# Patient Record
Sex: Female | Born: 1965 | Race: White | Hispanic: No | Marital: Single | Smoking: Never smoker
Health system: Southern US, Community
[De-identification: ages and names within clinical notes are randomized; demographics above are authoritative.]

## PROBLEM LIST (undated history)

## (undated) DIAGNOSIS — E0590 Thyrotoxicosis, unspecified without thyrotoxic crisis or storm: Secondary | ICD-10-CM

## (undated) DIAGNOSIS — D649 Anemia, unspecified: Secondary | ICD-10-CM

## (undated) DIAGNOSIS — Z800 Family history of malignant neoplasm of digestive organs: Secondary | ICD-10-CM

## (undated) DIAGNOSIS — Z9109 Other allergy status, other than to drugs and biological substances: Secondary | ICD-10-CM

## (undated) DIAGNOSIS — K760 Fatty (change of) liver, not elsewhere classified: Secondary | ICD-10-CM

## (undated) DIAGNOSIS — J45909 Unspecified asthma, uncomplicated: Secondary | ICD-10-CM

## (undated) DIAGNOSIS — R198 Other specified symptoms and signs involving the digestive system and abdomen: Secondary | ICD-10-CM

## (undated) DIAGNOSIS — R911 Solitary pulmonary nodule: Secondary | ICD-10-CM

## (undated) DIAGNOSIS — K219 Gastro-esophageal reflux disease without esophagitis: Secondary | ICD-10-CM

## (undated) DIAGNOSIS — E7800 Pure hypercholesterolemia, unspecified: Secondary | ICD-10-CM

## (undated) DIAGNOSIS — Z0000 Encounter for general adult medical examination without abnormal findings: Secondary | ICD-10-CM

## (undated) DIAGNOSIS — K8020 Calculus of gallbladder without cholecystitis without obstruction: Secondary | ICD-10-CM

## (undated) DIAGNOSIS — J069 Acute upper respiratory infection, unspecified: Secondary | ICD-10-CM

## (undated) DIAGNOSIS — E039 Hypothyroidism, unspecified: Secondary | ICD-10-CM

## (undated) DIAGNOSIS — R42 Dizziness and giddiness: Secondary | ICD-10-CM

## (undated) HISTORY — DX: Gastro-esophageal reflux disease without esophagitis: K21.9

## (undated) HISTORY — DX: Thyrotoxicosis, unspecified without thyrotoxic crisis or storm: E05.90

## (undated) HISTORY — DX: Other allergy status, other than to drugs and biological substances: Z91.09

## (undated) HISTORY — PX: NO PAST SURGERIES: SHX2092

## (undated) HISTORY — DX: Family history of malignant neoplasm of digestive organs: Z80.0

## (undated) HISTORY — DX: Acute upper respiratory infection, unspecified: J06.9

## (undated) HISTORY — DX: Fatty (change of) liver, not elsewhere classified: K76.0

## (undated) HISTORY — DX: Encounter for general adult medical examination without abnormal findings: Z00.00

## (undated) HISTORY — DX: Pure hypercholesterolemia, unspecified: E78.00

## (undated) HISTORY — DX: Other specified symptoms and signs involving the digestive system and abdomen: R19.8

## (undated) HISTORY — DX: Calculus of gallbladder without cholecystitis without obstruction: K80.20

## (undated) HISTORY — DX: Solitary pulmonary nodule: R91.1

## (undated) HISTORY — DX: Anemia, unspecified: D64.9

## (undated) HISTORY — DX: Dizziness and giddiness: R42

## (undated) HISTORY — DX: Unspecified asthma, uncomplicated: J45.909

## (undated) HISTORY — DX: Hypothyroidism, unspecified: E03.9

---

## 2004-04-24 ENCOUNTER — Ambulatory Visit: Payer: Self-pay | Admitting: Internal Medicine

## 2004-12-26 ENCOUNTER — Ambulatory Visit: Payer: Self-pay | Admitting: Gastroenterology

## 2005-06-12 ENCOUNTER — Ambulatory Visit: Payer: Self-pay | Admitting: Internal Medicine

## 2006-08-29 ENCOUNTER — Ambulatory Visit: Payer: Self-pay | Admitting: Internal Medicine

## 2007-09-09 ENCOUNTER — Ambulatory Visit: Payer: Self-pay | Admitting: Internal Medicine

## 2008-10-12 ENCOUNTER — Ambulatory Visit: Payer: Self-pay | Admitting: Internal Medicine

## 2009-09-14 ENCOUNTER — Ambulatory Visit: Payer: Self-pay | Admitting: Gastroenterology

## 2009-12-15 ENCOUNTER — Ambulatory Visit: Payer: Self-pay | Admitting: Internal Medicine

## 2010-12-18 ENCOUNTER — Ambulatory Visit: Payer: Self-pay | Admitting: Internal Medicine

## 2012-01-23 ENCOUNTER — Telehealth: Payer: Self-pay | Admitting: Internal Medicine

## 2012-01-23 ENCOUNTER — Ambulatory Visit (INDEPENDENT_AMBULATORY_CARE_PROVIDER_SITE_OTHER): Payer: Commercial Managed Care - PPO | Admitting: Internal Medicine

## 2012-01-23 ENCOUNTER — Encounter: Payer: Self-pay | Admitting: Internal Medicine

## 2012-01-23 VITALS — BP 126/75 | HR 69 | Temp 98.0°F | Ht 60.0 in | Wt 188.2 lb

## 2012-01-23 DIAGNOSIS — E039 Hypothyroidism, unspecified: Secondary | ICD-10-CM

## 2012-01-23 DIAGNOSIS — J069 Acute upper respiratory infection, unspecified: Secondary | ICD-10-CM

## 2012-01-23 MED ORDER — MONTELUKAST SODIUM 10 MG PO TABS: 10.0000 mg | ORAL_TABLET | Freq: Every day | ORAL | Status: DC

## 2012-01-23 MED ORDER — PREDNISONE 10 MG PO TABS: 10.0000 mg | ORAL_TABLET | Freq: Every day | ORAL | Status: DC

## 2012-01-23 MED ORDER — FLUTICASONE PROPIONATE 50 MCG/ACT NA SUSP: 2.0000 | Freq: Every day | NASAL | Status: DC

## 2012-01-23 MED ORDER — AMOXICILLIN-POT CLAVULANATE 875-125 MG PO TABS: 1.0000 | ORAL_TABLET | Freq: Two times a day (BID) | ORAL | Status: DC

## 2012-01-23 NOTE — Patient Instructions (Addendum)
It was nice seeing you today.  I am sorry you have not been feeling well.  I have sent your prescriptions to your pharmacy.  Let me know if any problems.

## 2012-01-23 NOTE — Telephone Encounter (Signed)
Pt called wanted to see if she could come in asap. She stated she has asthma    She is having a lot of chest congestion. Wheezing, she has to use her inhaler a lot more lately

## 2012-01-23 NOTE — Telephone Encounter (Signed)
Have her come on over now.

## 2012-01-27 ENCOUNTER — Encounter: Payer: Self-pay | Admitting: Internal Medicine

## 2012-01-27 DIAGNOSIS — E039 Hypothyroidism, unspecified: Secondary | ICD-10-CM

## 2012-01-27 HISTORY — DX: Hypothyroidism, unspecified: E03.9

## 2012-01-27 NOTE — Assessment & Plan Note (Signed)
On replacement.  Follow tsh.  

## 2012-01-27 NOTE — Progress Notes (Signed)
  Subjective:    Patient ID: Bethany Cook, female    DOB: 08-25-1965, 46 y.o.   MRN: [id number]  HPI 46 year old female with past history of asthma and hypothyroidism who comes in today as a work in with concerns regarding increased cough and congestion.  She noticed some changes with the weather change.  Subjective fever.  Taking mucinex the last several days.  Some increased sinus pressure, nasal congestion.  Increased cough and wheezing.  No significant chest tightness.  No vomiting or diarrhea.    Past Medical History  Diagnosis Date  . Asthma   . Hypothyroidism     Review of Systems Patient denies any headache, lightheadedness or dizziness.  Some sinus pressure and nasal congestion.  Some drainage.  No chest pain, tightness or palpitations.  No increased shortness of breath.  Does have the cough as outlined.  No vomiting.  No abdominal pain or cramping.  No bowel change, such as diarrhea, constipation, BRBPR or melana.  No urine change.        Objective:   Physical Exam Filed Vitals:   01/23/12 1615  BP: 126/75  Pulse: 69  Temp: 98 F (80.79 C)   46 year old female in no acute distress.   HEENT:  Nares - erythematous turbinates.  Minimal tenderness to palpation over the sinuses.  TMs visualized without erythema.   OP- without lesions or erythema.  NECK:  Supple, nontender.   HEART:  Appears to be regular. LUNGS:  Without crackles.  Some wheezing noted.  Increased air movement.  Some increased cough with forced expiration.   Respirations even and unlabored.       Assessment & Plan:  SINUSITIS/URI.  Known asthma.  Treat with Augmentin 875mg  bid x 10 days.  Flonase nasal spray and saline nasal flushes as directed.   Mucinex DM in the am and Robitussin DM in the evening.  Prednisone taper starting at 40mg  and decreasing by 5mg  each day until down to zero mg.  Pulmicort inhaler as directed.  Rescue inhaler and nebs as needed.  Follow.  Notify me if symptoms do not resolve.

## 2012-04-01 ENCOUNTER — Encounter: Payer: Self-pay | Admitting: Internal Medicine

## 2012-04-01 ENCOUNTER — Other Ambulatory Visit (HOSPITAL_COMMUNITY)
Admission: RE | Admit: 2012-04-01 | Discharge: 2012-04-01 | Disposition: A | Discharge status: Home / Self Care | Payer: Commercial Managed Care - PPO | Source: Ambulatory Visit | Attending: Internal Medicine | Admitting: Internal Medicine

## 2012-04-01 ENCOUNTER — Ambulatory Visit (INDEPENDENT_AMBULATORY_CARE_PROVIDER_SITE_OTHER): Payer: Commercial Managed Care - PPO | Admitting: Internal Medicine

## 2012-04-01 VITALS — BP 122/68 | HR 80 | Temp 98.7°F | Ht 60.0 in | Wt 190.5 lb

## 2012-04-01 DIAGNOSIS — J45909 Unspecified asthma, uncomplicated: Secondary | ICD-10-CM | POA: Insufficient documentation

## 2012-04-01 DIAGNOSIS — D649 Anemia, unspecified: Secondary | ICD-10-CM

## 2012-04-01 DIAGNOSIS — E7800 Pure hypercholesterolemia, unspecified: Secondary | ICD-10-CM

## 2012-04-01 DIAGNOSIS — R5383 Other fatigue: Secondary | ICD-10-CM

## 2012-04-01 DIAGNOSIS — Z1151 Encounter for screening for human papillomavirus (HPV): Secondary | ICD-10-CM | POA: Insufficient documentation

## 2012-04-01 DIAGNOSIS — Z139 Encounter for screening, unspecified: Secondary | ICD-10-CM

## 2012-04-01 DIAGNOSIS — Z01419 Encounter for gynecological examination (general) (routine) without abnormal findings: Secondary | ICD-10-CM | POA: Insufficient documentation

## 2012-04-01 DIAGNOSIS — R5381 Other malaise: Secondary | ICD-10-CM

## 2012-04-01 DIAGNOSIS — E039 Hypothyroidism, unspecified: Secondary | ICD-10-CM

## 2012-04-01 LAB — CBC WITH DIFFERENTIAL/PLATELET
Basophils Relative: 1.2 % (ref 0.0–3.0)
Eosinophils Absolute: 0.8 10*3/uL — ABNORMAL HIGH (ref 0.0–0.7)
Lymphs Abs: 1.4 10*3/uL (ref 0.7–4.0)
MCHC: 33.9 g/dL (ref 30.0–36.0)
MCV: 87.7 fl (ref 78.0–100.0)
Monocytes Absolute: 0.6 10*3/uL (ref 0.1–1.0)
Neutrophils Relative %: 66 % (ref 43.0–77.0)
Platelets: 285 10*3/uL (ref 150.0–400.0)

## 2012-04-01 LAB — FERRITIN: Ferritin: 36 ng/mL (ref 10.0–291.0)

## 2012-04-01 LAB — LIPID PANEL
HDL: 42.5 mg/dL (ref >39.00)
Triglycerides: 116 mg/dL (ref 0.0–149.0)
VLDL: 23.2 mg/dL (ref 0.0–40.0)

## 2012-04-01 LAB — COMPREHENSIVE METABOLIC PANEL
AST: 19 U/L (ref 0–37)
Albumin: 4.1 g/dL (ref 3.5–5.2)
Alkaline Phosphatase: 63 U/L (ref 39–117)
BUN: 10 mg/dL (ref 6–23)
Potassium: 4.2 mEq/L (ref 3.5–5.1)
Sodium: 136 mEq/L (ref 135–145)
Total Bilirubin: 0.5 mg/dL (ref 0.3–1.2)

## 2012-04-01 LAB — TSH: TSH: 0.87 u[IU]/mL (ref 0.35–5.50)

## 2012-04-01 LAB — LDL CHOLESTEROL, DIRECT: Direct LDL: 141.3 mg/dL

## 2012-04-01 MED ORDER — AMOXICILLIN-POT CLAVULANATE 875-125 MG PO TABS: 1.0000 | ORAL_TABLET | Freq: Two times a day (BID) | ORAL | Status: DC

## 2012-04-01 MED ORDER — PREDNISONE 10 MG PO TABS: ORAL_TABLET | ORAL | Status: DC

## 2012-04-06 ENCOUNTER — Encounter: Payer: Self-pay | Admitting: Internal Medicine

## 2012-04-06 NOTE — Assessment & Plan Note (Signed)
Treat the acute flare as outlined.  Follow.

## 2012-04-06 NOTE — Assessment & Plan Note (Signed)
Colonoscopy as outlined above.  EGD 09/14/09 - normal.  Follow.  Recheck cbc today.

## 2012-04-06 NOTE — Assessment & Plan Note (Signed)
Low cholesterol diet and exercise.  Check lipid panel.   

## 2012-04-06 NOTE — Assessment & Plan Note (Signed)
On thyroid replacement.  Check tsh.  

## 2012-04-06 NOTE — Progress Notes (Signed)
Subjective:    Patient ID: Bethany Cook, female    DOB: 03/22/1965, 47 y.o.   MRN: [id number]  HPI 47 year old female with past history of asthma and hypothyroidism who comes in today to follow up on these issues as well as for a complete physical exam.  She states she has been doing relatively well overall.  She has had issues with increased cough and congestion recently.  Using her neb at night and her rescue inhaler 3-4x/day.  Occasionally productive.  Some increased wheezing.  No nausea or vomiting.  No bowel change.  Handling stress well.    Past Medical History  Diagnosis Date  . Asthma   . Hyperthyroidism     s/p ablation  . Environmental allergies   . Anemia     iron deficient  . Hypercholesterolemia     Current Outpatient Prescriptions on File Prior to Visit  Medication Sig Dispense Refill  . amoxicillin-clavulanate (AUGMENTIN) 875-125 MG per tablet Take 1 tablet by mouth 2 (two) times daily.  20 tablet  0  . fexofenadine (ALLEGRA) 180 MG tablet Take 180 mg by mouth daily.      . fluticasone (FLONASE) 50 MCG/ACT nasal spray Place 2 sprays into the nose daily.  16 g  1  . fluticasone (FLOVENT HFA) 110 MCG/ACT inhaler Inhale 2 puffs into the lungs 2 (two) times daily.      Marland Kitchen levalbuterol (XOPENEX HFA) 45 MCG/ACT inhaler Inhale 2 puffs into the lungs every 6 (six) hours as needed.      Marland Kitchen levothyroxine (SYNTHROID, LEVOTHROID) 125 MCG tablet Take 125 mcg by mouth daily.      . montelukast (SINGULAIR) 10 MG tablet Take 1 tablet (10 mg total) by mouth at bedtime.  90 tablet  3  . predniSONE (DELTASONE) 10 MG tablet Take 1 tablet (10 mg total) by mouth daily. Take 4 tablets x 1 day and then decrease by 1/2 tablet each day until down to zero mg.  18 tablet  0    Review of Systems Patient denies any headache, lightheadedness or dizziness.  No significant sinus symptoms.   No chest pain, tightness or palpitations.  Does have the cough as outlined.  Increased wheezing.  using her inhalers  and neb more.  No vomiting.  No abdominal pain or cramping.  No bowel change, such as diarrhea, constipation, BRBPR or melana.  No urine change.  LMP - this months.       Objective:   Physical Exam  Filed Vitals:   04/01/12 0801  BP: 122/68  Pulse: 80  Temp: 98.7 F (53.74 C)   47 year old female in no acute distress.   HEENT:  Nares- clear.  Oropharynx - without lesions. NECK:  Supple.  Nontender.  No audible bruit.  HEART:  Appears to be regular. LUNGS:  No crackles or wheezing audible.  Respirations even and unlabored.  RADIAL PULSE:  Equal bilaterally.    BREASTS:  No nipple discharge or nipple retraction present.  Could not appreciate any distinct nodules or axillary adenopathy.  ABDOMEN:  Soft, nontender.  Bowel sounds present and normal.  No audible abdominal bruit.  GU:  Normal external genitalia.  Vaginal vault without lesions.  Cervix identified.  Pap performed. Could not appreciate any adnexal masses or tenderness.   RECTAL:  Heme negative.   EXTREMITIES:  No increased edema present.  DP pulses palpable and equal bilaterally.          Assessment & Plan:  URI.  Known asthma.  Flonase nasal spray and saline nasal flushes as directed.   Mucinex DM in the am and Robitussin DM in the evening.  Prednisone taper starting at 40mg  and decreasing by 5mg  each day until down to zero mg.  Pulmicort inhaler as directed.  Rescue inhaler and nebs as needed.  Follow.  Notify me if symptoms do not resolve.    PREVIOUS HEMATURIA.  Follow.   INCREASED PSYCHOSOCIAL STRESSORS.  Feels she is handling things well.  Follow.    FATIGUE.  Overall doing well.  Will check cbc,met c and ts.   HEALTH MAINTENANCE.  Physical today.  Schedule mammogram.  Colonoscopy 09/14/09 - diverticulosis otherwise normal.  Pap today.

## 2012-04-07 ENCOUNTER — Encounter: Payer: Self-pay | Admitting: Internal Medicine

## 2012-04-15 ENCOUNTER — Ambulatory Visit: Payer: Self-pay | Admitting: Internal Medicine

## 2012-04-26 ENCOUNTER — Other Ambulatory Visit: Payer: Self-pay

## 2012-04-28 ENCOUNTER — Encounter: Payer: Self-pay | Admitting: Internal Medicine

## 2012-05-05 ENCOUNTER — Encounter: Payer: Self-pay | Admitting: Internal Medicine

## 2012-05-08 ENCOUNTER — Telehealth: Payer: Self-pay | Admitting: Internal Medicine

## 2012-05-08 MED ORDER — LEVOTHYROXINE SODIUM 112 MCG PO TABS: 112.0000 ug | ORAL_TABLET | Freq: Every day | ORAL | Status: DC

## 2012-05-08 NOTE — Telephone Encounter (Signed)
rx sent in for levothyroxine

## 2012-06-13 ENCOUNTER — Ambulatory Visit (INDEPENDENT_AMBULATORY_CARE_PROVIDER_SITE_OTHER): Payer: Commercial Managed Care - PPO | Admitting: Internal Medicine

## 2012-06-13 ENCOUNTER — Telehealth: Payer: Self-pay | Admitting: Internal Medicine

## 2012-06-13 ENCOUNTER — Encounter: Payer: Self-pay | Admitting: Internal Medicine

## 2012-06-13 VITALS — BP 128/78 | HR 84 | Temp 99.0°F | Resp 18 | Wt 188.8 lb

## 2012-06-13 DIAGNOSIS — J45909 Unspecified asthma, uncomplicated: Secondary | ICD-10-CM

## 2012-06-13 MED ORDER — PREDNISONE 10 MG PO TABS: ORAL_TABLET | ORAL | Status: DC

## 2012-06-13 MED ORDER — ALBUTEROL SULFATE (2.5 MG/3ML) 0.083% IN NEBU
2.5000 mg | INHALATION_SOLUTION | Freq: Once | RESPIRATORY_TRACT | Status: AC
  Administered 2012-06-13: 2.5 mg via RESPIRATORY_TRACT

## 2012-06-13 MED ORDER — AMOXICILLIN-POT CLAVULANATE 875-125 MG PO TABS: 1.0000 | ORAL_TABLET | Freq: Two times a day (BID) | ORAL | Status: DC

## 2012-06-13 NOTE — Telephone Encounter (Signed)
Having a lot of congestion and breathing problems.  States Dr. Lorin Picket told her to let her know when this happens.  Dr. Lorin Picket has no openings this afternoon.  Pt refused to see NP and wants to hear back from Dr. Lorin Picket whether she can add her on today or if Dr. Lorin Picket will just call in her prednisone.  Please advise.

## 2012-06-13 NOTE — Telephone Encounter (Signed)
See if she can come on in now.

## 2012-06-13 NOTE — Telephone Encounter (Signed)
Called patient to see if she could come on in. Patient stated that she would be here.

## 2012-06-15 ENCOUNTER — Encounter: Payer: Self-pay | Admitting: Internal Medicine

## 2012-06-15 NOTE — Assessment & Plan Note (Signed)
Treat the underlying infection.  Follow.

## 2012-06-15 NOTE — Progress Notes (Addendum)
Subjective:    Patient ID: Bethany Cook, female    DOB: 1965/06/26, 47 y.o.   MRN: [id number]  Shortness of Breath  47 year old female with past history of asthma and hypothyroidism who comes in today as a work in with concerns regarding increased cough and congestion.  States symptoms started one week ago.  Increased chest congestion and cough.  Increased wheezing.  Taking mucinex for the last three days. Using her steroid inhaler bid.  Using her xopenx prn.  Using her neb at home - once a day for the last two days.  Some increased nasal congestion and drainage.     Past Medical History  Diagnosis Date  . Asthma   . Hyperthyroidism     s/p ablation  . Environmental allergies   . Anemia     iron deficient  . Hypercholesterolemia      Current Outpatient Prescriptions on File Prior to Visit  Medication Sig Dispense Refill  . ALPRAZolam (XANAX) 0.25 MG tablet Take 0.25 mg by mouth daily as needed.      . fexofenadine (ALLEGRA) 180 MG tablet Take 180 mg by mouth daily.      . fluticasone (FLONASE) 50 MCG/ACT nasal spray Place 2 sprays into the nose daily.  16 g  1  . fluticasone (FLOVENT HFA) 110 MCG/ACT inhaler Inhale 2 puffs into the lungs 2 (two) times daily.      Marland Kitchen levalbuterol (XOPENEX HFA) 45 MCG/ACT inhaler Inhale 2 puffs into the lungs every 6 (six) hours as needed.      Marland Kitchen levothyroxine (SYNTHROID) 112 MCG tablet Take 1 tablet (112 mcg total) by mouth daily.  90 tablet  3  . montelukast (SINGULAIR) 10 MG tablet Take 1 tablet (10 mg total) by mouth at bedtime.  90 tablet  3  . predniSONE (DELTASONE) 10 MG tablet Take 1 tablet (10 mg total) by mouth daily. Take 4 tablets x 1 day and then decrease by 1/2 tablet each day until down to zero mg.  18 tablet  0   No current facility-administered medications on file prior to visit.    Review of Systems  Respiratory: Positive for shortness of breath.   Patient denies any headache, lightheadedness or dizziness.  Some sinus pressure and  nasal congestion.  Some drainage.  No chest pain or palpitations.  Does have the cough as outlined.  Increased congestion and wheezing.  No acid reflux.   No vomiting.  No abdominal pain or cramping.  No bowel change, such as diarrhea, constipation, BRBPR or melana.  No urine change.        Objective:   Physical Exam  Filed Vitals:   06/13/12 1133  BP: 128/78  Pulse: 84  Temp: 99 F (37.2 C)  Resp: 45   47 year old female in no acute distress.   HEENT:  Nares - erythematous turbinates.  Minimal tenderness to palpation over the sinuses.  TMs visualized without erythema.   OP- without lesions or erythema.  NECK:  Supple, nontender.   HEART:  Appears to be regular. LUNGS:  Without crackles.  Some wheezing noted.  Increased air movement.  Some increased cough with forced expiration.         Assessment & Plan:  SINUSITIS/URI.  Known asthma.  Was given an albuterol neg here in the office.  Post neb pulse ox 98%.  Increased air movement.  Treat with Augmentin 875mg  bid x 10 days.  Flonase nasal spray and saline nasal flushes as directed.  Mucinex DM in the am and Robitussin DM in the evening.  Prednisone taper starting at 40mg  and decreasing by 5mg  each day until down to zero mg.  Pulmicort inhaler as directed.  Rescue inhaler and nebs as needed.  Follow.  Notify me if symptoms do not resolve.

## 2012-07-23 ENCOUNTER — Encounter: Payer: Self-pay | Admitting: Internal Medicine

## 2012-07-23 ENCOUNTER — Other Ambulatory Visit: Payer: Self-pay | Admitting: *Deleted

## 2012-07-23 MED ORDER — LEVALBUTEROL TARTRATE 45 MCG/ACT IN AERO: 2.0000 | INHALATION_SPRAY | Freq: Four times a day (QID) | RESPIRATORY_TRACT | Status: DC | PRN

## 2012-07-28 ENCOUNTER — Encounter: Payer: Self-pay | Admitting: Internal Medicine

## 2012-07-28 ENCOUNTER — Ambulatory Visit (INDEPENDENT_AMBULATORY_CARE_PROVIDER_SITE_OTHER): Payer: Commercial Managed Care - PPO | Admitting: Internal Medicine

## 2012-07-28 ENCOUNTER — Telehealth: Payer: Self-pay | Admitting: Internal Medicine

## 2012-07-28 ENCOUNTER — Ambulatory Visit: Payer: Self-pay | Admitting: Adult Health

## 2012-07-28 VITALS — BP 130/80 | HR 79 | Temp 99.6°F | Ht 60.0 in | Wt 189.2 lb

## 2012-07-28 DIAGNOSIS — R062 Wheezing: Secondary | ICD-10-CM

## 2012-07-28 DIAGNOSIS — J45909 Unspecified asthma, uncomplicated: Secondary | ICD-10-CM

## 2012-07-28 MED ORDER — ALBUTEROL SULFATE (2.5 MG/3ML) 0.083% IN NEBU
2.5000 mg | INHALATION_SOLUTION | Freq: Once | RESPIRATORY_TRACT | Status: AC
  Administered 2012-07-28: 2.5 mg via RESPIRATORY_TRACT

## 2012-07-28 MED ORDER — AMOXICILLIN-POT CLAVULANATE 875-125 MG PO TABS: 1.0000 | ORAL_TABLET | Freq: Two times a day (BID) | ORAL | Status: DC

## 2012-07-28 MED ORDER — PREDNISONE 10 MG PO TABS: ORAL_TABLET | ORAL | Status: DC

## 2012-07-28 NOTE — Telephone Encounter (Signed)
Patient Information:  Caller Name: Laycee  Phone: [phone]  Patient: Bethany, Cook  Gender: Female  DOB: 03/08/1966  Age: 47 Years  PCP: Dale Bell [facility]le  Pregnant: No  Office Follow Up:  Does the office need to follow up with this patient?: Yes  Instructions For The Office: PLEASE CALL PT BACK FOR SOONER APPT THAN 4PM ON 5-19 IF POSSIBLE DUE TO WHEEZING W/ ASTHMA  RN Note:  Next available appt scheduled  Symptoms  Reason For Call & Symptoms: ER CALL. Wheezing, Asthma Flare up  Reviewed Health History In EMR: N/A  Reviewed Medications In EMR: N/A  Reviewed Allergies In EMR: N/A  Reviewed Surgeries / Procedures: N/A  Date of Onset of Symptoms: 07/26/2012  Treatments Tried: Xopenex, Albuterol neb and Plumicort  Treatments Tried Worked: No OB / GYN:  LMP: Unknown  Guideline(s) Used:  Asthma Attack  Disposition Per Guideline:   Go to Office Now  Reason For Disposition Reached:   Moderate asthma attack (e.g., SOB at rest, speaks in phrases, audible wheezes) and not resolved after 2 nebulizer or inhaler treatments given 20 minutes apart  Advice Given:  N/A  Patient Will Follow Care Advice:  YES  Appointment Scheduled:  07/28/2012 16:00:00 Appointment Scheduled Provider:  Orville Govern

## 2012-07-29 ENCOUNTER — Encounter: Payer: Self-pay | Admitting: Internal Medicine

## 2012-07-29 ENCOUNTER — Ambulatory Visit (INDEPENDENT_AMBULATORY_CARE_PROVIDER_SITE_OTHER): Payer: Commercial Managed Care - PPO | Admitting: Internal Medicine

## 2012-07-29 VITALS — BP 130/90 | HR 86 | Temp 98.7°F | Ht 60.0 in | Wt 189.2 lb

## 2012-07-29 DIAGNOSIS — J45909 Unspecified asthma, uncomplicated: Secondary | ICD-10-CM

## 2012-07-29 NOTE — Progress Notes (Signed)
Subjective:    Patient ID: Bethany Cook, female    DOB: May 07, 1965, 47 y.o.   MRN: [id number]  Wheezing  Associated symptoms include shortness of breath.  Shortness of Breath Associated symptoms include wheezing.  47 year old female with past history of asthma and hypothyroidism who comes in today as a work in with concerns regarding increased cough and congestion and wheezing.  States symptoms started three days ago.  Increased chest congestion and cough.  Increased wheezing.  Taking mucinex for the last three days. Using her steroid inhaler bid.  Using her xopenx prn.  Using her neb at home.  Some increased nasal congestion and drainage.  Chest feels tight.  No vomiting.     Past Medical History  Diagnosis Date  . Asthma   . Hyperthyroidism     s/p ablation  . Environmental allergies   . Anemia     iron deficient  . Hypercholesterolemia      Current Outpatient Prescriptions on File Prior to Visit  Medication Sig Dispense Refill  . ALPRAZolam (XANAX) 0.25 MG tablet Take 0.25 mg by mouth daily as needed.      . fexofenadine (ALLEGRA) 180 MG tablet Take 180 mg by mouth daily.      . fluticasone (FLONASE) 50 MCG/ACT nasal spray Place 2 sprays into the nose daily.  16 g  1  . levalbuterol (XOPENEX HFA) 45 MCG/ACT inhaler Inhale 2 puffs into the lungs every 6 (six) hours as needed.  3 Inhaler  0  . levothyroxine (SYNTHROID) 112 MCG tablet Take 1 tablet (112 mcg total) by mouth daily.  90 tablet  3  . montelukast (SINGULAIR) 10 MG tablet Take 1 tablet (10 mg total) by mouth at bedtime.  90 tablet  3  . fluticasone (FLOVENT HFA) 110 MCG/ACT inhaler Inhale 2 puffs into the lungs 2 (two) times daily.       No current facility-administered medications on file prior to visit.    Review of Systems  Respiratory: Positive for shortness of breath and wheezing.   Patient denies any headache, lightheadedness or dizziness.  Some sinus pressure and nasal congestion.  Some drainage.  No chest pain  or palpitations.  Does have the cough as outlined.  Increased congestion and wheezing.  Chest tightness.  No acid reflux.   No vomiting.  No abdominal pain or cramping.  No bowel change, such as diarrhea, constipation, BRBPR or melana.  No urine change.        Objective:   Physical Exam  Filed Vitals:   07/28/12 1528  BP: 130/80  Pulse: 79  Temp: 99.6 F (20.45 C)   47 year old female in no acute distress.   HEENT:  Nares - erythematous turbinates.  Minimal tenderness to palpation over the sinuses.  TMs visualized without erythema.   OP- without lesions or erythema.  NECK:  Supple, nontender.   HEART:  Appears to be regular. LUNGS:  Without crackles.  Some wheezing noted.  Increased congestion and cough.  Some increased cough with forced expiration.         Assessment & Plan:  SINUSITIS/URI.  Known asthma.  Was given an albuterol neg here in the office.  Post neb pulse ox 97%.  Increased air movement.  Treat with Augmentin 875mg  bid x 10 days.  Flonase nasal spray and saline nasal flushes as directed.   Mucinex DM in the am and Robitussin DM in the evening.  Prednisone taper starting at 40mg  and decreasing by 5mg   each day until down to zero mg.  Pulmicort inhaler as directed.  Rescue inhaler and nebs as needed.  Follow.  Notify me if symptoms do not resolve.  Will reevaluate her tomorrow to confirm improvement.

## 2012-07-29 NOTE — Assessment & Plan Note (Signed)
Treat current infection and flare.  Follow.

## 2012-08-03 ENCOUNTER — Encounter: Payer: Self-pay | Admitting: Internal Medicine

## 2012-08-03 NOTE — Assessment & Plan Note (Signed)
Treating the acute flare as outlined.  Follow.  Breathing better.

## 2012-08-03 NOTE — Progress Notes (Signed)
Subjective:    Patient ID: Bethany Cook, female    DOB: [birth year], 47 y.o.   MRN: [id number]  Wheezing  Associated symptoms include shortness of breath.  Shortness of Breath Associated symptoms include wheezing.  47 year old female with past history of asthma and hypothyroidism who comes in today for a follow up regarding increased cough and congestion and wheezing. She was seen yesterday.  Started on prednisone and abx.  Using her nebulizer and inhalers.  Feels better.  Breathing better.  Increased air movement.  No nausea or vomiting.  Now bowel change.    Past Medical History  Diagnosis Date  . Asthma   . Hyperthyroidism     s/p ablation  . Environmental allergies   . Anemia     iron deficient  . Hypercholesterolemia     Current Outpatient Prescriptions on File Prior to Visit  Medication Sig Dispense Refill  . ALPRAZolam (XANAX) 0.25 MG tablet Take 0.25 mg by mouth daily as needed.      Marland Kitchen amoxicillin-clavulanate (AUGMENTIN) 875-125 MG per tablet Take 1 tablet by mouth 2 (two) times daily.  20 tablet  0  . budesonide (PULMICORT) 180 MCG/ACT inhaler Inhale 1 puff into the lungs 2 (two) times daily.      . fexofenadine (ALLEGRA) 180 MG tablet Take 180 mg by mouth daily.      . fluticasone (FLONASE) 50 MCG/ACT nasal spray Place 2 sprays into the nose daily.  16 g  1  . fluticasone (FLOVENT HFA) 110 MCG/ACT inhaler Inhale 2 puffs into the lungs 2 (two) times daily.      Marland Kitchen levalbuterol (XOPENEX HFA) 45 MCG/ACT inhaler Inhale 2 puffs into the lungs every 6 (six) hours as needed.  3 Inhaler  0  . levothyroxine (SYNTHROID) 112 MCG tablet Take 1 tablet (112 mcg total) by mouth daily.  90 tablet  3  . montelukast (SINGULAIR) 10 MG tablet Take 1 tablet (10 mg total) by mouth at bedtime.  90 tablet  3  . predniSONE (DELTASONE) 10 MG tablet Take 4 tablets x 1 day and then decrease by 1/2 tablet per day until down to zero mg  18 tablet  0   No current facility-administered medications on file  prior to visit.    Review of Systems  Respiratory: Positive for shortness of breath and wheezing.   Patient denies any headache, lightheadedness or dizziness.  Some sinus pressure and nasal congestion.  Some drainage.  No chest pain or palpitations.  Cough is better.  Breathing better.   No acid reflux.   No vomiting.  No abdominal pain or cramping.  No bowel change, such as diarrhea, constipation, BRBPR or melana.        Objective:   Physical Exam  Filed Vitals:   07/29/12 1149  BP: 130/90  Pulse: 86  Temp: 98.7 F (79.26 C)   47 year old female in no acute distress.   HEENT:  Nares - erythematous turbinates.  TMs visualized without erythema.   OP- without lesions or erythema.  NECK:  Supple, nontender.   HEART:  Appears to be regular. LUNGS:  Without crackles.  Increased air movement.        Assessment & Plan:  SINUSITIS/URI.  Known asthma.  Was given an albuterol neg here in the office yesterday.   Was placed on Augmentin 875mg  bid.   Flonase nasal spray and saline nasal flushes as directed.   Mucinex DM in the am and Robitussin DM in the evening.  Prednisone taper starting at 40mg  and decreasing by 5mg  each day until down to zero mg.  Pulmicort inhaler as directed.  Rescue inhaler and nebs as needed.  Doing better now.  Breathing better.  Follow.

## 2012-09-23 ENCOUNTER — Encounter: Payer: Self-pay | Admitting: Internal Medicine

## 2012-09-23 ENCOUNTER — Other Ambulatory Visit: Payer: Self-pay | Admitting: *Deleted

## 2012-09-23 MED ORDER — LEVALBUTEROL TARTRATE 45 MCG/ACT IN AERO: 2.0000 | INHALATION_SPRAY | Freq: Four times a day (QID) | RESPIRATORY_TRACT | Status: DC | PRN

## 2012-09-30 ENCOUNTER — Ambulatory Visit: Payer: Commercial Managed Care - PPO | Admitting: Internal Medicine

## 2012-10-06 ENCOUNTER — Ambulatory Visit: Payer: Commercial Managed Care - PPO | Admitting: Internal Medicine

## 2012-10-08 ENCOUNTER — Ambulatory Visit (INDEPENDENT_AMBULATORY_CARE_PROVIDER_SITE_OTHER): Payer: Commercial Managed Care - PPO | Admitting: Internal Medicine

## 2012-10-08 ENCOUNTER — Encounter: Payer: Self-pay | Admitting: Internal Medicine

## 2012-10-08 VITALS — BP 120/80 | HR 73 | Temp 98.5°F | Ht 60.0 in | Wt 190.0 lb

## 2012-10-08 DIAGNOSIS — J45909 Unspecified asthma, uncomplicated: Secondary | ICD-10-CM

## 2012-10-08 DIAGNOSIS — D649 Anemia, unspecified: Secondary | ICD-10-CM

## 2012-10-08 DIAGNOSIS — E039 Hypothyroidism, unspecified: Secondary | ICD-10-CM

## 2012-10-08 DIAGNOSIS — E7800 Pure hypercholesterolemia, unspecified: Secondary | ICD-10-CM

## 2012-10-08 LAB — LIPID PANEL
Cholesterol: 214 mg/dL — ABNORMAL HIGH (ref 0–200)
HDL: 46.4 mg/dL (ref >39.00)
VLDL: 21.6 mg/dL (ref 0.0–40.0)

## 2012-10-08 NOTE — Progress Notes (Signed)
  Subjective:    Patient ID: Bethany Cook, female    DOB: Jul 23, 1965, 47 y.o.   MRN: [id number]  HPI 47 year old female with past history of asthma and hypothyroidism who comes in today for a scheduled follow up.   She states she has been doing relatively well overall.  She has had issues with increased cough and congestion recently.  Using her neb at night and her rescue inhaler 3-4x/day.  Occasionally productive.  Some increased wheezing.  No nausea or vomiting.  No bowel change.  Handling stress well.    Past Medical History  Diagnosis Date  . Asthma   . Hyperthyroidism     s/p ablation  . Environmental allergies   . Anemia     iron deficient  . Hypercholesterolemia     Current Outpatient Prescriptions on File Prior to Visit  Medication Sig Dispense Refill  . budesonide (PULMICORT) 180 MCG/ACT inhaler Inhale 1 puff into the lungs 2 (two) times daily.      . fexofenadine (ALLEGRA) 180 MG tablet Take 180 mg by mouth daily.      . fluticasone (FLONASE) 50 MCG/ACT nasal spray Place 2 sprays into the nose daily.  16 g  1  . fluticasone (FLOVENT HFA) 110 MCG/ACT inhaler Inhale 2 puffs into the lungs 2 (two) times daily.      Marland Kitchen levalbuterol (XOPENEX HFA) 45 MCG/ACT inhaler Inhale 2 puffs into the lungs every 6 (six) hours as needed.  3 Inhaler  0  . levothyroxine (SYNTHROID) 112 MCG tablet Take 1 tablet (112 mcg total) by mouth daily.  90 tablet  3  . montelukast (SINGULAIR) 10 MG tablet Take 1 tablet (10 mg total) by mouth at bedtime.  90 tablet  3   No current facility-administered medications on file prior to visit.    Review of Systems Patient denies any headache, lightheadedness or dizziness.  No significant sinus symptoms.   No chest pain, tightness or palpitations.  Does have the cough as outlined.  Increased wheezing.  using her inhalers and neb more.  No vomiting.  No abdominal pain or cramping.  No bowel change, such as diarrhea, constipation, BRBPR or melana.  No urine change.   LMP - this months.       Objective:   Physical Exam  Filed Vitals:   10/08/12 0757  BP: 120/80  Pulse: 73  Temp: 98.5 F (76.47 C)   47 year old female in no acute distress.   HEENT:  Nares- clear.  Oropharynx - without lesions. NECK:  Supple.  Nontender.  No audible bruit.  HEART:  Appears to be regular. LUNGS:  No crackles or wheezing audible.  Respirations even and unlabored.  RADIAL PULSE:  Equal bilaterally.   ABDOMEN:  Soft, nontender.  Bowel sounds present and normal.  No audible abdominal bruit.   EXTREMITIES:  No increased edema present.  DP pulses palpable and equal bilaterally.          Assessment & Plan:  PREVIOUS HEMATURIA.  Follow.   INCREASED PSYCHOSOCIAL STRESSORS.  Feels she is handling things well.  Follow.    HEALTH MAINTENANCE.  Physical 04/01/12.  Mammogram 04/15/12 - Birads I.  Colonoscopy 09/14/09 - diverticulosis otherwise normal.

## 2012-10-10 ENCOUNTER — Encounter: Payer: Self-pay | Admitting: Internal Medicine

## 2012-10-11 ENCOUNTER — Encounter: Payer: Self-pay | Admitting: Internal Medicine

## 2012-10-11 NOTE — Assessment & Plan Note (Signed)
On replacement.  Follow tsh.  Last tsh wnl.   

## 2012-10-11 NOTE — Assessment & Plan Note (Signed)
Colonoscopy as outlined above.  EGD 09/14/09 - normal.  Follow. Last hgb normal.

## 2012-10-11 NOTE — Assessment & Plan Note (Signed)
Despite her current med regimen, she is still having flares of intermittent cough and wheezing.  Will get her set up with an allergist for further evaluation and treatment.

## 2012-10-11 NOTE — Assessment & Plan Note (Signed)
Low cholesterol diet and exercise.  Check lipid panel.   

## 2012-10-20 ENCOUNTER — Encounter: Payer: Self-pay | Admitting: Internal Medicine

## 2012-10-29 ENCOUNTER — Other Ambulatory Visit: Payer: Self-pay | Admitting: *Deleted

## 2012-10-29 MED ORDER — LEVOTHYROXINE SODIUM 112 MCG PO TABS: 112.0000 ug | ORAL_TABLET | Freq: Every day | ORAL | Status: DC

## 2012-11-05 ENCOUNTER — Encounter: Payer: Self-pay | Admitting: Internal Medicine

## 2012-11-05 ENCOUNTER — Telehealth: Payer: Self-pay | Admitting: Internal Medicine

## 2012-11-05 NOTE — Telephone Encounter (Signed)
My chart message sent to assess pts clinical status.

## 2013-01-15 ENCOUNTER — Other Ambulatory Visit: Payer: Self-pay

## 2013-03-10 ENCOUNTER — Other Ambulatory Visit: Payer: Self-pay | Admitting: Internal Medicine

## 2013-04-02 ENCOUNTER — Other Ambulatory Visit: Payer: Self-pay | Admitting: *Deleted

## 2013-04-02 ENCOUNTER — Telehealth: Payer: Self-pay | Admitting: Internal Medicine

## 2013-04-02 MED ORDER — LEVOTHYROXINE SODIUM 112 MCG PO TABS: 112.0000 ug | ORAL_TABLET | Freq: Every day | ORAL | Status: DC

## 2013-04-02 NOTE — Telephone Encounter (Signed)
Pt states she has called several times regarding her levothyroxine.  She has called her mail order, [facility], and they do not have it.  She states the pharmacy did not have it but told her they would contact us today with the request.  Pt states she does not have enough left to get her through until it comes through mail.  Asking for samples or if a short supply can be called to [facility].  Pt asking for a call to let her know what can be done.

## 2013-04-02 NOTE — Telephone Encounter (Signed)
Left detailed message to notify pt that her Rx was sent to pharmacy on 10/29/12, however I called [facility] they did not fill it for whatever reason. They are going to rush it to her. I sent in a [facility] day supply [facility]. I also notified patient that I never received any messages from her until today. Last form of communication received was a Clinical cytogeneticistmychart message in August.

## 2013-04-20 ENCOUNTER — Encounter: Payer: Self-pay | Admitting: Internal Medicine

## 2013-04-20 ENCOUNTER — Other Ambulatory Visit: Payer: Self-pay | Admitting: Internal Medicine

## 2013-04-20 ENCOUNTER — Ambulatory Visit (INDEPENDENT_AMBULATORY_CARE_PROVIDER_SITE_OTHER): Payer: Commercial Managed Care - PPO | Admitting: Internal Medicine

## 2013-04-20 ENCOUNTER — Encounter: Payer: Self-pay | Admitting: Emergency Medicine

## 2013-04-20 VITALS — BP 118/80 | HR 74 | Temp 98.7°F | Ht 62.25 in | Wt 199.2 lb

## 2013-04-20 DIAGNOSIS — K219 Gastro-esophageal reflux disease without esophagitis: Secondary | ICD-10-CM

## 2013-04-20 DIAGNOSIS — J45909 Unspecified asthma, uncomplicated: Secondary | ICD-10-CM

## 2013-04-20 DIAGNOSIS — E7800 Pure hypercholesterolemia, unspecified: Secondary | ICD-10-CM

## 2013-04-20 DIAGNOSIS — Z1239 Encounter for other screening for malignant neoplasm of breast: Secondary | ICD-10-CM

## 2013-04-20 DIAGNOSIS — Z800 Family history of malignant neoplasm of digestive organs: Secondary | ICD-10-CM

## 2013-04-20 DIAGNOSIS — E039 Hypothyroidism, unspecified: Secondary | ICD-10-CM

## 2013-04-20 DIAGNOSIS — D649 Anemia, unspecified: Secondary | ICD-10-CM

## 2013-04-20 NOTE — Progress Notes (Signed)
Orders placed for labs

## 2013-04-20 NOTE — Progress Notes (Signed)
Pre-visit discussion using our clinic review tool. No additional management support is needed unless otherwise documented below in the visit note.  

## 2013-04-21 LAB — COMPREHENSIVE METABOLIC PANEL
ALT: 21 U/L (ref 0–35)
AST: 23 U/L (ref 0–37)
Albumin: 4 g/dL (ref 3.5–5.2)
Alkaline Phosphatase: 56 U/L (ref 39–117)
BUN: 8 mg/dL (ref 6–23)
CALCIUM: 9 mg/dL (ref 8.4–10.5)
CHLORIDE: 103 meq/L (ref 96–112)
CO2: 27 meq/L (ref 19–32)
Creatinine, Ser: 0.9 mg/dL (ref 0.4–1.2)
GFR: 72.38 mL/min (ref >60.00)
Glucose, Bld: 91 mg/dL (ref 70–99)
Potassium: 4 mEq/L (ref 3.5–5.1)
SODIUM: 138 meq/L (ref 135–145)
Total Bilirubin: 0.5 mg/dL (ref 0.3–1.2)
Total Protein: 7.6 g/dL (ref 6.0–8.3)

## 2013-04-21 LAB — CBC WITH DIFFERENTIAL/PLATELET
BASOS ABS: 0.1 10*3/uL (ref 0.0–0.1)
Basophils Relative: 0.7 % (ref 0.0–3.0)
Eosinophils Absolute: 0.2 10*3/uL (ref 0.0–0.7)
Eosinophils Relative: 1.7 % (ref 0.0–5.0)
HCT: 41.2 % (ref 36.0–46.0)
HEMOGLOBIN: 13.4 g/dL (ref 12.0–15.0)
LYMPHS PCT: 24.4 % (ref 12.0–46.0)
Lymphs Abs: 2.2 10*3/uL (ref 0.7–4.0)
MCHC: 32.4 g/dL (ref 30.0–36.0)
MCV: 88.6 fl (ref 78.0–100.0)
MONOS PCT: 7.3 % (ref 3.0–12.0)
Monocytes Absolute: 0.7 10*3/uL (ref 0.1–1.0)
NEUTROS ABS: 6 10*3/uL (ref 1.4–7.7)
Neutrophils Relative %: 65.9 % (ref 43.0–77.0)
Platelets: 322 10*3/uL (ref 150.0–400.0)
RBC: 4.66 Mil/uL (ref 3.87–5.11)
RDW: 13.2 % (ref 11.5–14.6)
WBC: 9.1 10*3/uL (ref 4.5–10.5)

## 2013-04-21 LAB — LIPID PANEL
Cholesterol: 220 mg/dL — ABNORMAL HIGH (ref 0–200)
HDL: 48.5 mg/dL (ref >39.00)
Total CHOL/HDL Ratio: 5
Triglycerides: 85 mg/dL (ref 0.0–149.0)
VLDL: 17 mg/dL (ref 0.0–40.0)

## 2013-04-21 LAB — LDL CHOLESTEROL, DIRECT: Direct LDL: 157.7 mg/dL

## 2013-04-21 LAB — TSH: TSH: 1.88 u[IU]/mL (ref 0.35–5.50)

## 2013-04-22 ENCOUNTER — Encounter: Payer: Self-pay | Admitting: Internal Medicine

## 2013-04-22 DIAGNOSIS — E7800 Pure hypercholesterolemia, unspecified: Secondary | ICD-10-CM

## 2013-04-25 ENCOUNTER — Encounter: Payer: Self-pay | Admitting: Internal Medicine

## 2013-04-25 DIAGNOSIS — K219 Gastro-esophageal reflux disease without esophagitis: Secondary | ICD-10-CM | POA: Insufficient documentation

## 2013-04-25 DIAGNOSIS — Z800 Family history of malignant neoplasm of digestive organs: Secondary | ICD-10-CM | POA: Insufficient documentation

## 2013-04-25 HISTORY — DX: Gastro-esophageal reflux disease without esophagitis: K21.9

## 2013-04-25 HISTORY — DX: Family history of malignant neoplasm of digestive organs: Z80.0

## 2013-04-25 NOTE — Assessment & Plan Note (Signed)
Breathing better on her current regimen.  Receiving allergy shots.  Follow.    

## 2013-04-25 NOTE — Assessment & Plan Note (Signed)
Reflux as outlined.  Better on generic prilosec.  Add ranitidine.  Follow.  EGD 2011 - normal.

## 2013-04-25 NOTE — Progress Notes (Signed)
  Subjective:    Patient ID: Bethany Cook, female    DOB: [birth year], 48 y.o.   MRN: [id number]  HPI 48 year old female with past history of asthma and hypothyroidism who comes in today to follow up on these issues as well as for a complete physical exam.  States her breathing has been better.  Receiving allergy shots now.  Off flonase.  Using pro air and symbicort.  On prilosec.  Still some occasional break through reflux.  No significant problems.   No nausea or vomiting.  No bowel change.  Some increased stress.  She did not want to discuss.  She feels she is handling things relatively well.  Does report some fatigue.  She has recently started going to the gym.     Past Medical History  Diagnosis Date  . Asthma   . Hyperthyroidism     s/p ablation  . Environmental allergies   . Anemia     iron deficient  . Hypercholesterolemia     Current Outpatient Prescriptions on File Prior to Visit  Medication Sig Dispense Refill  . levothyroxine (SYNTHROID) 112 MCG tablet Take 1 tablet (112 mcg total) by mouth daily.  7 tablet  0  . montelukast (SINGULAIR) 10 MG tablet Take 1 tablet (10 mg total) by mouth at bedtime.  90 tablet  1   No current facility-administered medications on file prior to visit.    Review of Systems Patient denies any headache, lightheadedness or dizziness.  No significant sinus symptoms.   No chest pain, tightness or palpitations.  Breathing better on above regimen.   No vomiting. Acid reflux as outlined.   No abdominal pain or cramping.  No bowel change, such as diarrhea, constipation, BRBPR or melana.  No urine change.  Increased stress.  Some fatigue.     Objective:   Physical Exam  Filed Vitals:   04/20/13 0840  BP: 118/80  Pulse: 74  Temp: 98.7 F (5237.751 C)   48 year old female in no acute distress.   HEENT:  Nares- clear.  Oropharynx - without lesions. NECK:  Supple.  Nontender.  No audible bruit.  HEART:  Appears to be regular. LUNGS:  No crackles or  wheezing audible.  Respirations even and unlabored.  RADIAL PULSE:  Equal bilaterally.    BREASTS:  No nipple discharge or nipple retraction present.  Could not appreciate any distinct nodules or axillary adenopathy.  ABDOMEN:  Soft, nontender.  Bowel sounds present and normal.  No audible abdominal bruit.  GU:  Not performed.    EXTREMITIES:  No increased edema present.  DP pulses palpable and equal bilaterally.          Assessment & Plan:  PREVIOUS HEMATURIA.  Follow.   INCREASED PSYCHOSOCIAL STRESSORS.  Feels she is handling things relatively well.  Follow.    HEALTH MAINTENANCE.  Physical today.  Pap negative with negative HPV - 04/01/12.  Mammogram 04/15/12 - Birads I.  Schedule a f/u mammogram.   Colonoscopy 09/14/09 - diverticulosis otherwise normal.

## 2013-04-25 NOTE — Assessment & Plan Note (Signed)
On replacement.  Follow tsh.  Last tsh wnl.   

## 2013-04-25 NOTE — Assessment & Plan Note (Signed)
Colonoscopy as outlined above.  EGD 09/14/09 - normal.  Follow.  Last hgb normal.  Follow.    

## 2013-04-25 NOTE — Assessment & Plan Note (Signed)
Father had colon cancer.  Colonoscopy 09/14/2009 - normal.  Due f/u colonoscopy in five years.

## 2013-04-25 NOTE — Assessment & Plan Note (Signed)
Low cholesterol diet and exercise.  Check lipid panel.   

## 2013-04-27 NOTE — Telephone Encounter (Signed)
Mailed unread [employer] message, requested pt to contact office or reply to her [employer] email message

## 2013-05-04 MED ORDER — PRAVASTATIN SODIUM 10 MG PO TABS: 10.0000 mg | ORAL_TABLET | Freq: Every day | ORAL | Status: DC

## 2013-05-04 NOTE — Addendum Note (Signed)
Addended by: Charm BargesSCOTT, Heavan Francom S on: 05/04/2013 04:00 PM   Modules accepted: Orders

## 2013-05-04 NOTE — Telephone Encounter (Signed)
Pt needs a non fasting lab appt in 6 weeks.  Please contact her with a lab appt date and time.  Thanks.

## 2013-05-13 ENCOUNTER — Other Ambulatory Visit: Payer: Self-pay | Admitting: Internal Medicine

## 2013-05-13 ENCOUNTER — Ambulatory Visit: Payer: Self-pay | Admitting: Internal Medicine

## 2013-05-13 LAB — HM MAMMOGRAPHY: HM MAMMO: NEGATIVE

## 2013-05-16 ENCOUNTER — Encounter: Payer: Self-pay | Admitting: Internal Medicine

## 2013-05-18 ENCOUNTER — Telehealth: Payer: Self-pay | Admitting: *Deleted

## 2013-05-18 MED ORDER — BUDESONIDE 180 MCG/ACT IN AEPB
1.0000 | INHALATION_SPRAY | Freq: Two times a day (BID) | RESPIRATORY_TRACT | Status: DC
Start: 2013-05-18 — End: 2013-08-18

## 2013-05-18 NOTE — Telephone Encounter (Signed)
Okay to refill? Not on med list. 

## 2013-05-18 NOTE — Telephone Encounter (Signed)
Refill Request   Pulmicort Flexhaler 180 mcg   Use one puff two times daily

## 2013-05-18 NOTE — Telephone Encounter (Signed)
Ok to refill x 4 

## 2013-06-10 ENCOUNTER — Encounter: Payer: Self-pay | Admitting: Internal Medicine

## 2013-06-17 ENCOUNTER — Other Ambulatory Visit (INDEPENDENT_AMBULATORY_CARE_PROVIDER_SITE_OTHER): Payer: Commercial Managed Care - PPO

## 2013-06-17 DIAGNOSIS — E7800 Pure hypercholesterolemia, unspecified: Secondary | ICD-10-CM

## 2013-06-17 LAB — HEPATIC FUNCTION PANEL
ALT: 18 U/L (ref 0–35)
AST: 20 U/L (ref 0–37)
Albumin: 4 g/dL (ref 3.5–5.2)
Alkaline Phosphatase: 56 U/L (ref 39–117)
BILIRUBIN DIRECT: 0 mg/dL (ref 0.0–0.3)
Total Bilirubin: 0.3 mg/dL (ref 0.3–1.2)
Total Protein: 7.6 g/dL (ref 6.0–8.3)

## 2013-06-18 ENCOUNTER — Encounter: Payer: Self-pay | Admitting: Internal Medicine

## 2013-07-04 ENCOUNTER — Other Ambulatory Visit: Payer: Self-pay | Admitting: Internal Medicine

## 2013-07-07 ENCOUNTER — Encounter: Payer: Self-pay | Admitting: Internal Medicine

## 2013-07-07 ENCOUNTER — Other Ambulatory Visit: Payer: Self-pay | Admitting: *Deleted

## 2013-07-07 MED ORDER — PRAVASTATIN SODIUM 10 MG PO TABS: ORAL_TABLET | ORAL | Status: DC

## 2013-07-22 ENCOUNTER — Other Ambulatory Visit: Payer: Self-pay | Admitting: Internal Medicine

## 2013-07-23 ENCOUNTER — Other Ambulatory Visit: Payer: Self-pay | Admitting: *Deleted

## 2013-07-23 MED ORDER — LEVOTHYROXINE SODIUM 112 MCG PO TABS: 112.0000 ug | ORAL_TABLET | Freq: Every day | ORAL | Status: DC

## 2013-08-18 ENCOUNTER — Encounter: Payer: Self-pay | Admitting: Internal Medicine

## 2013-08-18 ENCOUNTER — Ambulatory Visit (INDEPENDENT_AMBULATORY_CARE_PROVIDER_SITE_OTHER): Payer: Commercial Managed Care - PPO | Admitting: Internal Medicine

## 2013-08-18 VITALS — BP 120/80 | HR 76 | Temp 98.5°F | Ht 62.25 in | Wt 200.5 lb

## 2013-08-18 DIAGNOSIS — E039 Hypothyroidism, unspecified: Secondary | ICD-10-CM

## 2013-08-18 DIAGNOSIS — E7800 Pure hypercholesterolemia, unspecified: Secondary | ICD-10-CM

## 2013-08-18 DIAGNOSIS — Z800 Family history of malignant neoplasm of digestive organs: Secondary | ICD-10-CM

## 2013-08-18 DIAGNOSIS — K219 Gastro-esophageal reflux disease without esophagitis: Secondary | ICD-10-CM

## 2013-08-18 DIAGNOSIS — D649 Anemia, unspecified: Secondary | ICD-10-CM

## 2013-08-18 DIAGNOSIS — J45909 Unspecified asthma, uncomplicated: Secondary | ICD-10-CM

## 2013-08-18 LAB — COMPREHENSIVE METABOLIC PANEL
ALT: 16 U/L (ref 0–35)
AST: 18 U/L (ref 0–37)
Albumin: 4 g/dL (ref 3.5–5.2)
Alkaline Phosphatase: 60 U/L (ref 39–117)
BILIRUBIN TOTAL: 0.3 mg/dL (ref 0.2–1.2)
BUN: 7 mg/dL (ref 6–23)
CHLORIDE: 101 meq/L (ref 96–112)
CO2: 26 mEq/L (ref 19–32)
CREATININE: 0.8 mg/dL (ref 0.4–1.2)
Calcium: 9.1 mg/dL (ref 8.4–10.5)
GFR: 83.08 mL/min (ref >60.00)
Glucose, Bld: 93 mg/dL (ref 70–99)
Potassium: 4.2 mEq/L (ref 3.5–5.1)
Sodium: 136 mEq/L (ref 135–145)
Total Protein: 7.4 g/dL (ref 6.0–8.3)

## 2013-08-18 LAB — LIPID PANEL
CHOL/HDL RATIO: 4
Cholesterol: 177 mg/dL (ref 0–200)
HDL: 43.6 mg/dL (ref >39.00)
LDL CALC: 115 mg/dL — AB (ref 0–99)
NONHDL: 133.4
Triglycerides: 92 mg/dL (ref 0.0–149.0)
VLDL: 18.4 mg/dL (ref 0.0–40.0)

## 2013-08-18 NOTE — Progress Notes (Signed)
Pre visit review using our clinic review tool, if applicable. No additional management support is needed unless otherwise documented below in the visit note. 

## 2013-08-23 ENCOUNTER — Encounter: Payer: Self-pay | Admitting: Internal Medicine

## 2013-08-23 NOTE — Assessment & Plan Note (Signed)
On replacement.  Follow tsh.  Last tsh wnl.

## 2013-08-23 NOTE — Assessment & Plan Note (Addendum)
No reported problems with acid reflux today.   Follow.  EGD 2011 - normal.

## 2013-08-23 NOTE — Assessment & Plan Note (Signed)
Father had colon cancer.  Colonoscopy 09/14/2009 - normal.  Due f/u colonoscopy in five years after last (09/2014).

## 2013-08-23 NOTE — Assessment & Plan Note (Signed)
Breathing better on her current regimen.  Receiving allergy shots.  Follow.

## 2013-08-23 NOTE — Assessment & Plan Note (Signed)
Colonoscopy as outlined above.  EGD 09/14/09 - normal.  Follow.  Last hgb normal.  Follow.

## 2013-08-23 NOTE — Assessment & Plan Note (Addendum)
Low cholesterol diet and exercise.  Follow lipid panel and liver function tests.  Tolerating pravastatin.

## 2013-08-23 NOTE — Progress Notes (Signed)
Subjective:    Patient ID: Bethany Cook, female    DOB: [birth year], 48 y.o.   MRN: [id number]  HPI 48 year old female with past history of asthma and hypothyroidism who comes in today for a scheduled follow up.   States her breathing has been better.  Receiving allergy shots now.  Off flonase.  Using pro air and symbicort.  On prilosec.  No significant problems with acid reflux.   No nausea or vomiting.  No bowel change.  Previously with increased stress.  Better.  She is closing on her house.  Moving in with her partner.  Excited about this situation.  She has been having issues with her feet.  Seeing podiatry Joseph Art([place]).  Wore an air cast.  Has f/u tomorrow.  Overall doing ok.      Past Medical History  Diagnosis Date  . Asthma   . Hyperthyroidism     s/p ablation  . Environmental allergies   . Anemia     iron deficient  . Hypercholesterolemia     Current Outpatient Prescriptions on File Prior to Visit  Medication Sig Dispense Refill  . albuterol (PROVENTIL HFA;VENTOLIN HFA) 108 (90 BASE) MCG/ACT inhaler Inhale into the lungs every 6 (six) hours as needed for wheezing or shortness of breath.      . budesonide-formoterol (SYMBICORT) 160-4.5 MCG/ACT inhaler Inhale 2 puffs into the lungs 2 (two) times daily.      Marland Kitchen. levocetirizine (XYZAL) 5 MG tablet Take 5 mg by mouth every evening.      Marland Kitchen. levothyroxine (SYNTHROID) 112 MCG tablet Take 1 tablet (112 mcg total) by mouth daily.  90 tablet  2  . montelukast (SINGULAIR) 10 MG tablet Take 1 tablet by mouth at  bedtime  90 tablet  1  . omeprazole (PRILOSEC) 40 MG capsule Take 40 mg by mouth daily.      . pravastatin (PRAVACHOL) 10 MG tablet TAKE 1 TABLET BY MOUTH EVERY DAY  90 tablet  1   No current facility-administered medications on file prior to visit.    Review of Systems Patient denies any headache, lightheadedness or dizziness.  No significant sinus symptoms.   No chest pain, tightness or palpitations.  Breathing better on above regimen.    No vomiting.  Acid reflux as outlined.   No abdominal pain or cramping.  No bowel change, such as diarrhea, constipation, BRBPR or melana.  No urine change.  Increased stress.  Better.       Objective:   Physical Exam  Filed Vitals:   08/18/13 0859  BP: 120/80  Pulse: 76  Temp: 98.5 F (36.9 C)   Blood pressure recheck:  122/78, pulse 4072  48 year old female in no acute distress.   HEENT:  Nares- clear.  Oropharynx - without lesions. NECK:  Supple.  Nontender.  No audible bruit.  HEART:  Appears to be regular. LUNGS:  No crackles or wheezing audible.  Respirations even and unlabored.  RADIAL PULSE:  Equal bilaterally.  ABDOMEN:  Soft, nontender.  Bowel sounds present and normal.  No audible abdominal bruit.    EXTREMITIES:  No increased edema present.  DP pulses palpable and equal bilaterally.          Assessment & Plan:  PREVIOUS HEMATURIA.  Follow.   INCREASED PSYCHOSOCIAL STRESSORS.  Feels she is handling things relatively well.  Follow.  Doing better.   HEALTH MAINTENANCE.  Physical 04/20/13.  Pap negative with negative HPV - 04/01/12.  Mammogram 05/13/13 - Birads  I.  Colonoscopy 09/14/09 - diverticulosis otherwise normal.

## 2013-11-21 ENCOUNTER — Other Ambulatory Visit: Payer: Self-pay | Admitting: Internal Medicine

## 2013-11-30 ENCOUNTER — Other Ambulatory Visit: Payer: Self-pay | Admitting: Internal Medicine

## 2013-12-21 ENCOUNTER — Encounter: Payer: Self-pay | Admitting: Internal Medicine

## 2013-12-21 ENCOUNTER — Ambulatory Visit (INDEPENDENT_AMBULATORY_CARE_PROVIDER_SITE_OTHER): Payer: Commercial Managed Care - PPO | Admitting: Internal Medicine

## 2013-12-21 VITALS — BP 120/80 | HR 74 | Temp 98.7°F | Ht 62.25 in | Wt 204.2 lb

## 2013-12-21 DIAGNOSIS — K219 Gastro-esophageal reflux disease without esophagitis: Secondary | ICD-10-CM

## 2013-12-21 DIAGNOSIS — Z800 Family history of malignant neoplasm of digestive organs: Secondary | ICD-10-CM

## 2013-12-21 DIAGNOSIS — D649 Anemia, unspecified: Secondary | ICD-10-CM

## 2013-12-21 DIAGNOSIS — E7800 Pure hypercholesterolemia, unspecified: Secondary | ICD-10-CM

## 2013-12-21 DIAGNOSIS — E039 Hypothyroidism, unspecified: Secondary | ICD-10-CM

## 2013-12-21 DIAGNOSIS — J4520 Mild intermittent asthma, uncomplicated: Secondary | ICD-10-CM

## 2013-12-21 LAB — COMPREHENSIVE METABOLIC PANEL
ALT: 15 U/L (ref 0–35)
AST: 19 U/L (ref 0–37)
Albumin: 3.7 g/dL (ref 3.5–5.2)
Alkaline Phosphatase: 64 U/L (ref 39–117)
BUN: 11 mg/dL (ref 6–23)
CALCIUM: 9.1 mg/dL (ref 8.4–10.5)
CHLORIDE: 104 meq/L (ref 96–112)
CO2: 25 meq/L (ref 19–32)
Creatinine, Ser: 0.8 mg/dL (ref 0.4–1.2)
GFR: 79.43 mL/min (ref >60.00)
Glucose, Bld: 92 mg/dL (ref 70–99)
Potassium: 3.6 mEq/L (ref 3.5–5.1)
Sodium: 138 mEq/L (ref 135–145)
Total Bilirubin: 0.5 mg/dL (ref 0.2–1.2)
Total Protein: 8.1 g/dL (ref 6.0–8.3)

## 2013-12-21 LAB — LIPID PANEL
CHOL/HDL RATIO: 4
Cholesterol: 171 mg/dL (ref 0–200)
HDL: 40.2 mg/dL (ref >39.00)
LDL Cholesterol: 113 mg/dL — ABNORMAL HIGH (ref 0–99)
NONHDL: 130.8
Triglycerides: 87 mg/dL (ref 0.0–149.0)
VLDL: 17.4 mg/dL (ref 0.0–40.0)

## 2013-12-21 NOTE — Progress Notes (Signed)
Subjective:    Patient ID: Bethany Cook, female    DOB: [birth year], 48 y.o.   MRN: [id number]  HPI 48 year old female with past history of asthma and hypothyroidism who comes in today for a scheduled follow up.   States her breathing has been better.  Receiving allergy shots now.  Using pro air and symbicort.  On prilosec.  No problems with acid reflux.   No nausea or vomiting.  No bowel change.  Previously with increased stress.  Better.  Has moved in with her partner.  Sold her house.  Things are going well.  Handling stress at work relatively well.       Past Medical History  Diagnosis Date  . Asthma   . Hyperthyroidism     s/p ablation  . Environmental allergies   . Anemia     iron deficient  . Hypercholesterolemia     Current Outpatient Prescriptions on File Prior to Visit  Medication Sig Dispense Refill  . albuterol (PROVENTIL HFA;VENTOLIN HFA) 108 (90 BASE) MCG/ACT inhaler Inhale into the lungs every 6 (six) hours as needed for wheezing or shortness of breath.      . budesonide-formoterol (SYMBICORT) 160-4.5 MCG/ACT inhaler Inhale 2 puffs into the lungs 2 (two) times daily.      Marland Kitchen. levocetirizine (XYZAL) 5 MG tablet Take 5 mg by mouth every evening.      Marland Kitchen. levothyroxine (SYNTHROID) 112 MCG tablet Take 1 tablet (112 mcg total) by mouth daily.  90 tablet  2  . montelukast (SINGULAIR) 10 MG tablet Take 1 tablet by mouth at  bedtime  90 tablet  1  . omeprazole (PRILOSEC) 40 MG capsule Take 40 mg by mouth daily.      . pravastatin (PRAVACHOL) 10 MG tablet Take 1 tablet by mouth  every day  90 tablet  1   No current facility-administered medications on file prior to visit.    Review of Systems Patient denies any headache, lightheadedness or dizziness.  No significant sinus symptoms or allergy symptoms.  Receiving allergy shots.   No chest pain, tightness or palpitations.  Breathing better.  No vomiting.  Acid reflux controlled.  No abdominal pain or cramping.  No bowel change, such  as diarrhea, constipation, BRBPR or melana.  No urine change.  Increased stress.  Better.       Objective:   Physical Exam  Filed Vitals:   12/21/13 0802  BP: 120/80  Pulse: 74  Temp: 98.7 F (37.1 C)   Blood pressure recheck:  38118/6078  48 year old female in no acute distress.   HEENT:  Nares- clear.  Oropharynx - without lesions. NECK:  Supple.  Nontender.  No audible bruit.  HEART:  Appears to be regular. LUNGS:  No crackles or wheezing audible.  Respirations even and unlabored.  RADIAL PULSE:  Equal bilaterally.  ABDOMEN:  Soft, nontender.  Bowel sounds present and normal.  No audible abdominal bruit.    EXTREMITIES:  No increased edema present.  DP pulses palpable and equal bilaterally.          Assessment & Plan:  PREVIOUS HEMATURIA.  Follow.   INCREASED PSYCHOSOCIAL STRESSORS.  Feels she is handling things relatively well.  Follow.  Doing better.   Anemia, unspecified anemia type Last hgb wnl.  Follow.  Up to date with colonoscopy.    Asthma, mild intermittent, uncomplicated Breathing better.  Receiving allergy shots.  Follow.    Family history of colon cancer Colonoscopy 09/14/09.  Bowels doing well.    Gastroesophageal reflux disease, esophagitis presence not specified Reflux controlled on omeprazole.  Follow.    Hypercholesterolemia Low cholesterol diet and exercise.  Follow.  - Comprehensive metabolic panel - Lipid panel   Hypothyroidism, unspecified hypothyroidism type On thyroid replacement.  Last tsh wnl.  Follow.   HEALTH MAINTENANCE.  Physical 04/20/13.  Pap negative with negative HPV - 04/01/12.  Mammogram 05/13/13 - Birads I.  Colonoscopy 09/14/09 - diverticulosis otherwise normal.

## 2013-12-21 NOTE — Progress Notes (Signed)
Pre visit review using our clinic review tool, if applicable. No additional management support is needed unless otherwise documented below in the visit note. 

## 2013-12-22 ENCOUNTER — Encounter: Payer: Self-pay | Admitting: Internal Medicine

## 2014-03-24 ENCOUNTER — Encounter: Payer: Self-pay | Admitting: Internal Medicine

## 2014-03-29 ENCOUNTER — Encounter: Payer: Self-pay | Admitting: Internal Medicine

## 2014-03-29 NOTE — Telephone Encounter (Signed)
Patient scheduled for 2pm (1/19)

## 2014-03-30 ENCOUNTER — Ambulatory Visit (INDEPENDENT_AMBULATORY_CARE_PROVIDER_SITE_OTHER): Payer: Commercial Managed Care - PPO | Admitting: Internal Medicine

## 2014-03-30 ENCOUNTER — Encounter: Payer: Self-pay | Admitting: Internal Medicine

## 2014-03-30 VITALS — BP 116/76 | HR 77 | Temp 98.5°F | Ht 62.25 in | Wt 205.0 lb

## 2014-03-30 DIAGNOSIS — J3489 Other specified disorders of nose and nasal sinuses: Secondary | ICD-10-CM

## 2014-03-30 DIAGNOSIS — J4520 Mild intermittent asthma, uncomplicated: Secondary | ICD-10-CM

## 2014-03-30 DIAGNOSIS — K219 Gastro-esophageal reflux disease without esophagitis: Secondary | ICD-10-CM

## 2014-03-30 MED ORDER — AMOXICILLIN 875 MG PO TABS: 875.0000 mg | ORAL_TABLET | Freq: Two times a day (BID) | ORAL | Status: DC

## 2014-03-30 NOTE — Progress Notes (Signed)
Pre visit review using our clinic review tool, if applicable. No additional management support is needed unless otherwise documented below in the visit note. 

## 2014-03-30 NOTE — Patient Instructions (Signed)
Add zantac - 30 minutes before supper.  Continue omeprazole 40mg  30 minutes before breakfast.    Saline nasal spray - flush nose at lease 2-3x/day  Afrin nasal spray - 2 sprays each nostril two times per day for three days only then stop.

## 2014-04-01 ENCOUNTER — Encounter: Payer: Self-pay | Admitting: Internal Medicine

## 2014-04-01 DIAGNOSIS — J3489 Other specified disorders of nose and nasal sinuses: Secondary | ICD-10-CM | POA: Insufficient documentation

## 2014-04-01 NOTE — Progress Notes (Signed)
   Subjective:    Patient ID: Bethany Cook, female    DOB: Jun 05, 1965, 49 y.o.   MRN: [id number]  HPI 49 year old female with past history of asthma and hypothyroidism who comes in today as a work in with concerns regarding increased drainage.  States she was seen on 03/19/14 [facility].  Treated with amoxicillin and prednisone.  States symptoms improved.  With some persistent fatigue.  Reports head feels full.  Ears popping.  Persistent drainage.  No fever.  Taking tylenol.  Using saline nasal spray.  No chest congestion.  No chest tightness.   No sob or increased wheezing.  No nausea or vomiting.  Some minimal acid reflux.  Taking omeprazole.     Past Medical History  Diagnosis Date  . Asthma   . Hyperthyroidism     s/p ablation  . Environmental allergies   . Anemia     iron deficient  . Hypercholesterolemia     Current Outpatient Prescriptions on File Prior to Visit  Medication Sig Dispense Refill  . albuterol (PROVENTIL HFA;VENTOLIN HFA) 108 (90 BASE) MCG/ACT inhaler Inhale into the lungs every 6 (six) hours as needed for wheezing or shortness of breath.    . budesonide-formoterol (SYMBICORT) 160-4.5 MCG/ACT inhaler Inhale 2 puffs into the lungs 2 (two) times daily.    Marland Kitchen. levocetirizine (XYZAL) 5 MG tablet Take 5 mg by mouth every evening.    Marland Kitchen. levothyroxine (SYNTHROID) 112 MCG tablet Take 1 tablet (112 mcg total) by mouth daily. 90 tablet 2  . montelukast (SINGULAIR) 10 MG tablet Take 1 tablet by mouth at  bedtime 90 tablet 1  . omeprazole (PRILOSEC) 40 MG capsule Take 40 mg by mouth daily.    . pravastatin (PRAVACHOL) 10 MG tablet Take 1 tablet by mouth  every day 90 tablet 1   No current facility-administered medications on file prior to visit.    Review of Systems Patient denies any headache, lightheadedness or dizziness.  Some increased head fullness and congestion.  Ears popping.  increased drainage.  No chest pain, tightness or palpitations.  No increased shortness of  breath.  No wheezing.  No nausea or vomiting.  No bowel change, such as diarrhea now.  Had some minimal diarrhea while on recent abx.  Taking tylenol.          Objective:   Physical Exam Filed Vitals:   03/30/14 1401  BP: 116/76  Pulse: 77  Temp: 98.5 F (6036.129 C)   49 year old female in no acute distress.   HEENT:  Nares- slightly erythematous turbinates.   Oropharynx - without lesions.  TMs without erythema.  NECK:  Supple.  Nontender.  HEART:  Appears to be regular. LUNGS:  No crackles or wheezing audible.  Respirations even and unlabored.         Assessment & Plan:  1. Asthma, mild intermittent, uncomplicated Breathing stable.  Doing well on current regimen.  Continue to f/u with the allergist.    2. Gastroesophageal reflux disease, esophagitis presence not specified Some minimal reflux.  On omeprazole 40mg  q day.  Add zantac in the evening.  Follow.  If persistent, will ned referral back to GI.    3. Nasal drainage Recently treated with amoxicillin and prednisone.  With persistent/worsening symptoms recently.  Extend amoxicillin as directed.  Saline nasal spray and afrin nasal spray as directed.  Treat reflux.  Follow.

## 2014-04-23 ENCOUNTER — Encounter: Payer: Self-pay | Admitting: Internal Medicine

## 2014-04-23 ENCOUNTER — Ambulatory Visit (INDEPENDENT_AMBULATORY_CARE_PROVIDER_SITE_OTHER): Payer: Commercial Managed Care - PPO | Admitting: Internal Medicine

## 2014-04-23 VITALS — BP 124/90 | HR 78 | Temp 98.2°F | Ht 62.5 in | Wt 207.2 lb

## 2014-04-23 DIAGNOSIS — Z800 Family history of malignant neoplasm of digestive organs: Secondary | ICD-10-CM

## 2014-04-23 DIAGNOSIS — K219 Gastro-esophageal reflux disease without esophagitis: Secondary | ICD-10-CM

## 2014-04-23 DIAGNOSIS — D649 Anemia, unspecified: Secondary | ICD-10-CM

## 2014-04-23 DIAGNOSIS — Z0000 Encounter for general adult medical examination without abnormal findings: Secondary | ICD-10-CM

## 2014-04-23 DIAGNOSIS — Z91048 Other nonmedicinal substance allergy status: Secondary | ICD-10-CM

## 2014-04-23 DIAGNOSIS — E7800 Pure hypercholesterolemia, unspecified: Secondary | ICD-10-CM

## 2014-04-23 DIAGNOSIS — Z9109 Other allergy status, other than to drugs and biological substances: Secondary | ICD-10-CM | POA: Insufficient documentation

## 2014-04-23 DIAGNOSIS — E039 Hypothyroidism, unspecified: Secondary | ICD-10-CM

## 2014-04-23 DIAGNOSIS — J4520 Mild intermittent asthma, uncomplicated: Secondary | ICD-10-CM

## 2014-04-23 HISTORY — DX: Encounter for general adult medical examination without abnormal findings: Z00.00

## 2014-04-23 LAB — CBC WITH DIFFERENTIAL/PLATELET
BASOS ABS: 0.1 10*3/uL (ref 0.0–0.1)
Basophils Relative: 0.6 % (ref 0.0–3.0)
Eosinophils Absolute: 0.2 10*3/uL (ref 0.0–0.7)
Eosinophils Relative: 2 % (ref 0.0–5.0)
HCT: 39.5 % (ref 36.0–46.0)
HEMOGLOBIN: 13.3 g/dL (ref 12.0–15.0)
Lymphocytes Relative: 20.6 % (ref 12.0–46.0)
Lymphs Abs: 2 10*3/uL (ref 0.7–4.0)
MCHC: 33.8 g/dL (ref 30.0–36.0)
MCV: 84.3 fl (ref 78.0–100.0)
MONOS PCT: 7.7 % (ref 3.0–12.0)
Monocytes Absolute: 0.8 10*3/uL (ref 0.1–1.0)
NEUTROS ABS: 6.7 10*3/uL (ref 1.4–7.7)
Neutrophils Relative %: 69.1 % (ref 43.0–77.0)
Platelets: 360 10*3/uL (ref 150.0–400.0)
RBC: 4.68 Mil/uL (ref 3.87–5.11)
RDW: 14.1 % (ref 11.5–15.5)
WBC: 9.8 10*3/uL (ref 4.0–10.5)

## 2014-04-23 LAB — COMPREHENSIVE METABOLIC PANEL
ALT: 21 U/L (ref 0–35)
AST: 17 U/L (ref 0–37)
Albumin: 4.5 g/dL (ref 3.5–5.2)
Alkaline Phosphatase: 85 U/L (ref 39–117)
BILIRUBIN TOTAL: 0.4 mg/dL (ref 0.2–1.2)
BUN: 11 mg/dL (ref 6–23)
CO2: 29 meq/L (ref 19–32)
Calcium: 10 mg/dL (ref 8.4–10.5)
Chloride: 104 mEq/L (ref 96–112)
Creatinine, Ser: 0.81 mg/dL (ref 0.40–1.20)
GFR: 79.32 mL/min (ref >60.00)
GLUCOSE: 98 mg/dL (ref 70–99)
Potassium: 4.6 mEq/L (ref 3.5–5.1)
SODIUM: 143 meq/L (ref 135–145)
Total Protein: 7.9 g/dL (ref 6.0–8.3)

## 2014-04-23 LAB — LIPID PANEL
CHOLESTEROL: 191 mg/dL (ref 0–200)
HDL: 46.9 mg/dL (ref >39.00)
LDL CALC: 123 mg/dL — AB (ref 0–99)
NonHDL: 144.1
Total CHOL/HDL Ratio: 4
Triglycerides: 104 mg/dL (ref 0.0–149.0)
VLDL: 20.8 mg/dL (ref 0.0–40.0)

## 2014-04-23 LAB — TSH: TSH: 0.71 u[IU]/mL (ref 0.35–4.50)

## 2014-04-23 NOTE — Assessment & Plan Note (Addendum)
Physical 04/23/14.  PAP 04/01/12 - negative with negative HPV.  Mammogram 05/13/13 - birads I.  Colonoscopy 09/14/09.  She will schedule her mammogram.

## 2014-04-23 NOTE — Progress Notes (Signed)
Pre visit review using our clinic review tool, if applicable. No additional management support is needed unless otherwise documented below in the visit note. 

## 2014-04-23 NOTE — Progress Notes (Signed)
Patient ID: Bethany Cook, female   DOB: 1965/06/13, 49 y.o.   MRN: [id number]   Subjective:    Patient ID: Bethany RidgesMarsha D Cook, female    DOB: 1965/06/13, 49 y.o.   MRN: [id number]  HPI  Patient here for her physical exam.  She has been having persistent increased cough and congestion.  Is better.  Seeing the allergist.  See his note for details.  Breathing stable.  Handling stress well.  Discussed diet and exercise.  No cardiac symptoms with increased activity or exertion.  Eating and drinking well.  Bowels stable.     Past Medical History  Diagnosis Date  . Asthma   . Hyperthyroidism     s/p ablation  . Environmental allergies   . Anemia     iron deficient  . Hypercholesterolemia     Current Outpatient Prescriptions on File Prior to Visit  Medication Sig Dispense Refill  . albuterol (PROVENTIL HFA;VENTOLIN HFA) 108 (90 BASE) MCG/ACT inhaler Inhale into the lungs every 6 (six) hours as needed for wheezing or shortness of breath.    . budesonide-formoterol (SYMBICORT) 160-4.5 MCG/ACT inhaler Inhale 2 puffs into the lungs 2 (two) times daily.    Marland Kitchen. levocetirizine (XYZAL) 5 MG tablet Take 5 mg by mouth every evening.    Marland Kitchen. levothyroxine (SYNTHROID) 112 MCG tablet Take 1 tablet (112 mcg total) by mouth daily. 90 tablet 2  . montelukast (SINGULAIR) 10 MG tablet Take 1 tablet by mouth at  bedtime 90 tablet 1  . omeprazole (PRILOSEC) 40 MG capsule Take 40 mg by mouth daily.    . pravastatin (PRAVACHOL) 10 MG tablet Take 1 tablet by mouth  every day 90 tablet 1   No current facility-administered medications on file prior to visit.    Review of Systems  Constitutional: Negative for appetite change and unexpected weight change.  HENT: Negative for congestion, sinus pressure and sore throat.   Eyes: Negative for pain and visual disturbance.  Respiratory: Negative for cough, chest tightness and shortness of breath.   Cardiovascular: Negative for chest pain, palpitations and leg swelling.    Gastrointestinal: Negative for abdominal pain, diarrhea and constipation.  Genitourinary: Negative for frequency and difficulty urinating.  Musculoskeletal: Negative for back pain and joint swelling.  Skin: Negative for color change and rash.  Neurological: Negative for dizziness, light-headedness and headaches.  Hematological: Negative for adenopathy. Does not bruise/bleed easily.  Psychiatric/Behavioral: Negative for dysphoric mood and decreased concentration.       Objective:     Blood pressure recheck;  120/84  Physical Exam  Constitutional: She is oriented to person, place, and time. She appears well-developed and well-nourished.  HENT:  Nose: Nose normal.  Mouth/Throat: Oropharynx is clear and moist.  Eyes: Right eye exhibits no discharge. Left eye exhibits no discharge. No scleral icterus.  Neck: Neck supple. No thyromegaly present.  Cardiovascular: Normal rate and regular rhythm.   Pulmonary/Chest: Breath sounds normal. No accessory muscle usage. No tachypnea. No respiratory distress. She has no decreased breath sounds. She has no wheezes. She has no rhonchi. Right breast exhibits no inverted nipple, no mass, no nipple discharge and no tenderness (no axillary adenopathy). Left breast exhibits no inverted nipple, no mass, no nipple discharge and no tenderness (no axilarry adenopathy).  Abdominal: Soft. Bowel sounds are normal. There is no tenderness.  Musculoskeletal: She exhibits no edema or tenderness.  Lymphadenopathy:    She has no cervical adenopathy.  Neurological: She is alert and oriented to person, place, and  time.  Skin: Skin is warm. No rash noted.  Psychiatric: She has a normal mood and affect. Her behavior is normal.    BP 124/90 mmHg  Pulse 78  Temp(Src) 98.2 F (36.8 C) (Oral)  Ht 5' 2.5" (1.588 m)  Wt 207 lb 4 oz (94.008 kg)  BMI 37.28 kg/m2  SpO2 98% Wt Readings from Last 3 Encounters:  04/23/14 207 lb 4 oz (94.008 kg)  03/30/14 205 lb (92.987 kg)   12/21/13 204 lb 4 oz (92.647 kg)     Lab Results  Component Value Date   WBC 9.8 04/23/2014   HGB 13.3 04/23/2014   HCT 39.5 04/23/2014   PLT 360.0 04/23/2014   GLUCOSE 98 04/23/2014   CHOL 191 04/23/2014   TRIG 104.0 04/23/2014   HDL 46.90 04/23/2014   LDLDIRECT 157.7 04/21/2013   LDLCALC 123* 04/23/2014   ALT 21 04/23/2014   AST 17 04/23/2014   NA 143 04/23/2014   K 4.6 04/23/2014   CL 104 04/23/2014   CREATININE 0.81 04/23/2014   BUN 11 04/23/2014   CO2 29 04/23/2014   TSH 0.71 04/23/2014       Assessment & Plan:   Problem List Items Addressed This Visit    Anemia - Primary    Follow cbc.        Relevant Orders   CBC with Differential/Platelet (Completed)   Asthma    Breathing overall stable.  Continue current medication regimen.  Continue to f/u with Dr [place] Callas.        Environmental allergies    Seeing Dr [place] Callas.  See his note.        Family history of colon cancer    Father had colon cancer.  Colonoscopy 09/14/2009 - normal.  Due 09/2014.        GERD (gastroesophageal reflux disease)    Some acid reflux symptoms.  Intermittent.  Continue omeprazole as she is doing.  Add zantac in the evening.  Follow.  Notify me if persistent symptoms.        Health care maintenance    Physical 04/23/14.  PAP 04/01/12 - negative with negative HPV.  Mammogram 05/13/13 - birads I.  Colonoscopy 09/14/09.  She will schedule her mammogram.        Hypercholesterolemia    Low cholesterol diet and exercise.  On pravastatin.  Follow lipid panel and liver function tests.        Relevant Orders   Comprehensive metabolic panel (Completed)   Lipid panel (Completed)   Hypothyroidism    On thyroid replacement.  Follow tsh.        Relevant Orders   TSH (Completed)     I spent 25 minutes with the patient and more than 50% of the time was spent in consultation regarding the above.     Dale [place], MD

## 2014-04-25 ENCOUNTER — Encounter: Payer: Self-pay | Admitting: Internal Medicine

## 2014-04-25 NOTE — Assessment & Plan Note (Signed)
Low cholesterol diet and exercise.  On pravastatin.  Follow lipid panel and liver function tests.   

## 2014-04-25 NOTE — Assessment & Plan Note (Signed)
Some acid reflux symptoms.  Intermittent.  Continue omeprazole as she is doing.  Add zantac in the evening.  Follow.  Notify me if persistent symptoms.

## 2014-04-25 NOTE — Assessment & Plan Note (Signed)
Follow cbc.  

## 2014-04-25 NOTE — Assessment & Plan Note (Signed)
Seeing Dr [place] CallasSharma.  See his note.

## 2014-04-25 NOTE — Assessment & Plan Note (Signed)
On thyroid replacement.  Follow tsh.  

## 2014-04-25 NOTE — Assessment & Plan Note (Signed)
Breathing overall stable.  Continue current medication regimen.  Continue to f/u with Dr [place] CallasSharma.

## 2014-04-25 NOTE — Assessment & Plan Note (Signed)
Father had colon cancer.  Colonoscopy 09/14/2009 - normal.  Due 09/2014.

## 2014-05-12 ENCOUNTER — Other Ambulatory Visit: Payer: Self-pay | Admitting: Internal Medicine

## 2014-05-17 ENCOUNTER — Ambulatory Visit: Payer: Self-pay | Admitting: Internal Medicine

## 2014-05-17 ENCOUNTER — Encounter: Payer: Self-pay | Admitting: Internal Medicine

## 2014-05-17 LAB — HM MAMMOGRAPHY: HM Mammogram: NEGATIVE

## 2014-05-25 ENCOUNTER — Encounter: Payer: Self-pay | Admitting: Internal Medicine

## 2014-05-25 ENCOUNTER — Ambulatory Visit (INDEPENDENT_AMBULATORY_CARE_PROVIDER_SITE_OTHER): Payer: Commercial Managed Care - PPO | Admitting: Internal Medicine

## 2014-05-25 ENCOUNTER — Telehealth: Payer: Self-pay | Admitting: *Deleted

## 2014-05-25 VITALS — BP 110/80 | HR 88 | Temp 98.7°F | Ht 62.5 in | Wt 206.2 lb

## 2014-05-25 DIAGNOSIS — J069 Acute upper respiratory infection, unspecified: Secondary | ICD-10-CM

## 2014-05-25 MED ORDER — PREDNISONE 10 MG PO TABS: ORAL_TABLET | ORAL | Status: DC

## 2014-05-25 MED ORDER — CEFDINIR 300 MG PO CAPS: 300.0000 mg | ORAL_CAPSULE | Freq: Two times a day (BID) | ORAL | Status: DC

## 2014-05-25 NOTE — Telephone Encounter (Signed)
Pt left message stating that she was transferred to my voicemail after she was told that Dr. Lorin PicketScott did not have any openings today. Pt c/o cough & chest tightness. Wants to know if you can see her today or should she go to acute care? Please advise.

## 2014-05-25 NOTE — Progress Notes (Signed)
Patient ID: Bethany RidgesMarsha D Cisek, female   DOB: Mar 12, 1966, 49 y.o.   MRN: [id number]   Subjective:    Patient ID: Bethany Cook, female    DOB: Mar 12, 1966, 49 y.o.   MRN: [id number]  HPI  Patient here as a work in with concerns regarding increased cough and congestion.  Symptoms started a few days ago.  Increased chest congestion - productive.  Subjective fever.  Taking tylenol and mucinex.  Used her albuterol inhaler x 1 yesterday.  Has known asthma.  Taking singulair.  No vomiting.  No diarrhea.     Past Medical History  Diagnosis Date  . Asthma   . Hyperthyroidism     s/p ablation  . Environmental allergies   . Anemia     iron deficient  . Hypercholesterolemia     Current Outpatient Prescriptions on File Prior to Visit  Medication Sig Dispense Refill  . albuterol (PROVENTIL HFA;VENTOLIN HFA) 108 (90 BASE) MCG/ACT inhaler Inhale into the lungs every 6 (six) hours as needed for wheezing or shortness of breath.    . budesonide-formoterol (SYMBICORT) 160-4.5 MCG/ACT inhaler Inhale 2 puffs into the lungs 2 (two) times daily.    Marland Kitchen. levocetirizine (XYZAL) 5 MG tablet Take 5 mg by mouth every evening.    Marland Kitchen. levothyroxine (SYNTHROID, LEVOTHROID) 112 MCG tablet Take 1 tablet by mouth  daily 90 tablet 3  . montelukast (SINGULAIR) 10 MG tablet Take 1 tablet by mouth at  bedtime 90 tablet 1  . omeprazole (PRILOSEC) 40 MG capsule Take 40 mg by mouth daily.    . pravastatin (PRAVACHOL) 10 MG tablet Take 1 tablet by mouth  every day 90 tablet 1   No current facility-administered medications on file prior to visit.    Review of Systems  Constitutional: Negative for appetite change.  HENT: Positive for congestion. Negative for sinus pressure.   Respiratory: Positive for cough (productive. ) and wheezing.   Cardiovascular: Negative for chest pain and leg swelling.  Gastrointestinal: Negative for nausea, vomiting and diarrhea.  Neurological: Negative for dizziness, light-headedness and headaches.        Objective:    Physical Exam  Constitutional: She appears well-developed and well-nourished. No distress.  HENT:  Nose: Nose normal.  Mouth/Throat: Oropharynx is clear and moist.  Neck: Neck supple. No thyromegaly present.  Cardiovascular: Normal rate and regular rhythm.   Pulmonary/Chest: Breath sounds normal. No respiratory distress. She has no wheezes.  Increased cough with forced expiration.   Lymphadenopathy:    She has no cervical adenopathy.    BP 110/80 mmHg  Pulse 88  Temp(Src) 98.7 F (37.1 C) (Oral)  Ht 5' 2.5" (1.588 m)  Wt 206 lb 4 oz (93.554 kg)  BMI 37.10 kg/m2  SpO2 97% Wt Readings from Last 3 Encounters:  05/25/14 206 lb 4 oz (93.554 kg)  04/23/14 207 lb 4 oz (94.008 kg)  03/30/14 205 lb (92.987 kg)     Lab Results  Component Value Date   WBC 9.8 04/23/2014   HGB 13.3 04/23/2014   HCT 39.5 04/23/2014   PLT 360.0 04/23/2014   GLUCOSE 98 04/23/2014   CHOL 191 04/23/2014   TRIG 104.0 04/23/2014   HDL 46.90 04/23/2014   LDLDIRECT 157.7 04/21/2013   LDLCALC 123* 04/23/2014   ALT 21 04/23/2014   AST 17 04/23/2014   NA 143 04/23/2014   K 4.6 04/23/2014   CL 104 04/23/2014   CREATININE 0.81 04/23/2014   BUN 11 04/23/2014   CO2 29 04/23/2014  TSH 0.71 04/23/2014       Assessment & Plan:   Problem List Items Addressed This Visit    URI (upper respiratory infection) - Primary    Increased cough and congestion as outlined.  Treat with omnicef as directed.  Saline nasal spray and nasacort nasal spray as directed.  Continue inhalers as directed.  mucinex DM and the robitussin DM as directed.  Prednisone taper.  Follow.        Relevant Medications   cefdinir (OMNICEF) capsule 300 mg       Dale [place], MD

## 2014-05-25 NOTE — Telephone Encounter (Signed)
LMTCB

## 2014-05-25 NOTE — Progress Notes (Signed)
Pre visit review using our clinic review tool, if applicable. No additional management support is needed unless otherwise documented below in the visit note. 

## 2014-05-25 NOTE — Telephone Encounter (Signed)
Pt called back & placed on schedule today at 1:15.

## 2014-05-25 NOTE — Patient Instructions (Signed)
Saline nasal spray - flush nose at least 2-3x/day  nasacort nasal spray - 2 sprays each nostril one time per day.  Do this in evening.    mucinex in the am.    Robitussin DM in the evening

## 2014-05-25 NOTE — Telephone Encounter (Signed)
I can see her at 1:15 today.  Work in for this.

## 2014-05-30 ENCOUNTER — Encounter: Payer: Self-pay | Admitting: Internal Medicine

## 2014-05-30 DIAGNOSIS — J069 Acute upper respiratory infection, unspecified: Secondary | ICD-10-CM

## 2014-05-30 HISTORY — DX: Acute upper respiratory infection, unspecified: J06.9

## 2014-05-30 NOTE — Assessment & Plan Note (Signed)
Increased cough and congestion as outlined.  Treat with omnicef as directed.  Saline nasal spray and nasacort nasal spray as directed.  Continue inhalers as directed.  mucinex DM and the robitussin DM as directed.  Prednisone taper.  Follow.

## 2014-07-22 ENCOUNTER — Encounter: Payer: Self-pay | Admitting: Internal Medicine

## 2014-07-22 ENCOUNTER — Ambulatory Visit (INDEPENDENT_AMBULATORY_CARE_PROVIDER_SITE_OTHER): Payer: Commercial Managed Care - PPO | Admitting: Internal Medicine

## 2014-07-22 VITALS — BP 120/80 | HR 60 | Temp 98.3°F | Ht 62.5 in | Wt 204.2 lb

## 2014-07-22 DIAGNOSIS — D649 Anemia, unspecified: Secondary | ICD-10-CM

## 2014-07-22 DIAGNOSIS — K219 Gastro-esophageal reflux disease without esophagitis: Secondary | ICD-10-CM

## 2014-07-22 DIAGNOSIS — Z800 Family history of malignant neoplasm of digestive organs: Secondary | ICD-10-CM

## 2014-07-22 DIAGNOSIS — Z91048 Other nonmedicinal substance allergy status: Secondary | ICD-10-CM

## 2014-07-22 DIAGNOSIS — Z9109 Other allergy status, other than to drugs and biological substances: Secondary | ICD-10-CM

## 2014-07-22 DIAGNOSIS — E7800 Pure hypercholesterolemia, unspecified: Secondary | ICD-10-CM

## 2014-07-22 DIAGNOSIS — J4520 Mild intermittent asthma, uncomplicated: Secondary | ICD-10-CM

## 2014-07-22 DIAGNOSIS — E039 Hypothyroidism, unspecified: Secondary | ICD-10-CM | POA: Diagnosis not present

## 2014-07-22 DIAGNOSIS — Z0000 Encounter for general adult medical examination without abnormal findings: Secondary | ICD-10-CM

## 2014-07-22 NOTE — Progress Notes (Signed)
Patient ID: Bethany Cook, female   DOB: May 02, 1965, 49 y.o.   MRN: [id number]   Subjective:    Patient ID: Bethany RidgesMarsha D Cook, female    DOB: May 02, 1965, 49 y.o.   MRN: [id number]  HPI  Patient here for a scheduled follow up.  Breathing doing well.  No increased cough or congestion.  Seeing the allergist.  Trying to stay active.  No cardiac symptoms with increased activity or exertion.  Eating and drinking well.  No bowel change.     Past Medical History  Diagnosis Date  . Asthma   . Hyperthyroidism     s/p ablation  . Environmental allergies   . Anemia     iron deficient  . Hypercholesterolemia     Current Outpatient Prescriptions on File Prior to Visit  Medication Sig Dispense Refill  . albuterol (PROVENTIL HFA;VENTOLIN HFA) 108 (90 BASE) MCG/ACT inhaler Inhale into the lungs every 6 (six) hours as needed for wheezing or shortness of breath.    . budesonide-formoterol (SYMBICORT) 160-4.5 MCG/ACT inhaler Inhale 2 puffs into the lungs 2 (two) times daily.    Marland Kitchen. levocetirizine (XYZAL) 5 MG tablet Take 5 mg by mouth every evening.    Marland Kitchen. levothyroxine (SYNTHROID, LEVOTHROID) 112 MCG tablet Take 1 tablet by mouth  daily 90 tablet 3  . montelukast (SINGULAIR) 10 MG tablet Take 1 tablet by mouth at  bedtime 90 tablet 1  . omeprazole (PRILOSEC) 40 MG capsule Take 40 mg by mouth daily.    . pravastatin (PRAVACHOL) 10 MG tablet Take 1 tablet by mouth  every day 90 tablet 1   No current facility-administered medications on file prior to visit.    Review of Systems  Constitutional: Negative for appetite change and unexpected weight change.  HENT: Negative for congestion and sinus pressure.   Respiratory: Negative for cough, chest tightness and shortness of breath.   Cardiovascular: Negative for chest pain, palpitations and leg swelling.  Gastrointestinal: Negative for nausea, vomiting, abdominal pain and diarrhea.  Genitourinary: Negative for dysuria and difficulty urinating.  Skin: Negative  for color change and rash.  Neurological: Negative for dizziness, light-headedness and headaches.  Psychiatric/Behavioral: Negative for dysphoric mood and agitation.       Objective:     Blood pressure recheck:  126/80  Physical Exam  Constitutional: She appears well-developed and well-nourished. No distress.  HENT:  Nose: Nose normal.  Mouth/Throat: Oropharynx is clear and moist.  Neck: Neck supple. No thyromegaly present.  Cardiovascular: Normal rate and regular rhythm.   Pulmonary/Chest: Breath sounds normal. No respiratory distress. She has no wheezes.  Abdominal: Soft. Bowel sounds are normal. There is no tenderness.  Musculoskeletal: She exhibits no edema or tenderness.  Lymphadenopathy:    She has no cervical adenopathy.  Skin: No rash noted. No erythema.  Psychiatric: She has a normal mood and affect. Her behavior is normal.    BP 120/80 mmHg  Pulse 60  Temp(Src) 98.3 F (36.8 C) (Oral)  Ht 5' 2.5" (1.588 m)  Wt 204 lb 4 oz (92.647 kg)  BMI 36.74 kg/m2  SpO2 96% Wt Readings from Last 3 Encounters:  07/22/14 204 lb 4 oz (92.647 kg)  05/25/14 206 lb 4 oz (93.554 kg)  04/23/14 207 lb 4 oz (94.008 kg)     Lab Results  Component Value Date   WBC 9.8 04/23/2014   HGB 13.3 04/23/2014   HCT 39.5 04/23/2014   PLT 360.0 04/23/2014   GLUCOSE 98 04/23/2014   CHOL 191  04/23/2014   TRIG 104.0 04/23/2014   HDL 46.90 04/23/2014   LDLDIRECT 157.7 04/21/2013   LDLCALC 123* 04/23/2014   ALT 21 04/23/2014   AST 17 04/23/2014   NA 143 04/23/2014   K 4.6 04/23/2014   CL 104 04/23/2014   CREATININE 0.81 04/23/2014   BUN 11 04/23/2014   CO2 29 04/23/2014   TSH 0.71 04/23/2014       Assessment & Plan:   Problem List Items Addressed This Visit    None       Dale DurhamSCOTT, Jeriah Corkum, MD

## 2014-07-22 NOTE — Progress Notes (Signed)
Pre visit review using our clinic review tool, if applicable. No additional management support is needed unless otherwise documented below in the visit note. 

## 2014-07-25 ENCOUNTER — Encounter: Payer: Self-pay | Admitting: Internal Medicine

## 2014-07-25 NOTE — Assessment & Plan Note (Signed)
Follow cbc.  

## 2014-07-25 NOTE — Assessment & Plan Note (Signed)
Physical 04/23/14.  PAP 04/01/12 negative with negative HPV.  Mammogram 05/17/14 - Birads I.  Colonoscopy 09/14/09.  Due f/u colonoscopy in 09/2014.

## 2014-07-25 NOTE — Assessment & Plan Note (Signed)
Low cholesterol diet and exercise.  On pravastatin.  Follow lipid panel and liver function tests.   

## 2014-07-25 NOTE — Assessment & Plan Note (Signed)
Seeing Dr Mineralwells CallasSharma.  Stable.

## 2014-07-25 NOTE — Assessment & Plan Note (Signed)
Breathing stable.  Doing well.  No increased cough or congestion.

## 2014-07-25 NOTE — Assessment & Plan Note (Signed)
On thyroid replacement.  Follow tsh.  

## 2014-07-25 NOTE — Assessment & Plan Note (Signed)
Last colonoscopy 09/14/09 - normal.  Due 09/2014.

## 2014-07-25 NOTE — Assessment & Plan Note (Signed)
Reflux controlled.  On omeprazole.

## 2014-10-26 ENCOUNTER — Other Ambulatory Visit: Payer: Self-pay | Admitting: Internal Medicine

## 2014-11-18 LAB — HM COLONOSCOPY

## 2014-12-22 ENCOUNTER — Ambulatory Visit (INDEPENDENT_AMBULATORY_CARE_PROVIDER_SITE_OTHER): Payer: Commercial Managed Care - PPO | Admitting: Internal Medicine

## 2014-12-22 ENCOUNTER — Encounter: Payer: Self-pay | Admitting: Internal Medicine

## 2014-12-22 ENCOUNTER — Telehealth: Payer: Self-pay | Admitting: *Deleted

## 2014-12-22 VITALS — BP 110/70 | HR 72 | Temp 98.7°F | Resp 18 | Ht 62.5 in | Wt 203.2 lb

## 2014-12-22 DIAGNOSIS — E7800 Pure hypercholesterolemia, unspecified: Secondary | ICD-10-CM | POA: Diagnosis not present

## 2014-12-22 DIAGNOSIS — K219 Gastro-esophageal reflux disease without esophagitis: Secondary | ICD-10-CM | POA: Diagnosis not present

## 2014-12-22 DIAGNOSIS — J4520 Mild intermittent asthma, uncomplicated: Secondary | ICD-10-CM

## 2014-12-22 DIAGNOSIS — E039 Hypothyroidism, unspecified: Secondary | ICD-10-CM | POA: Diagnosis not present

## 2014-12-22 DIAGNOSIS — R229 Localized swelling, mass and lump, unspecified: Secondary | ICD-10-CM

## 2014-12-22 DIAGNOSIS — Z800 Family history of malignant neoplasm of digestive organs: Secondary | ICD-10-CM

## 2014-12-22 DIAGNOSIS — D649 Anemia, unspecified: Secondary | ICD-10-CM

## 2014-12-22 DIAGNOSIS — R42 Dizziness and giddiness: Secondary | ICD-10-CM

## 2014-12-22 NOTE — Progress Notes (Signed)
Pre-visit discussion using our clinic review tool. No additional management support is needed unless otherwise documented below in the visit note.  

## 2014-12-22 NOTE — Telephone Encounter (Signed)
Blood hemolyzed, lab needs a recollect patient is coming back this Friday Oct. 14th at 10am

## 2014-12-22 NOTE — Progress Notes (Signed)
Patient ID: Cherylynn RidgesMarsha D Mudry, female   DOB: [birth year], 49 y.o.   MRN: [id number]   Subjective:    Patient ID: Cherylynn RidgesMarsha D Szymanowski, female    DOB: [birth year], 49 y.o.   MRN: [id number]  HPI  Patient with past history of GERD, hypothyroidism and hypercholesterolemia who comes in today to follow up on these issues.  Had some right lower quadrant pain previously.  Saw Dr Rana SnareLowe.  Had pelvic ultrasound.  Negative. Referred to Dr Troy SineMedhoff.  Had colonoscopy.  Ok.  Pain is better.  Not a significant issue for her now.  Eating and drinking well.  No acid reflux.  No nausea or vomiting.  Bowels stable.  Reports some soft tissue swelling beneath the right eye.  Non tender.  Just started yesterday.  No lip or tongue swelling.  No sob.  Also reports some dizziness yesterday.  Not present today.  No headache.  Stays active.  No cardiac symptoms with increased activity or exertion.     Past Medical History  Diagnosis Date  . Asthma   . Hyperthyroidism     s/p ablation  . Environmental allergies   . Anemia     iron deficient  . Hypercholesterolemia    Past Surgical History  Procedure Laterality Date  . No past surgeries     Family History  Problem Relation Age of Onset  . Colon cancer Father   . Colon cancer Paternal Uncle     x4  . Asthma      cousine  . Breast cancer Neg Hx    Social History   Social History  . Marital Status: Single    Spouse Name: N/A  . Number of Children: 0  . Years of Education: N/A   Social History Main Topics  . Smoking status: Never Smoker   . Smokeless tobacco: Never Used  . Alcohol Use: 0.0 oz/week    0 Standard drinks or equivalent per week     Comment: occasional  . Drug Use: No  . Sexual Activity: Not Asked   Other Topics Concern  . None   Social History Narrative    Outpatient Encounter Prescriptions as of 12/22/2014  Medication Sig  . albuterol (PROVENTIL HFA;VENTOLIN HFA) 108 (90 BASE) MCG/ACT inhaler Inhale into the lungs every 6 (six) hours as  needed for wheezing or shortness of breath.  . budesonide-formoterol (SYMBICORT) 160-4.5 MCG/ACT inhaler Inhale 2 puffs into the lungs 2 (two) times daily.  Marland Kitchen. escitalopram (LEXAPRO) 10 MG tablet Take 10 mg by mouth daily.  Marland Kitchen. levocetirizine (XYZAL) 5 MG tablet Take 5 mg by mouth every evening.  Marland Kitchen. levothyroxine (SYNTHROID, LEVOTHROID) 112 MCG tablet Take 1 tablet by mouth  daily  . montelukast (SINGULAIR) 10 MG tablet Take 1 tablet by mouth at  bedtime  . omeprazole (PRILOSEC) 40 MG capsule Take 40 mg by mouth daily.  . pravastatin (PRAVACHOL) 10 MG tablet Take 1 tablet by mouth  every day   No facility-administered encounter medications on file as of 12/22/2014.    Review of Systems  Constitutional: Negative for appetite change and unexpected weight change.  HENT: Negative for congestion and sinus pressure.   Eyes: Negative for discharge and visual disturbance.  Respiratory: Negative for cough, chest tightness and shortness of breath.   Cardiovascular: Negative for chest pain, palpitations and leg swelling.  Gastrointestinal: Negative for nausea, vomiting, diarrhea and abdominal distention.  Genitourinary: Negative for dysuria and difficulty urinating.  Musculoskeletal: Negative for back pain and joint swelling.  Skin: Negative for color change and rash.  Neurological: Positive for dizziness (resolved. ). Negative for light-headedness and headaches.  Psychiatric/Behavioral: Negative for dysphoric mood and agitation.       Objective:    Physical Exam  Constitutional: She appears well-developed and well-nourished. No distress.  HENT:  Nose: Nose normal.  Mouth/Throat: Oropharynx is clear and moist.  Eyes: Conjunctivae are normal. Right eye exhibits no discharge. Left eye exhibits no discharge.  Neck: Neck supple. No thyromegaly present.  Cardiovascular: Normal rate and regular rhythm.   Pulmonary/Chest: Breath sounds normal. No respiratory distress. She has no wheezes.  Abdominal:  Soft. Bowel sounds are normal. There is no tenderness.  Musculoskeletal: She exhibits no edema or tenderness.  Lymphadenopathy:    She has no cervical adenopathy.  Skin: No rash noted. No erythema.  Psychiatric: She has a normal mood and affect. Her behavior is normal.    BP 110/70 mmHg  Pulse 72  Temp(Src) 98.7 F (37.1 C) (Oral)  Resp 18  Ht 5' 2.5" (1.588 m)  Wt 203 lb 4 oz (92.194 kg)  BMI 36.56 kg/m2  SpO2 98% Wt Readings from Last 3 Encounters:  12/22/14 203 lb 4 oz (92.194 kg)  07/22/14 204 lb 4 oz (92.647 kg)  05/25/14 206 lb 4 oz (93.554 kg)     Lab Results  Component Value Date   WBC 9.8 04/23/2014   HGB 13.3 04/23/2014   HCT 39.5 04/23/2014   PLT 360.0 04/23/2014   GLUCOSE 98 04/23/2014   CHOL 191 04/23/2014   TRIG 104.0 04/23/2014   HDL 46.90 04/23/2014   LDLDIRECT 157.7 04/21/2013   LDLCALC 123* 04/23/2014   ALT 21 04/23/2014   AST 17 04/23/2014   NA 143 04/23/2014   K 4.6 04/23/2014   CL 104 04/23/2014   CREATININE 0.81 04/23/2014   BUN 11 04/23/2014   CO2 29 04/23/2014   TSH 0.71 04/23/2014       Assessment & Plan:   Problem List Items Addressed This Visit    Anemia    Follow cbc      Asthma    Breathing stable.  Doing well.  No increased cough or congestion.  Follow.       Dizziness    Resolved.  Follow.       Family history of colon cancer    Just had f/u colonoscopy 11/18/14 - diverticulosis.  Recommended f/u colonoscopy in five years.        GERD (gastroesophageal reflux disease)    Reflux controlled.       Hypercholesterolemia - Primary    On pravastatin.  Low cholesterol diet and exercise.  Follow lipid panel.   Lab Results  Component Value Date   CHOL 191 04/23/2014   HDL 46.90 04/23/2014   LDLCALC 123* 04/23/2014   LDLDIRECT 157.7 04/21/2013   TRIG 104.0 04/23/2014   CHOLHDL 4 04/23/2014        Relevant Orders   Lipid panel   Comprehensive metabolic panel   Hypothyroidism    On thyroid replacement.  Follow  tsh.        Relevant Orders   TSH   Soft tissue swelling    Minimal swelling beneath her right eye.  Non tender.  No other swelling.  Continue antihistamine.  Follow.            Dale [place], MD

## 2014-12-23 ENCOUNTER — Encounter: Payer: Self-pay | Admitting: Internal Medicine

## 2014-12-24 ENCOUNTER — Other Ambulatory Visit: Payer: Commercial Managed Care - PPO

## 2014-12-26 ENCOUNTER — Encounter: Payer: Self-pay | Admitting: Internal Medicine

## 2014-12-26 DIAGNOSIS — R42 Dizziness and giddiness: Secondary | ICD-10-CM | POA: Insufficient documentation

## 2014-12-26 DIAGNOSIS — R229 Localized swelling, mass and lump, unspecified: Secondary | ICD-10-CM | POA: Insufficient documentation

## 2014-12-26 HISTORY — DX: Dizziness and giddiness: R42

## 2014-12-26 NOTE — Assessment & Plan Note (Signed)
On pravastatin.  Low cholesterol diet and exercise.  Follow lipid panel.   Lab Results  Component Value Date   CHOL 191 04/23/2014   HDL 46.90 04/23/2014   LDLCALC 123* 04/23/2014   LDLDIRECT 157.7 04/21/2013   TRIG 104.0 04/23/2014   CHOLHDL 4 04/23/2014

## 2014-12-26 NOTE — Assessment & Plan Note (Signed)
Breathing stable.  Doing well.  No increased cough or congestion.  Follow.

## 2014-12-26 NOTE — Assessment & Plan Note (Signed)
Resolved. Follow °

## 2014-12-26 NOTE — Assessment & Plan Note (Signed)
Minimal swelling beneath her right eye.  Non tender.  No other swelling.  Continue antihistamine.  Follow.

## 2014-12-26 NOTE — Assessment & Plan Note (Signed)
Just had f/u colonoscopy 11/18/14 - diverticulosis.  Recommended f/u colonoscopy in five years.

## 2014-12-26 NOTE — Assessment & Plan Note (Signed)
On thyroid replacement.  Follow tsh.  

## 2014-12-26 NOTE — Assessment & Plan Note (Signed)
Follow cbc.  

## 2014-12-26 NOTE — Assessment & Plan Note (Signed)
Reflux controlled

## 2014-12-28 ENCOUNTER — Other Ambulatory Visit (INDEPENDENT_AMBULATORY_CARE_PROVIDER_SITE_OTHER): Payer: Commercial Managed Care - PPO

## 2014-12-28 DIAGNOSIS — E7800 Pure hypercholesterolemia, unspecified: Secondary | ICD-10-CM

## 2014-12-28 DIAGNOSIS — E039 Hypothyroidism, unspecified: Secondary | ICD-10-CM | POA: Diagnosis not present

## 2014-12-28 DIAGNOSIS — Z0000 Encounter for general adult medical examination without abnormal findings: Secondary | ICD-10-CM

## 2014-12-28 LAB — COMPREHENSIVE METABOLIC PANEL
ALK PHOS: 79 U/L (ref 39–117)
ALT: 12 U/L (ref 0–35)
AST: 13 U/L (ref 0–37)
Albumin: 4.5 g/dL (ref 3.5–5.2)
BILIRUBIN TOTAL: 0.5 mg/dL (ref 0.2–1.2)
BUN: 10 mg/dL (ref 6–23)
CO2: 26 meq/L (ref 19–32)
Calcium: 9.6 mg/dL (ref 8.4–10.5)
Chloride: 100 mEq/L (ref 96–112)
Creatinine, Ser: 0.75 mg/dL (ref 0.40–1.20)
GFR: 86.45 mL/min (ref >60.00)
GLUCOSE: 76 mg/dL (ref 70–99)
POTASSIUM: 4.1 meq/L (ref 3.5–5.1)
SODIUM: 136 meq/L (ref 135–145)
TOTAL PROTEIN: 7.6 g/dL (ref 6.0–8.3)

## 2014-12-28 LAB — LIPID PANEL
CHOL/HDL RATIO: 4
Cholesterol: 186 mg/dL (ref 0–200)
HDL: 47.1 mg/dL (ref >39.00)
LDL Cholesterol: 122 mg/dL — ABNORMAL HIGH (ref 0–99)
NONHDL: 139.26
Triglycerides: 85 mg/dL (ref 0.0–149.0)
VLDL: 17 mg/dL (ref 0.0–40.0)

## 2014-12-28 LAB — TSH: TSH: 0.43 u[IU]/mL (ref 0.35–4.50)

## 2014-12-29 ENCOUNTER — Encounter: Payer: Self-pay | Admitting: Internal Medicine

## 2014-12-31 NOTE — Telephone Encounter (Signed)
Unread mychart message mailed to patient 

## 2015-03-28 ENCOUNTER — Ambulatory Visit
Admission: RE | Admit: 2015-03-28 | Discharge: 2015-03-28 | Disposition: A | Discharge status: Home / Self Care | Payer: Commercial Managed Care - PPO | Source: Ambulatory Visit | Attending: Allergy | Admitting: Allergy

## 2015-03-28 ENCOUNTER — Other Ambulatory Visit: Payer: Self-pay | Admitting: Allergy

## 2015-03-28 DIAGNOSIS — J4530 Mild persistent asthma, uncomplicated: Secondary | ICD-10-CM

## 2015-04-05 ENCOUNTER — Other Ambulatory Visit: Payer: Self-pay

## 2015-04-05 ENCOUNTER — Other Ambulatory Visit: Payer: Self-pay | Admitting: Internal Medicine

## 2015-04-05 MED ORDER — LEVOTHYROXINE SODIUM 112 MCG PO TABS: ORAL_TABLET | ORAL | Status: DC

## 2015-04-29 ENCOUNTER — Encounter: Payer: Self-pay | Admitting: Internal Medicine

## 2015-06-21 ENCOUNTER — Encounter: Payer: Self-pay | Admitting: Internal Medicine

## 2015-06-22 NOTE — Telephone Encounter (Signed)
Please call pt and confirm actual symptoms she is having now.  Is she feeling better since being seen?  [facility]  I can see her 06/23/15 at 4:30 if back.  If not, will need to work in next week after returns.  We are not open on Friday 06/24/15.   I can see her 4:30 on Thursday 06/30/15.  Let me know if that is a problem.

## 2015-07-07 ENCOUNTER — Encounter: Payer: Self-pay | Admitting: Internal Medicine

## 2015-07-07 ENCOUNTER — Ambulatory Visit (INDEPENDENT_AMBULATORY_CARE_PROVIDER_SITE_OTHER): Payer: Commercial Managed Care - PPO | Admitting: Internal Medicine

## 2015-07-07 VITALS — BP 120/80 | HR 72 | Temp 98.4°F | Resp 18 | Ht 62.5 in | Wt 198.2 lb

## 2015-07-07 DIAGNOSIS — R42 Dizziness and giddiness: Secondary | ICD-10-CM

## 2015-07-07 DIAGNOSIS — D649 Anemia, unspecified: Secondary | ICD-10-CM

## 2015-07-07 LAB — LIPID PANEL
Cholesterol: 208 mg/dL — ABNORMAL HIGH (ref 0–200)
HDL: 53 mg/dL (ref >39.00)
LDL Cholesterol: 135 mg/dL — ABNORMAL HIGH (ref 0–99)
NONHDL: 154.66
Total CHOL/HDL Ratio: 4
Triglycerides: 97 mg/dL (ref 0.0–149.0)
VLDL: 19.4 mg/dL (ref 0.0–40.0)

## 2015-07-07 LAB — BASIC METABOLIC PANEL
BUN: 12 mg/dL (ref 6–23)
CALCIUM: 9.5 mg/dL (ref 8.4–10.5)
CO2: 29 meq/L (ref 19–32)
Chloride: 101 mEq/L (ref 96–112)
Creatinine, Ser: 0.71 mg/dL (ref 0.40–1.20)
GFR: 91.91 mL/min (ref >60.00)
Glucose, Bld: 98 mg/dL (ref 70–99)
Potassium: 3.8 mEq/L (ref 3.5–5.1)
SODIUM: 138 meq/L (ref 135–145)

## 2015-07-07 LAB — HEPATIC FUNCTION PANEL
ALBUMIN: 4.3 g/dL (ref 3.5–5.2)
ALK PHOS: 70 U/L (ref 39–117)
ALT: 18 U/L (ref 0–35)
AST: 16 U/L (ref 0–37)
BILIRUBIN DIRECT: 0.1 mg/dL (ref 0.0–0.3)
TOTAL PROTEIN: 7.6 g/dL (ref 6.0–8.3)
Total Bilirubin: 0.4 mg/dL (ref 0.2–1.2)

## 2015-07-07 LAB — CBC WITH DIFFERENTIAL/PLATELET
BASOS PCT: 0.5 % (ref 0.0–3.0)
Basophils Absolute: 0 10*3/uL (ref 0.0–0.1)
EOS ABS: 0.2 10*3/uL (ref 0.0–0.7)
Eosinophils Relative: 1.8 % (ref 0.0–5.0)
HEMATOCRIT: 38.1 % (ref 36.0–46.0)
Hemoglobin: 12.8 g/dL (ref 12.0–15.0)
LYMPHS ABS: 1.9 10*3/uL (ref 0.7–4.0)
LYMPHS PCT: 19.9 % (ref 12.0–46.0)
MCHC: 33.6 g/dL (ref 30.0–36.0)
MCV: 85.1 fl (ref 78.0–100.0)
MONO ABS: 0.6 10*3/uL (ref 0.1–1.0)
Monocytes Relative: 6.4 % (ref 3.0–12.0)
NEUTROS ABS: 6.7 10*3/uL (ref 1.4–7.7)
Neutrophils Relative %: 71.4 % (ref 43.0–77.0)
PLATELETS: 338 10*3/uL (ref 150.0–400.0)
RBC: 4.48 Mil/uL (ref 3.87–5.11)
RDW: 14.1 % (ref 11.5–15.5)
WBC: 9.4 10*3/uL (ref 4.0–10.5)

## 2015-07-07 LAB — FERRITIN: FERRITIN: 55.3 ng/mL (ref 10.0–291.0)

## 2015-07-07 LAB — TSH: TSH: 0.51 u[IU]/mL (ref 0.35–4.50)

## 2015-07-07 NOTE — Progress Notes (Signed)
Pre-visit discussion using our clinic review tool. No additional management support is needed unless otherwise documented below in the visit note.  

## 2015-07-07 NOTE — Progress Notes (Signed)
Patient ID: Bethany Cook [place], female   DOB: June 24, 1965, 50 y.o.   MRN: [id number]   Subjective:    Patient ID: Bethany Cook [place], female    DOB: June 24, 1965, 50 y.o.   MRN: [id number]  HPI  Patient here as a work in with concerns regarding some previous dizziness.  She states back in 04/2015 she noticed being dizzy.  Described as the room spinning.  Had some vomiting associated.  No headache.  She felt this was related to her diet change.  She had adjusted her diet and decreased her carb intake.  She had another episode where she noticed dizziness again.  Was associated with vomiting.  Described as room spinning.  Went to urgent care and was given meclizine.  The next day felt fuzzy.  The following day felt much better.  States she is feeling much better.  Has noticed when gets up to fast, a little sensation change.  No headache.  No sinus issues.  Breathing is doing well.  Eating and drinking well.    Past Medical History  Diagnosis Date  . Asthma   . Hyperthyroidism     s/p ablation  . Environmental allergies   . Anemia     iron deficient  . Hypercholesterolemia    Past Surgical History  Procedure Laterality Date  . No past surgeries     Family History  Problem Relation Age of Onset  . Colon cancer Father   . Colon cancer Paternal Uncle     x4  . Asthma      cousine  . Breast cancer Neg Hx    Social History   Social History  . Marital Status: Single    Spouse Name: N/A  . Number of Children: 0  . Years of Education: N/A   Social History Main Topics  . Smoking status: Never Smoker   . Smokeless tobacco: Never Used  . Alcohol Use: 0.0 oz/week    0 Standard drinks or equivalent per week     Comment: occasional  . Drug Use: No  . Sexual Activity: Not Asked   Other Topics Concern  . None   Social History Narrative    Outpatient Encounter Prescriptions as of 07/07/2015  Medication Sig  . albuterol (PROVENTIL HFA;VENTOLIN HFA) 108 (90 BASE) MCG/ACT inhaler Inhale into the  lungs every 6 (six) hours as needed for wheezing or shortness of breath.  . budesonide-formoterol (SYMBICORT) 160-4.5 MCG/ACT inhaler Inhale 2 puffs into the lungs 2 (two) times daily.  Marland Kitchen. escitalopram (LEXAPRO) 10 MG tablet Take 10 mg by mouth daily.  Marland Kitchen. levocetirizine (XYZAL) 5 MG tablet Take 5 mg by mouth every evening.  Marland Kitchen. levothyroxine (SYNTHROID, LEVOTHROID) 112 MCG tablet Take 1 tablet by mouth  daily  . montelukast (SINGULAIR) 10 MG tablet Take 1 tablet by mouth at  bedtime  . omeprazole (PRILOSEC) 40 MG capsule Take 40 mg by mouth daily.  . pravastatin (PRAVACHOL) 10 MG tablet Take 1 tablet by mouth  every day  . meclizine (ANTIVERT) 25 MG tablet Take 25 mg by mouth 2 (two) times daily as needed for dizziness. Reported on 07/07/2015   No facility-administered encounter medications on file as of 07/07/2015.    Review of Systems  Constitutional: Negative for appetite change and unexpected weight change.  HENT: Negative for congestion and sinus pressure.   Respiratory: Negative for cough, chest tightness and shortness of breath.   Cardiovascular: Negative for chest pain, palpitations and leg swelling.  Gastrointestinal: Positive for vomiting (  associated with dizziness. ). Negative for nausea, abdominal pain and diarrhea.  Genitourinary: Negative for dysuria and difficulty urinating.  Musculoskeletal: Negative for back pain and joint swelling.  Skin: Negative for color change and rash.  Neurological: Positive for dizziness and light-headedness. Negative for headaches.  Psychiatric/Behavioral: Negative for dysphoric mood and agitation.       Objective:    Physical Exam  Constitutional: She appears well-developed and well-nourished. No distress.  HENT:  Nose: Nose normal.  Mouth/Throat: Oropharynx is clear and moist.  TMs without erythema.  Reproducible symptoms with maneuvers on the table - especially with looking to the right.   Neck: Neck supple. No thyromegaly present.    Cardiovascular: Normal rate and regular rhythm.   Pulmonary/Chest: Breath sounds normal. No respiratory distress. She has no wheezes.  Abdominal: Soft. Bowel sounds are normal. There is no tenderness.  Musculoskeletal: She exhibits no edema or tenderness.  Lymphadenopathy:    She has no cervical adenopathy.  Skin: No rash noted. No erythema.  Psychiatric: She has a normal mood and affect. Her behavior is normal.    BP 120/80 mmHg  Pulse 72  Temp(Src) 98.4 F (36.9 C) (Oral)  Resp 18  Ht 5' 2.5" (1.588 m)  Wt 198 lb 4 oz (89.926 kg)  BMI 35.66 kg/m2  SpO2 98% Wt Readings from Last 3 Encounters:  07/07/15 198 lb 4 oz (89.926 kg)  12/22/14 203 lb 4 oz (92.194 kg)  07/22/14 204 lb 4 oz (92.647 kg)     Lab Results  Component Value Date   WBC 9.4 07/07/2015   HGB 12.8 07/07/2015   HCT 38.1 07/07/2015   PLT 338.0 07/07/2015   GLUCOSE 98 07/07/2015   CHOL 208* 07/07/2015   TRIG 97.0 07/07/2015   HDL 53.00 07/07/2015   LDLDIRECT 157.7 04/21/2013   LDLCALC 135* 07/07/2015   ALT 18 07/07/2015   AST 16 07/07/2015   NA 138 07/07/2015   K 3.8 07/07/2015   CL 101 07/07/2015   CREATININE 0.71 07/07/2015   BUN 12 07/07/2015   CO2 29 07/07/2015   TSH 0.51 07/07/2015    Dg Chest 2 View  03/28/2015  CLINICAL DATA:  Chest tightness.  Persistent asthma. EXAM: CHEST  2 VIEW COMPARISON:  None. FINDINGS: The heart size and mediastinal contours are within normal limits. Both lungs are clear. The visualized skeletal structures are unremarkable. IMPRESSION: No active cardiopulmonary disease. Electronically Signed   By: Charlett Nose M.Cook.   On: 03/28/2015 15:38       Assessment & Plan:   Problem List Items Addressed This Visit    Anemia    Check cbc and ferritin today.       Dizziness - Primary    Symptoms and exam as outlined.  Appear to be c/w benign positional vertigo.  Reproducible on exam.  Refer to ENT for evaluation to see if can confirm diagnosis.  Is better.  Check routine  labs.  Follow.        Relevant Orders   CBC with Differential/Platelet (Completed)   TSH (Completed)   Ferritin (Completed)   Lipid panel (Completed)   Basic metabolic panel (Completed)   Hepatic function panel (Completed)       Dale Lem[place], MD

## 2015-07-10 ENCOUNTER — Encounter: Payer: Self-pay | Admitting: Internal Medicine

## 2015-07-10 NOTE — Assessment & Plan Note (Signed)
Symptoms and exam as outlined.  Appear to be c/w benign positional vertigo.  Reproducible on exam.  Refer to ENT for evaluation to see if can confirm diagnosis.  Is better.  Check routine labs.  Follow.

## 2015-07-10 NOTE — Assessment & Plan Note (Signed)
Check cbc and ferritin today.

## 2015-08-04 ENCOUNTER — Ambulatory Visit (INDEPENDENT_AMBULATORY_CARE_PROVIDER_SITE_OTHER): Payer: Commercial Managed Care - PPO | Admitting: Internal Medicine

## 2015-08-04 ENCOUNTER — Encounter: Payer: Self-pay | Admitting: Internal Medicine

## 2015-08-04 VITALS — BP 112/62 | HR 72 | Temp 98.3°F | Ht 62.5 in | Wt 200.5 lb

## 2015-08-04 DIAGNOSIS — Z9109 Other allergy status, other than to drugs and biological substances: Secondary | ICD-10-CM

## 2015-08-04 DIAGNOSIS — E039 Hypothyroidism, unspecified: Secondary | ICD-10-CM | POA: Diagnosis not present

## 2015-08-04 DIAGNOSIS — E7800 Pure hypercholesterolemia, unspecified: Secondary | ICD-10-CM

## 2015-08-04 DIAGNOSIS — Z800 Family history of malignant neoplasm of digestive organs: Secondary | ICD-10-CM | POA: Diagnosis not present

## 2015-08-04 DIAGNOSIS — K219 Gastro-esophageal reflux disease without esophagitis: Secondary | ICD-10-CM | POA: Diagnosis not present

## 2015-08-04 DIAGNOSIS — Z91048 Other nonmedicinal substance allergy status: Secondary | ICD-10-CM

## 2015-08-04 DIAGNOSIS — J4520 Mild intermittent asthma, uncomplicated: Secondary | ICD-10-CM

## 2015-08-04 NOTE — Progress Notes (Signed)
Patient ID: Bethany Cook, female   DOB: 10-17-1965, 50 y.o.   MRN: [id number]   Subjective:    Patient ID: Bethany Cook, female    DOB: Jan 24, 1966, 50 y.o.   MRN: [id number]  HPI  Patient here for a scheduled follow up.  She is doing well.  Feels good.  Has had no significant dizziness or light headedness.  States has noticed a couple of times when she rolled over - noticed spinning.  Nothing significant.  Did not see ENT.  Discussed referral today.  She wants to hold on referral at this time.  Feels is doing well.  No chest pain.  Tries to stay active.  No cardiac symptoms with increased activity or exertion.  No sob.  No acid reflux.  No abdominal pain or cramping.  Bowels stable.  Handling stress.     Past Medical History  Diagnosis Date  . Asthma   . Hyperthyroidism     s/p ablation  . Environmental allergies   . Anemia     iron deficient  . Hypercholesterolemia    Past Surgical History  Procedure Laterality Date  . No past surgeries     Family History  Problem Relation Age of Onset  . Colon cancer Father   . Colon cancer Paternal Uncle     x4  . Asthma      cousine  . Breast cancer Neg Hx    Social History   Social History  . Marital Status: Single    Spouse Name: N/A  . Number of Children: 0  . Years of Education: N/A   Social History Main Topics  . Smoking status: Never Smoker   . Smokeless tobacco: Never Used  . Alcohol Use: 0.0 oz/week    0 Standard drinks or equivalent per week     Comment: occasional  . Drug Use: No  . Sexual Activity: Not Asked   Other Topics Concern  . None   Social History Narrative    Outpatient Encounter Prescriptions as of 08/04/2015  Medication Sig  . albuterol (PROVENTIL HFA;VENTOLIN HFA) 108 (90 BASE) MCG/ACT inhaler Inhale into the lungs every 6 (six) hours as needed for wheezing or shortness of breath.  . budesonide-formoterol (SYMBICORT) 160-4.5 MCG/ACT inhaler Inhale 2 puffs into the lungs 2 (two) times daily.  Marland Kitchen  escitalopram (LEXAPRO) 10 MG tablet Take 10 mg by mouth daily.  Marland Kitchen levocetirizine (XYZAL) 5 MG tablet Take 5 mg by mouth every evening.  Marland Kitchen levothyroxine (SYNTHROID, LEVOTHROID) 112 MCG tablet Take 1 tablet by mouth  daily  . meclizine (ANTIVERT) 25 MG tablet Take 25 mg by mouth 2 (two) times daily as needed for dizziness. Reported on 07/07/2015  . montelukast (SINGULAIR) 10 MG tablet Take 1 tablet by mouth at  bedtime  . omeprazole (PRILOSEC) 40 MG capsule Take 40 mg by mouth daily.  . pravastatin (PRAVACHOL) 10 MG tablet Take 1 tablet by mouth  every day   No facility-administered encounter medications on file as of 08/04/2015.    Review of Systems  Constitutional: Negative for appetite change and unexpected weight change.  HENT: Negative for congestion and sinus pressure.   Respiratory: Negative for cough, chest tightness and shortness of breath.   Cardiovascular: Negative for chest pain, palpitations and leg swelling.  Gastrointestinal: Negative for nausea, vomiting, abdominal pain and diarrhea.  Genitourinary: Negative for dysuria and difficulty urinating.  Musculoskeletal: Negative for back pain and joint swelling.  Skin: Negative for color change and rash.  Neurological: Negative for dizziness, light-headedness and headaches.  Psychiatric/Behavioral: Negative for dysphoric mood and agitation.       Objective:     Blood pressure rechecked by me:  116/74  Physical Exam  Constitutional: She appears well-developed and well-nourished. No distress.  HENT:  Nose: Nose normal.  Mouth/Throat: Oropharynx is clear and moist.  Neck: Neck supple. No thyromegaly present.  Cardiovascular: Normal rate and regular rhythm.   Pulmonary/Chest: Breath sounds normal. No respiratory distress. She has no wheezes.  Abdominal: Soft. Bowel sounds are normal. There is no tenderness.  Musculoskeletal: She exhibits no edema or tenderness.  Lymphadenopathy:    She has no cervical adenopathy.  Skin: No  rash noted. No erythema.  Psychiatric: She has a normal mood and affect. Her behavior is normal.    BP 112/62 mmHg  Pulse 72  Temp(Src) 98.3 F (36.8 C) (Oral)  Ht 5' 2.5" (1.588 m)  Wt 200 lb 8 oz (90.946 kg)  BMI 36.06 kg/m2  SpO2 97% Wt Readings from Last 3 Encounters:  08/04/15 200 lb 8 oz (90.946 kg)  07/07/15 198 lb 4 oz (89.926 kg)  12/22/14 203 lb 4 oz (92.194 kg)     Lab Results  Component Value Date   WBC 9.4 07/07/2015   HGB 12.8 07/07/2015   HCT 38.1 07/07/2015   PLT 338.0 07/07/2015   GLUCOSE 98 07/07/2015   CHOL 208* 07/07/2015   TRIG 97.0 07/07/2015   HDL 53.00 07/07/2015   LDLDIRECT 157.7 04/21/2013   LDLCALC 135* 07/07/2015   ALT 18 07/07/2015   AST 16 07/07/2015   NA 138 07/07/2015   K 3.8 07/07/2015   CL 101 07/07/2015   CREATININE 0.71 07/07/2015   BUN 12 07/07/2015   CO2 29 07/07/2015   TSH 0.51 07/07/2015    Dg Chest 2 View  03/28/2015  CLINICAL DATA:  Chest tightness.  Persistent asthma. EXAM: CHEST  2 VIEW COMPARISON:  None. FINDINGS: The heart size and mediastinal contours are within normal limits. Both lungs are clear. The visualized skeletal structures are unremarkable. IMPRESSION: No active cardiopulmonary disease. Electronically Signed   By: Charlett NoseKevin  Dover M.D.   On: 03/28/2015 15:38       Assessment & Plan:   Problem List Items Addressed This Visit    Asthma    Breathing stable. Doing well on current regimen.       Environmental allergies    Controlled on current regimen.        Family history of colon cancer    Colonoscopy 11/18/14 as outlined.  Recommended f/u colonoscopy in five years.       GERD (gastroesophageal reflux disease)    On omeprazole.  Controlled.       Hypercholesterolemia    On pravastatin.  Low cholesterol diet and exercise.  Follow lipid panel and liver function tests.        Hypothyroidism - Primary    On thyroid replacement.  Follow tsh.            Dale DurhamSCOTT, Taysen Bushart, MD

## 2015-08-04 NOTE — Progress Notes (Signed)
Pre visit review using our clinic review tool, if applicable. No additional management support is needed unless otherwise documented below in the visit note. 

## 2015-08-05 ENCOUNTER — Encounter: Payer: Self-pay | Admitting: Internal Medicine

## 2015-08-05 NOTE — Assessment & Plan Note (Signed)
Colonoscopy 11/18/14 as outlined.  Recommended f/u colonoscopy in five years.

## 2015-08-05 NOTE — Assessment & Plan Note (Signed)
Controlled on current regimen.   

## 2015-08-05 NOTE — Assessment & Plan Note (Signed)
On pravastatin.  Low cholesterol diet and exercise.  Follow lipid panel and liver function tests.   

## 2015-08-05 NOTE — Assessment & Plan Note (Signed)
Breathing stable.  Doing well on current regimen.   

## 2015-08-05 NOTE — Assessment & Plan Note (Signed)
On thyroid replacement.  Follow tsh.  

## 2015-08-05 NOTE — Assessment & Plan Note (Signed)
On omeprazole.  Controlled.   

## 2016-02-07 ENCOUNTER — Other Ambulatory Visit: Payer: Self-pay | Admitting: Internal Medicine

## 2016-04-09 ENCOUNTER — Encounter: Payer: Self-pay | Admitting: Internal Medicine

## 2016-04-10 NOTE — Telephone Encounter (Signed)
Omeprazole 40 mg , prescribed by historical provider ok to fill?

## 2016-04-12 MED ORDER — OMEPRAZOLE 40 MG PO CPDR: 40.0000 mg | DELAYED_RELEASE_CAPSULE | Freq: Every day | ORAL | 0 refills | Status: DC

## 2016-04-12 NOTE — Telephone Encounter (Signed)
I have sent in the prescription for omeprazole.  Please call her and clarify symptoms.  Will need to schedule an appt for evaluation.

## 2016-04-26 ENCOUNTER — Other Ambulatory Visit: Payer: Self-pay | Admitting: Internal Medicine

## 2016-04-27 ENCOUNTER — Ambulatory Visit (INDEPENDENT_AMBULATORY_CARE_PROVIDER_SITE_OTHER): Payer: Commercial Managed Care - PPO | Admitting: Internal Medicine

## 2016-04-27 ENCOUNTER — Other Ambulatory Visit: Payer: Self-pay

## 2016-04-27 ENCOUNTER — Encounter: Payer: Self-pay | Admitting: Internal Medicine

## 2016-04-27 VITALS — BP 128/84 | HR 66 | Temp 98.8°F | Resp 16 | Wt 208.0 lb

## 2016-04-27 DIAGNOSIS — R1011 Right upper quadrant pain: Secondary | ICD-10-CM | POA: Diagnosis not present

## 2016-04-27 DIAGNOSIS — J4520 Mild intermittent asthma, uncomplicated: Secondary | ICD-10-CM

## 2016-04-27 DIAGNOSIS — K219 Gastro-esophageal reflux disease without esophagitis: Secondary | ICD-10-CM

## 2016-04-27 DIAGNOSIS — E039 Hypothyroidism, unspecified: Secondary | ICD-10-CM | POA: Diagnosis not present

## 2016-04-27 DIAGNOSIS — R109 Unspecified abdominal pain: Secondary | ICD-10-CM | POA: Insufficient documentation

## 2016-04-27 DIAGNOSIS — E7800 Pure hypercholesterolemia, unspecified: Secondary | ICD-10-CM

## 2016-04-27 LAB — CBC WITH DIFFERENTIAL/PLATELET
BASOS PCT: 0.4 % (ref 0.0–3.0)
Basophils Absolute: 0 10*3/uL (ref 0.0–0.1)
EOS PCT: 3.1 % (ref 0.0–5.0)
Eosinophils Absolute: 0.2 10*3/uL (ref 0.0–0.7)
HCT: 37.3 % (ref 36.0–46.0)
Hemoglobin: 12.7 g/dL (ref 12.0–15.0)
LYMPHS ABS: 2.1 10*3/uL (ref 0.7–4.0)
Lymphocytes Relative: 26.1 % (ref 12.0–46.0)
MCHC: 34.1 g/dL (ref 30.0–36.0)
MCV: 85.9 fl (ref 78.0–100.0)
MONO ABS: 0.6 10*3/uL (ref 0.1–1.0)
MONOS PCT: 7.6 % (ref 3.0–12.0)
NEUTROS ABS: 5 10*3/uL (ref 1.4–7.7)
NEUTROS PCT: 62.8 % (ref 43.0–77.0)
PLATELETS: 295 10*3/uL (ref 150.0–400.0)
RBC: 4.35 Mil/uL (ref 3.87–5.11)
RDW: 14.3 % (ref 11.5–15.5)
WBC: 8 10*3/uL (ref 4.0–10.5)

## 2016-04-27 LAB — HEPATIC FUNCTION PANEL
ALBUMIN: 4.3 g/dL (ref 3.5–5.2)
ALT: 19 U/L (ref 0–35)
AST: 20 U/L (ref 0–37)
Alkaline Phosphatase: 77 U/L (ref 39–117)
BILIRUBIN TOTAL: 0.3 mg/dL (ref 0.2–1.2)
Bilirubin, Direct: 0.1 mg/dL (ref 0.0–0.3)
Total Protein: 7.5 g/dL (ref 6.0–8.3)

## 2016-04-27 LAB — BASIC METABOLIC PANEL
BUN: 11 mg/dL (ref 6–23)
CHLORIDE: 105 meq/L (ref 96–112)
CO2: 25 meq/L (ref 19–32)
CREATININE: 0.81 mg/dL (ref 0.40–1.20)
Calcium: 9.2 mg/dL (ref 8.4–10.5)
GFR: 78.7 mL/min (ref >60.00)
Glucose, Bld: 90 mg/dL (ref 70–99)
Potassium: 3.9 mEq/L (ref 3.5–5.1)
SODIUM: 138 meq/L (ref 135–145)

## 2016-04-27 LAB — TSH: TSH: 0.76 u[IU]/mL (ref 0.35–4.50)

## 2016-04-27 MED ORDER — ALBUTEROL SULFATE HFA 108 (90 BASE) MCG/ACT IN AERS: 1.0000 | INHALATION_SPRAY | Freq: Four times a day (QID) | RESPIRATORY_TRACT | 3 refills | Status: DC | PRN

## 2016-04-27 NOTE — Progress Notes (Signed)
Patient ID: Bethany Cook, female   DOB: [birth year], 51 y.o.   MRN: [id number]   Subjective:    Patient ID: Bethany Cook, female    DOB: [birth year], 51 y.o.   MRN: [id number]  HPI  Patient here for a scheduled follow up.  She reports she has been having some RUQ cramps over the last two weeks.  Notices when bend over or stooping over.  Notices with some exercises.  She is going to the gym four days per week.  No chest pain.  No sob.  She has noticed increased problems with acid reflux over this time as well.  Better now.  Was taking otc prilosec.  Is back on her prescription strength prilosec now.  No other abdominal pain.  Bowels stable.  No urine change.  No fever.  She is eating.  Discussed diet and exercise.     Past Medical History:  Diagnosis Date  . Anemia    iron deficient  . Asthma   . Environmental allergies   . Hypercholesterolemia   . Hyperthyroidism    s/p ablation   Past Surgical History:  Procedure Laterality Date  . NO PAST SURGERIES     Family History  Problem Relation Age of Onset  . Colon cancer Father   . Colon cancer Paternal Uncle     x4  . Asthma      cousine  . Breast cancer Neg Hx    Social History   Social History  . Marital status: Single    Spouse name: N/A  . Number of children: 0  . Years of education: N/A   Social History Main Topics  . Smoking status: Never Smoker  . Smokeless tobacco: Never Used  . Alcohol use 0.0 oz/week     Comment: occasional  . Drug use: No  . Sexual activity: Not Asked   Other Topics Concern  . None   Social History Narrative  . None    Outpatient Encounter Prescriptions as of 04/27/2016  Medication Sig  . budesonide-formoterol (SYMBICORT) 160-4.5 MCG/ACT inhaler Inhale 2 puffs into the lungs 2 (two) times daily.  Marland Kitchen escitalopram (LEXAPRO) 10 MG tablet Take 10 mg by mouth daily.  Marland Kitchen levocetirizine (XYZAL) 5 MG tablet Take 5 mg by mouth every evening.  Marland Kitchen levothyroxine (SYNTHROID, LEVOTHROID) 112 MCG  tablet TAKE 1 TABLET BY MOUTH  DAILY  . meclizine (ANTIVERT) 25 MG tablet Take 25 mg by mouth 2 (two) times daily as needed for dizziness. Reported on 07/07/2015  . montelukast (SINGULAIR) 10 MG tablet TAKE 1 TABLET BY MOUTH AT  BEDTIME  . omeprazole (PRILOSEC) 40 MG capsule Take 1 capsule (40 mg total) by mouth daily.  . pravastatin (PRAVACHOL) 10 MG tablet TAKE 1 TABLET BY MOUTH  EVERY DAY  . [DISCONTINUED] albuterol (PROVENTIL HFA;VENTOLIN HFA) 108 (90 BASE) MCG/ACT inhaler Inhale into the lungs every 6 (six) hours as needed for wheezing or shortness of breath.   No facility-administered encounter medications on file as of 04/27/2016.     Review of Systems  Constitutional: Negative for appetite change and unexpected weight change.  HENT: Negative for congestion and sinus pressure.   Respiratory: Negative for cough, chest tightness and shortness of breath.   Cardiovascular: Negative for chest pain, palpitations and leg swelling.  Gastrointestinal: Negative for diarrhea, nausea and vomiting.       RUQ discomfort and cramping as outlined.   Genitourinary: Negative for difficulty urinating and dysuria.  Musculoskeletal: Negative for back pain and  joint swelling.  Skin: Negative for color change and rash.  Neurological: Negative for dizziness, light-headedness and headaches.  Psychiatric/Behavioral: Negative for agitation and dysphoric mood.       Objective:    Physical Exam  Constitutional: She appears well-developed and well-nourished. No distress.  HENT:  Nose: Nose normal.  Mouth/Throat: Oropharynx is clear and moist.  Neck: Neck supple. No thyromegaly present.  Cardiovascular: Normal rate and regular rhythm.   Pulmonary/Chest: Breath sounds normal. No respiratory distress. She has no wheezes.  Abdominal: Soft. Bowel sounds are normal. There is no tenderness.  Musculoskeletal: She exhibits no edema or tenderness.  Lymphadenopathy:    She has no cervical adenopathy.  Skin: No  rash noted. No erythema.  Psychiatric: She has a normal mood and affect. Her behavior is normal.    BP 128/84 (BP Location: Left Arm, Patient Position: Sitting, Cuff Size: Large)   Pulse 66   Temp 98.8 F (37.1 C) (Oral)   Resp 16   Wt 208 lb (94.3 kg)   SpO2 98%   BMI 37.44 kg/m  Wt Readings from Last 3 Encounters:  04/27/16 208 lb (94.3 kg)  08/04/15 200 lb 8 oz (90.9 kg)  07/07/15 198 lb 4 oz (89.9 kg)     Lab Results  Component Value Date   WBC 8.0 04/27/2016   HGB 12.7 04/27/2016   HCT 37.3 04/27/2016   PLT 295.0 04/27/2016   GLUCOSE 90 04/27/2016   CHOL 208 (H) 07/07/2015   TRIG 97.0 07/07/2015   HDL 53.00 07/07/2015   LDLDIRECT 157.7 04/21/2013   LDLCALC 135 (H) 07/07/2015   ALT 19 04/27/2016   AST 20 04/27/2016   NA 138 04/27/2016   K 3.9 04/27/2016   CL 105 04/27/2016   CREATININE 0.81 04/27/2016   BUN 11 04/27/2016   CO2 25 04/27/2016   TSH 0.76 04/27/2016    Dg Chest 2 View  Result Date: 03/28/2015 CLINICAL DATA:  Chest tightness.  Persistent asthma. EXAM: CHEST  2 VIEW COMPARISON:  None. FINDINGS: The heart size and mediastinal contours are within normal limits. Both lungs are clear. The visualized skeletal structures are unremarkable. IMPRESSION: No active cardiopulmonary disease. Electronically Signed   By: Charlett NoseKevin  Dover M.D.   On: 03/28/2015 15:38       Assessment & Plan:   Problem List Items Addressed This Visit    Abdominal pain - Primary    With the RUQ discomfort and cramping as outlined.  Related to certain activities and exercise.  Discussed stretches prior to exercise.  She also has had some worsening symptoms that she has related to acid reflux.  Will check abdominal ultrasound to confirm no gall bladder issues.  Follow.        Relevant Orders   CBC with Differential/Platelet (Completed)   Hepatic function panel (Completed)   Basic metabolic panel (Completed)   US Abdomen Complete   Asthma    Breathing doing well on current regimen.   Follow.       GERD (gastroesophageal reflux disease)    Back on rx strength prilosec now.  Symptoms controlled now.  Discussed not eating late and watching diet.  Follow.       Hypercholesterolemia    Low cholesterol diet and exercise.  Follow lipid panel and liver function tests.  On pravastatin.  Has eaten today.  Unable to check cholesterol today.        Hypothyroidism    On thyroid replacement.  Follow tsh.  Relevant Orders   TSH (Completed)       Dale Joshua, MD

## 2016-04-27 NOTE — Progress Notes (Signed)
Pre visit review using our clinic review tool, if applicable. No additional management support is needed unless otherwise documented below in the visit note. 

## 2016-04-28 ENCOUNTER — Encounter: Payer: Self-pay | Admitting: Internal Medicine

## 2016-04-29 ENCOUNTER — Encounter: Payer: Self-pay | Admitting: Internal Medicine

## 2016-04-29 NOTE — Assessment & Plan Note (Signed)
On thyroid replacement.  Follow tsh.  

## 2016-04-29 NOTE — Assessment & Plan Note (Signed)
With the RUQ discomfort and cramping as outlined.  Related to certain activities and exercise.  Discussed stretches prior to exercise.  She also has had some worsening symptoms that she has related to acid reflux.  Will check abdominal ultrasound to confirm no gall bladder issues.  Follow.

## 2016-04-29 NOTE — Assessment & Plan Note (Signed)
Low cholesterol diet and exercise.  Follow lipid panel and liver function tests.  On pravastatin.  Has eaten today.  Unable to check cholesterol today.

## 2016-04-29 NOTE — Assessment & Plan Note (Signed)
Breathing doing well on current regimen.  Follow.   

## 2016-04-29 NOTE — Assessment & Plan Note (Signed)
Back on rx strength prilosec now.  Symptoms controlled now.  Discussed not eating late and watching diet.  Follow.

## 2016-05-07 ENCOUNTER — Ambulatory Visit
Admission: RE | Admit: 2016-05-07 | Discharge: 2016-05-07 | Disposition: A | Discharge status: Home / Self Care | Payer: Commercial Managed Care - PPO | Source: Ambulatory Visit | Attending: Internal Medicine | Admitting: Internal Medicine

## 2016-05-07 DIAGNOSIS — K760 Fatty (change of) liver, not elsewhere classified: Secondary | ICD-10-CM | POA: Insufficient documentation

## 2016-05-07 DIAGNOSIS — K8020 Calculus of gallbladder without cholecystitis without obstruction: Secondary | ICD-10-CM | POA: Diagnosis not present

## 2016-05-07 DIAGNOSIS — R1011 Right upper quadrant pain: Secondary | ICD-10-CM | POA: Diagnosis present

## 2016-05-09 ENCOUNTER — Other Ambulatory Visit: Payer: Self-pay | Admitting: Internal Medicine

## 2016-05-09 DIAGNOSIS — R1011 Right upper quadrant pain: Secondary | ICD-10-CM

## 2016-05-09 DIAGNOSIS — K8020 Calculus of gallbladder without cholecystitis without obstruction: Secondary | ICD-10-CM

## 2016-05-09 NOTE — Progress Notes (Signed)
Order placed for general surgery referral.   

## 2016-05-15 ENCOUNTER — Encounter: Payer: Self-pay | Admitting: Internal Medicine

## 2016-05-15 NOTE — Telephone Encounter (Signed)
This pt sent a my chart message about her surgery referral. It appears we have sent the info.  Do you mind giving her an update on the appt.  Thanks

## 2016-05-22 NOTE — Telephone Encounter (Signed)
Letter mailed with information

## 2016-05-31 ENCOUNTER — Other Ambulatory Visit: Payer: Self-pay | Admitting: Internal Medicine

## 2016-06-07 LAB — HM MAMMOGRAPHY

## 2016-06-07 LAB — HM PAP SMEAR

## 2016-07-10 ENCOUNTER — Other Ambulatory Visit: Payer: Self-pay | Admitting: Internal Medicine

## 2016-09-24 ENCOUNTER — Emergency Department (HOSPITAL_COMMUNITY)
Admission: EM | Admit: 2016-09-24 | Discharge: 2016-09-24 | Disposition: A | Discharge status: Home / Self Care | Payer: Commercial Managed Care - PPO | Attending: Emergency Medicine | Admitting: Emergency Medicine

## 2016-09-24 ENCOUNTER — Emergency Department (HOSPITAL_COMMUNITY): Payer: Commercial Managed Care - PPO

## 2016-09-24 ENCOUNTER — Encounter (HOSPITAL_COMMUNITY): Payer: Self-pay | Admitting: *Deleted

## 2016-09-24 ENCOUNTER — Encounter: Payer: Self-pay | Admitting: Internal Medicine

## 2016-09-24 DIAGNOSIS — J45909 Unspecified asthma, uncomplicated: Secondary | ICD-10-CM | POA: Diagnosis not present

## 2016-09-24 DIAGNOSIS — M549 Dorsalgia, unspecified: Secondary | ICD-10-CM

## 2016-09-24 DIAGNOSIS — R079 Chest pain, unspecified: Secondary | ICD-10-CM | POA: Insufficient documentation

## 2016-09-24 DIAGNOSIS — R911 Solitary pulmonary nodule: Secondary | ICD-10-CM | POA: Insufficient documentation

## 2016-09-24 DIAGNOSIS — E0590 Thyrotoxicosis, unspecified without thyrotoxic crisis or storm: Secondary | ICD-10-CM | POA: Diagnosis not present

## 2016-09-24 LAB — LIPASE, BLOOD: Lipase: 38 U/L (ref 11–51)

## 2016-09-24 LAB — CBC WITH DIFFERENTIAL/PLATELET
Basophils Absolute: 0.1 10*3/uL (ref 0.0–0.1)
Basophils Relative: 1 %
EOS ABS: 0.2 10*3/uL (ref 0.0–0.7)
Eosinophils Relative: 2 %
HEMATOCRIT: 40.4 % (ref 36.0–46.0)
HEMOGLOBIN: 13.3 g/dL (ref 12.0–15.0)
LYMPHS ABS: 1.5 10*3/uL (ref 0.7–4.0)
LYMPHS PCT: 15 %
MCH: 28.6 pg (ref 26.0–34.0)
MCHC: 32.9 g/dL (ref 30.0–36.0)
MCV: 86.9 fL (ref 78.0–100.0)
Monocytes Absolute: 0.5 10*3/uL (ref 0.1–1.0)
Monocytes Relative: 5 %
NEUTROS ABS: 7.7 10*3/uL (ref 1.7–7.7)
NEUTROS PCT: 77 %
Platelets: 255 10*3/uL (ref 150–400)
RBC: 4.65 MIL/uL (ref 3.87–5.11)
RDW: 13.7 % (ref 11.5–15.5)
WBC: 10 10*3/uL (ref 4.0–10.5)

## 2016-09-24 LAB — COMPREHENSIVE METABOLIC PANEL
ALBUMIN: 4.1 g/dL (ref 3.5–5.0)
ALT: 20 U/L (ref 14–54)
ANION GAP: 12 (ref 5–15)
AST: 27 U/L (ref 15–41)
Alkaline Phosphatase: 80 U/L (ref 38–126)
BUN: 12 mg/dL (ref 6–20)
CHLORIDE: 100 mmol/L — AB (ref 101–111)
CO2: 23 mmol/L (ref 22–32)
Calcium: 9.4 mg/dL (ref 8.9–10.3)
Creatinine, Ser: 0.89 mg/dL (ref 0.44–1.00)
GFR calc non Af Amer: >60 mL/min (ref >60)
GLUCOSE: 110 mg/dL — AB (ref 65–99)
POTASSIUM: 4.8 mmol/L (ref 3.5–5.1)
Sodium: 135 mmol/L (ref 135–145)
Total Bilirubin: 0.5 mg/dL (ref 0.3–1.2)
Total Protein: 7.5 g/dL (ref 6.5–8.1)

## 2016-09-24 LAB — I-STAT TROPONIN, ED
TROPONIN I, POC: 0.01 ng/mL (ref 0.00–0.08)
TROPONIN I, POC: 0 ng/mL (ref 0.00–0.08)

## 2016-09-24 LAB — URINALYSIS, ROUTINE W REFLEX MICROSCOPIC
Bilirubin Urine: NEGATIVE
Glucose, UA: NEGATIVE mg/dL
Hgb urine dipstick: NEGATIVE
KETONES UR: NEGATIVE mg/dL
LEUKOCYTES UA: NEGATIVE
NITRITE: NEGATIVE
PH: 7 (ref 5.0–8.0)
Protein, ur: NEGATIVE mg/dL
Specific Gravity, Urine: 1.012 (ref 1.005–1.030)

## 2016-09-24 LAB — D-DIMER, QUANTITATIVE: D-Dimer, Quant: 1.09 ug/mL-FEU — ABNORMAL HIGH (ref 0.00–0.50)

## 2016-09-24 MED ORDER — MORPHINE SULFATE (PF) 4 MG/ML IV SOLN
4.0000 mg | Freq: Once | INTRAVENOUS | Status: AC
  Administered 2016-09-24: 4 mg via INTRAVENOUS
  Filled 2016-09-24: qty 1

## 2016-09-24 MED ORDER — IOPAMIDOL (ISOVUE-370) INJECTION 76%
INTRAVENOUS | Status: AC
  Administered 2016-09-24: 100 mL
  Filled 2016-09-24: qty 100

## 2016-09-24 MED ORDER — ONDANSETRON HCL 4 MG/2ML IJ SOLN
4.0000 mg | Freq: Once | INTRAMUSCULAR | Status: AC
  Administered 2016-09-24: 4 mg via INTRAVENOUS
  Filled 2016-09-24: qty 2

## 2016-09-24 MED ORDER — SODIUM CHLORIDE 0.9 % IV BOLUS (SEPSIS)
1000.0000 mL | Freq: Once | INTRAVENOUS | Status: AC
  Administered 2016-09-24: 1000 mL via INTRAVENOUS

## 2016-09-24 NOTE — Discharge Instructions (Addendum)
You were seen in the emergency department for chest pain and back pain. Please follow up with your primary care doctor in 3 days for a re-evaluation and follow up. Take Tylenol as needed for pain--follow over the counter label instructions. Your chest CT scan showed a right middle lobe 8 mm lung nodule, and possible small nodules in the posterior right upper lobe are nonspecific. Please have your primary care doctor repeat a non-contrast chest CT at 3-6 months. If the nodules are stable at time of repeat CT, then future CT at 18-24 months (from today's scan) is considered optional for low-risk patients.  Return to the ER if you experience fevers, chills, unexplained weight loss, sudden or severe headaches, neck pain, dizziness, vision or gait changes, change in/persistent/worsening chest pain, shortness of breath, abdominal pain, nausea, vomiting, bladder or bowel dysfunction, numbness to groin, extremity numbness or tingling, extremity weakness, worsening symptoms, or any additional concerns.

## 2016-09-24 NOTE — ED Triage Notes (Signed)
To ED for eval of midsternal cp starting this am that radiates to her back and radiates under right breast. Pt states she was seen by surgeon for possible gallbladder a few months ago. Nausea. No vomiting.

## 2016-09-24 NOTE — ED Provider Notes (Signed)
[facility] Emergency Department Provider Note  ED Clinical Impression   Chest pain, unspecified type  Acute right-sided back pain, unspecified back location  Pulmonary nodule  History   Chief Complaint Back Pain and Chest Pain   HPI  Patient is a 51 y.o. female with a PMH of anemia, asthma, hyperthyroidism, and hypercholesterolemia who presents to ED for midsternal chest pain, onset this morning, describes as sharp, constant, radiates to right upper abdomen and right upper back, with associated nausea, no known exacerbating or alleviating factors, has not tried OTC meds. States that she experienced a similar episode earlier this week, lasted a few hours and resolved. Reports that she was previously diagnosed with gallstones after experiencing some RUQ abd pain, but did not have radiation of pain at that time. Denies previous abdominal surgeries. Denies family hx of CAD, no tobacco use. Denies fever, chills, unexplained weight loss, dizziness, vision or gait changes, headaches, neck pain, SOB, cough, hemoptysis, pleurisy, vomiting, diarrhea, melena, dysuria, hematuria, extremity numbness or tingling, extremity weakness, or any additional concerns. No recent travel, prolonged immobilization, estrogen use, recent surgery, hx of malignancy, or hx of DVT/PE. No anticoagulant use.   Past Medical History:  Diagnosis Date  . Anemia    iron deficient  . Asthma   . Environmental allergies   . Hypercholesterolemia   . Hyperthyroidism    s/p ablation    Past Surgical History:  Procedure Laterality Date  . NO PAST SURGERIES      Current Outpatient Rx  . Order #: [id number] Class: Normal  . Order #: [id number] Class: Historical Med  . Order #: [id number] Class: Historical Med  . Order #: [id number] Class: Historical Med  . Order #: [id number] Class: Historical Med  . Order #: [id number] Class: Normal  . Order #: [id number] Class: Normal  . Order #: [id number] Class: Normal  . Order #: [id number] Class:  Normal    Allergies Erythromycin  Family History  Problem Relation Age of Onset  . Colon cancer Father   . Colon cancer Paternal Uncle        x4  . Asthma Unknown        cousine  . Breast cancer Neg Hx     Social History Social History  Substance Use Topics  . Smoking status: Never Smoker  . Smokeless tobacco: Never Used  . Alcohol use 0.0 oz/week     Comment: occasional    Review of Systems  Constitutional: Negative for fever, chills, or unexplained weight loss. Eyes: Negative for visual changes. ENT: Negative for nasal congestion, ear pain, or sore throat. Cardiovascular: +chest pain. Negative for palpitations or extremity swelling. Respiratory: Negative for shortness of breath or cough. Negative for hemoptysis or pleurisy.  Gastrointestinal: +nausea. Negative for abdominal pain, vomiting, or diarrhea. Genitourinary: Negative for dysuria, urinary frequency, or hematuria. Musculoskeletal: +back pain. Negative for extremity pain/swelling. Skin: Negative for rash. Neurological: Negative for headaches, dizziness, focal weakness, or numbness/tingling.  Physical Exam   VITAL SIGNS:   ED Triage Vitals  Enc Vitals Group     BP 09/24/16 1016 126/87     Pulse Rate 09/24/16 1016 72     Resp 09/24/16 1016 16     Temp 09/24/16 1016 98.5 F (36.9 C)     Temp Source 09/24/16 1016 Oral     SpO2 09/24/16 1016 96 %     Weight --      Height --      Head Circumference --      Peak Flow --  Pain Score 09/24/16 1021 8     Pain Loc --      Pain Edu? --      Excl. in GC? --     Constitutional: Alert and oriented. Well appearing and in no respiratory apparent distress. Eyes: PERRL, EOMI, Conjunctivae normal ENT      Head: Normocephalic and atraumatic.      Ears: TM intact bilaterally without erythema or effusion, no hemotympanum, external ear canals normal.       Mouth/Throat: Mucous membranes are moist. Oropharynx without erythema or exudate. Normal voice, handling  secretions normally.      Neck: Supple, no nuchal signs, full active ROM of neck. No JVD.  Cardiovascular: Normal S1 S2, regular rhythm, normal rate. Normal and symmetric distal pulses are present in all extremities. No chest wall tenderness. Respiratory: Breath sounds clear and equal bilaterally. No wheezes, rales, or rhonchi. Normal respiratory effort.  Gastrointestinal: +RUQ abd mildly ttp, abdomen soft. Negative Murphys. No rebound or guarding. There is no CVA tenderness. Negative Mcburneys.  Back: +R ltrapezius ttp, no spinal midline tenderness, no stepoff. No rash noted.  Musculoskeletal: Nontender with normal range of motion in all extremities.      Right lower leg: No tenderness or edema.      Left lower leg: No tenderness or edema. Neurologic: Speech clear. Alert and appropriate, no gross focal neurologic deficits are appreciated. No facial droop, finger to nose intact. Negative pronator. Gait steady with ambulation. Equal strength in all four extremities. Extremities neurovascularly intact.  Skin: Skin is warm, dry, and intact. No rash noted. Psychiatric: Mood and affect are normal. Speech and behavior are normal.  Labs   Labs Reviewed  COMPREHENSIVE METABOLIC PANEL  LIPASE, BLOOD  CBC WITH DIFFERENTIAL/PLATELET  URINALYSIS, ROUTINE W REFLEX MICROSCOPIC  I-STAT TROPOININ, ED    Radiology   DG Chest 2 View    (Results Pending)    EKG   EKG: normal sinus rhythm with ventricular rate of 68 bpm, no STEMI.    ED Course, Assessment and Plan   Pt is a 51 y/o F, afebrile who presents to ED for midsternal chest pain radiating to right upper abdomen and back, concern for cholelithiasis vs. cholecystitis vs. ACS vs. PE vs. dissection vs. GERD vs. musculoskeletal pain. EKG with no acute ischemic changes. Low suspicion for Pulmonary Embolism - no hemoptysis, pain is not pleuritic, no recent surgery, no history of a hypercoagulable state, negative Homan's sign, no unilateral lower  extremity edema, no recent travel or immobilization. Doubt Pericarditis,or pericardial effusion with tamponade - no fever, no frictional rub, no hypotension, no muffled heart sounds, no JVD. On EKG there is no PR depression or diffuse ST elevation. Doubt pneumothorax - breath sounds clear and equal bilaterally, no recent central line or traumatic injury. Doubt aortic dissection - pain is not ripping or tearing, not worst at onset, peripheral pulse strong and equal, no aortic regurgitation murmur. Plan to get labs for electrolyte abnormality, troponin, CXR for acute pulmonary process, and RUQ abdominal ultrasound for cholelithiasis/cholecystitis.    12:02 PM CBC and CMP unremarkable, lipase normal. Will get ddimer to assess for possible PE although low suspicion.  1:21 PM Ddimer elevated at 1.09. Will get CTA chest to r/o PE. RUQ u/s with cholelithiasis, no cholecystitis.   3:12 PM CTA chest negative for PE but does note right middle lobe 8 mm lung nodule, and possible small nodules in the posterior right upper lobe are nonspecific. Non-contrast chest CT at 3-6 months  is recommended. Discussed results with patient as well as need for repeat chest CTs.   4:43 PM Second troponin negative. Discussed results, discharge instructions, rx and safety, return precautions, and follow up. Pt verbalizes understanding using verbal teachback and agrees with plan, denies any additional concerns.   The patient was discussed with Dr. Erma Heritage.  Previous chart, nursing notes, and vital signs reviewed.    Pertinent labs & imaging results that were available during my care of the patient were reviewed by me and considered in my medical decision making (see chart for details).   Wojeck, Hinton Dyer, NP 09/28/16 1347    Shaune Pollack, MD 09/28/16 [phone]

## 2016-09-24 NOTE — ED Notes (Signed)
Pt ambulated to restroom from room, tolerated well. 

## 2016-09-25 NOTE — Telephone Encounter (Signed)
Bethany Cook, this is the pt that you spoke to 09/25/16 and offered to schedule a f/u appt this week.  Per your note, she had wanted to f/u at a later date and did not feel urgent.  I can see her 10/15/16 at 12:00.  If needs to be seen earlier, let me know.

## 2016-09-26 NOTE — Telephone Encounter (Signed)
Called patient this morning she stated she feels better no nausea just upper abdominal soreness, was appreciative of appointment date and time, patient has been scheduled. Advised patient to call if symptoms worsen or if cannot reach office to return to ED in meantime.

## 2016-10-24 ENCOUNTER — Ambulatory Visit (INDEPENDENT_AMBULATORY_CARE_PROVIDER_SITE_OTHER): Payer: Commercial Managed Care - PPO | Admitting: Internal Medicine

## 2016-10-24 ENCOUNTER — Encounter: Payer: Self-pay | Admitting: Internal Medicine

## 2016-10-24 DIAGNOSIS — E7800 Pure hypercholesterolemia, unspecified: Secondary | ICD-10-CM

## 2016-10-24 DIAGNOSIS — K8020 Calculus of gallbladder without cholecystitis without obstruction: Secondary | ICD-10-CM | POA: Diagnosis not present

## 2016-10-24 DIAGNOSIS — Z9109 Other allergy status, other than to drugs and biological substances: Secondary | ICD-10-CM | POA: Diagnosis not present

## 2016-10-24 DIAGNOSIS — R911 Solitary pulmonary nodule: Secondary | ICD-10-CM

## 2016-10-24 DIAGNOSIS — K219 Gastro-esophageal reflux disease without esophagitis: Secondary | ICD-10-CM

## 2016-10-24 DIAGNOSIS — E039 Hypothyroidism, unspecified: Secondary | ICD-10-CM

## 2016-10-24 DIAGNOSIS — J4520 Mild intermittent asthma, uncomplicated: Secondary | ICD-10-CM

## 2016-10-24 DIAGNOSIS — K760 Fatty (change of) liver, not elsewhere classified: Secondary | ICD-10-CM

## 2016-10-24 NOTE — Patient Instructions (Signed)
Take the probiotic and the fiber daily as we discussed.

## 2016-10-24 NOTE — Progress Notes (Signed)
Patient ID: Bethany Cook, female   DOB: 1966-02-20, 51 y.o.   MRN: [id number]   Subjective:    Patient ID: Bethany Cook, female    DOB: Mar 25, 1965, 51 y.o.   MRN: [id number]  HPI  Patient here for a scheduled follow up. She was evaluated in the ER 09/28/16 for chest pain and RUQ pain and right upper back pain.  Note reviewed.  Had associated nausea.  CBC and CMP unremarkable.  Lipase normal.  D dimer - 1.09.  CTA chest negative for PE.  Found to have RML 8mm lung nodule and possible small nodules in the posterior right upper lobe - non specific.  Abdominal ultrasound - cholelithiasis, but no acute cholecystitis.  Diffuse fatty liver found.  Pain resolved shortly after ER visit.  Has not had recurrence.  No chest pain.  Breathing stable.  Eating and drinking well.  No vomiting or nausea.  No abdominal pain.  Bowels moving.  Increased stress with some relationship issues.  Overall she feels she is handling things relatively well.  She is going to school and working now.  Increased stress with this as well.  Does not feel needs anything further at this time.  She is starting to walk.  Discussed diet and exercise.  Weight is up.    Past Medical History:  Diagnosis Date  . Anemia    iron deficient  . Asthma   . Environmental allergies   . Hypercholesterolemia   . Hyperthyroidism    s/p ablation   Past Surgical History:  Procedure Laterality Date  . NO PAST SURGERIES     Family History  Problem Relation Age of Onset  . Colon cancer Father   . Colon cancer Paternal Uncle        x4  . Asthma Unknown        cousine  . Breast cancer Neg Hx    Social History   Social History  . Marital status: Single    Spouse name: N/A  . Number of children: 0  . Years of education: N/A   Social History Main Topics  . Smoking status: Never Smoker  . Smokeless tobacco: Never Used  . Alcohol use 0.0 oz/week     Comment: occasional  . Drug use: No  . Sexual activity: Not Asked   Other Topics  Concern  . None   Social History Narrative  . None    Outpatient Encounter Prescriptions as of 10/24/2016  Medication Sig  . albuterol (PROVENTIL HFA;VENTOLIN HFA) 108 (90 Base) MCG/ACT inhaler Inhale 1 puff into the lungs every 6 (six) hours as needed for wheezing or shortness of breath.  . budesonide-formoterol (SYMBICORT) 160-4.5 MCG/ACT inhaler Inhale 2 puffs into the lungs 2 (two) times daily.  Marland Kitchen escitalopram (LEXAPRO) 10 MG tablet Take 10 mg by mouth at bedtime.   Marland Kitchen levocetirizine (XYZAL) 5 MG tablet Take 5 mg by mouth every evening.  Marland Kitchen levothyroxine (SYNTHROID, LEVOTHROID) 112 MCG tablet TAKE 1 TABLET BY MOUTH  DAILY  . levothyroxine (SYNTHROID, LEVOTHROID) 125 MCG tablet Take 125 mcg by mouth daily before breakfast.  . montelukast (SINGULAIR) 10 MG tablet TAKE 1 TABLET BY MOUTH AT  BEDTIME (Patient taking differently: TAKE 10 MG BY MOUTH AT  BEDTIME)  . omeprazole (PRILOSEC) 40 MG capsule TAKE 1 CAPSULE BY MOUTH  DAILY (Patient taking differently: TAKE 40MG  BY MOUTH  DAILY)  . pravastatin (PRAVACHOL) 10 MG tablet TAKE 1 TABLET BY MOUTH  EVERY DAY (Patient taking differently: TAKE  10 MG BY MOUTH  EVERY NIGHT)   No facility-administered encounter medications on file as of 10/24/2016.     Review of Systems  Constitutional: Negative for appetite change.       Weight is up.   HENT: Negative for congestion and sinus pressure.   Respiratory: Negative for cough, chest tightness and shortness of breath.   Cardiovascular: Negative for chest pain, palpitations and leg swelling.       No chest pain since her ER visit.   Gastrointestinal: Negative for abdominal pain, diarrhea, nausea and vomiting.  Genitourinary: Negative for difficulty urinating and dysuria.  Musculoskeletal: Negative for joint swelling.       No back pain now.    Skin: Negative for color change and rash.  Neurological: Negative for dizziness, light-headedness and headaches.  Psychiatric/Behavioral: Negative for  agitation and dysphoric mood.       Increased stress as outlined.        Objective:    Physical Exam  Constitutional: She appears well-developed and well-nourished. No distress.  HENT:  Nose: Nose normal.  Mouth/Throat: Oropharynx is clear and moist.  Neck: Neck supple. No thyromegaly present.  Cardiovascular: Normal rate and regular rhythm.   Pulmonary/Chest: Breath sounds normal. No respiratory distress. She has no wheezes.  Abdominal: Soft. Bowel sounds are normal. There is no tenderness.  Musculoskeletal: She exhibits no edema or tenderness.  Lymphadenopathy:    She has no cervical adenopathy.  Skin: No rash noted. No erythema.  Psychiatric: She has a normal mood and affect. Her behavior is normal.    BP 136/72 (BP Location: Left Arm, Patient Position: Sitting, Cuff Size: Normal)   Pulse 69   Temp 98.6 F (37 C) (Oral)   Resp 12   Ht 5\' 3"  (1.6 m)   Wt 210 lb 6.4 oz (95.4 kg)   SpO2 97%   BMI 37.27 kg/m  Wt Readings from Last 3 Encounters:  10/24/16 210 lb 6.4 oz (95.4 kg)  04/27/16 208 lb (94.3 kg)  08/04/15 200 lb 8 oz (90.9 kg)     Lab Results  Component Value Date   WBC 10.0 09/24/2016   HGB 13.3 09/24/2016   HCT 40.4 09/24/2016   PLT 255 09/24/2016   GLUCOSE 110 (H) 09/24/2016   CHOL 208 (H) 07/07/2015   TRIG 97.0 07/07/2015   HDL 53.00 07/07/2015   LDLDIRECT 157.7 04/21/2013   LDLCALC 135 (H) 07/07/2015   ALT 20 09/24/2016   AST 27 09/24/2016   NA 135 09/24/2016   K 4.8 09/24/2016   CL 100 (L) 09/24/2016   CREATININE 0.89 09/24/2016   BUN 12 09/24/2016   CO2 23 09/24/2016   TSH 0.76 04/27/2016    Dg Chest 2 View  Result Date: 09/24/2016 CLINICAL DATA:  Chest pain beginning today. EXAM: CHEST  2 VIEW COMPARISON:  Two-view chest x-ray 03/28/2015 FINDINGS: The heart size is normal. Mild pulmonary vascular congestion is present on today's study. There is no definite edema. No effusions are present. There is no significant airspace consolidation.  The visualized soft tissues and bony thorax are unremarkable. IMPRESSION: 1. Mild pulmonary vascular congestion is new. 2. No other focal airspace disease. Electronically Signed   By: Marin Robertshristopher  Mattern M.D.   On: 09/24/2016 11:21   Ct Angio Chest Pe W/cm &/or Wo Cm  Result Date: 09/24/2016 CLINICAL DATA:  51 year old female with midsternal chest pain radiating to the back and under the right breast since this morning. Nausea. Abnormal D-dimer. EXAM: CT ANGIOGRAPHY  CHEST WITH CONTRAST TECHNIQUE: Multidetector CT imaging of the chest was performed using the standard protocol during bolus administration of intravenous contrast. Multiplanar CT image reconstructions and MIPs were obtained to evaluate the vascular anatomy. CONTRAST:  65 mL Isovue 370 COMPARISON:  Chest radiographs 1108 hours today and earlier. FINDINGS: Cardiovascular: Good contrast bolus timing in the pulmonary arterial tree. Mild respiratory motion. No focal filling defect identified in the pulmonary arteries to suggest acute pulmonary embolism. Negative visualized aorta. Small volume of pericardial fluid in the superior pericardial recess, significance doubtful. No cardiomegaly. There is evidence of calcified coronary artery atherosclerosis. Mediastinum/Nodes: Negative.  No lymphadenopathy. Lungs/Pleura: Low lung volumes with dependent atelectasis. Major airways remain patent. There is an 8 mm lung nodule in the right middle lobe (Series 7, image 50). There are also possible small lung nodules in the posterior right upper lobe (images 33 and 34). No consolidation or pleural effusion. No other abnormal pulmonary opacity. Upper Abdomen: Negative visible liver, spleen, pancreas, adrenal glands, kidneys, and bowel in the upper abdomen. Musculoskeletal: No acute osseous abnormality identified. Review of the MIP images confirms the above findings. IMPRESSION: 1.  No evidence of acute pulmonary embolus. 2. Right middle lobe 8 mm lung nodule, and  possible small nodules in the posterior right upper lobe are nonspecific. Non-contrast chest CT at 3-6 months is recommended. If the nodules are stable at time of repeat CT, then future CT at 18-24 months (from today's scan) is considered optional for low-risk patients, but is recommended for high-risk patients. This recommendation follows the consensus statement: Guidelines for Management of Incidental Pulmonary Nodules Detected on CT Images: From the Fleischner Society 2017; Radiology 2017; 284:228-243. 3. Calcified coronary artery atherosclerosis. Negative visualized aorta. Electronically Signed   By: Odessa Fleming M.D.   On: 09/24/2016 15:07   US Abdomen Limited  Result Date: 09/24/2016 CLINICAL DATA:  Right upper quadrant pain since this morning. EXAM: ULTRASOUND ABDOMEN LIMITED RIGHT UPPER QUADRANT COMPARISON:  05/07/2016 FINDINGS: Gallbladder: Cholelithiasis again demonstrated. The largest calculus measures 1.1 cm. No gallbladder wall thickening, pericholecystic fluid or sonographic Murphy sign to suggest acute cholecystitis. Common bile duct: Diameter: 5.7 mm Liver: There is diffuse increased echogenicity of the liver and decreased through transmission consistent with fatty infiltration. No focal lesions or biliary dilatation. IMPRESSION: 1. Cholelithiasis but no sonographic findings for acute cholecystitis. 2. Normal caliber common bile duct. 3. Diffuse fatty infiltration of the liver. Electronically Signed   By: Rudie Meyer M.D.   On: 09/24/2016 13:09       Assessment & Plan:   Problem List Items Addressed This Visit    Asthma    Breathing stable.       Cholelithiasis    Evaluated by surgery (Dr Gerrit Friends).  Did not feel further intervention warranted at this time.  Follow.  Recent ER evaluation as outlined.  Discussed with her today.  She desires no further intervention.       Environmental allergies    Controlled on current regimen.        Fatty liver    Low carb diet and exercise.   Weight loss.  Follow liver function tests.        GERD (gastroesophageal reflux disease)    Controlled on omeprazole.        Hypercholesterolemia    Low cholesterol diet and exercise.  Follow lipid panel and liver function tests.  On pravastatin.        Hypothyroidism    On thyroid replacement.  Follow  tsh.       Lung nodule    Recent CT - as outlined.  Lung nodule(s) found.  Discussed with her today.  Will refer to Dr Inez Catalina for evaluation and further w/up.        Relevant Orders   Consult to cardiothoracic Surgery     I spent 40 minutes with the patient and more than 50% of the time was spent in consultation regarding the above.  Time spent obtaining history, discussing recent ER evaluation, current symptoms and plan for further evaluation and w/up.    Dale Garrison, MD

## 2016-10-24 NOTE — Progress Notes (Signed)
Pre-visit discussion using our clinic review tool. No additional management support is needed unless otherwise documented below in the visit note.  

## 2016-10-27 ENCOUNTER — Encounter: Payer: Self-pay | Admitting: Internal Medicine

## 2016-10-27 DIAGNOSIS — R911 Solitary pulmonary nodule: Secondary | ICD-10-CM

## 2016-10-27 DIAGNOSIS — K8020 Calculus of gallbladder without cholecystitis without obstruction: Secondary | ICD-10-CM | POA: Insufficient documentation

## 2016-10-27 DIAGNOSIS — K760 Fatty (change of) liver, not elsewhere classified: Secondary | ICD-10-CM | POA: Insufficient documentation

## 2016-10-27 HISTORY — DX: Solitary pulmonary nodule: R91.1

## 2016-10-27 HISTORY — DX: Fatty (change of) liver, not elsewhere classified: K76.0

## 2016-10-27 HISTORY — DX: Calculus of gallbladder without cholecystitis without obstruction: K80.20

## 2016-10-27 NOTE — Assessment & Plan Note (Signed)
Low carb diet and exercise.  Weight loss.  Follow liver function tests.   

## 2016-10-27 NOTE — Assessment & Plan Note (Signed)
Low cholesterol diet and exercise.  Follow lipid panel and liver function tests.  On pravastatin.   

## 2016-10-27 NOTE — Assessment & Plan Note (Signed)
On thyroid replacement.  Follow tsh.  

## 2016-10-27 NOTE — Assessment & Plan Note (Signed)
Controlled on omeprazole.   

## 2016-10-27 NOTE — Assessment & Plan Note (Signed)
Evaluated by surgery (Dr Gerrit Friends).  Did not feel further intervention warranted at this time.  Follow.  Recent ER evaluation as outlined.  Discussed with her today.  She desires no further intervention.

## 2016-10-27 NOTE — Assessment & Plan Note (Signed)
Controlled on current regimen.   

## 2016-10-27 NOTE — Assessment & Plan Note (Signed)
Recent CT - as outlined.  Lung nodule(s) found.  Discussed with her today.  Will refer to Dr Inez Catalina for evaluation and further w/up.

## 2016-10-27 NOTE — Assessment & Plan Note (Signed)
Breathing stable.

## 2016-11-05 ENCOUNTER — Ambulatory Visit: Payer: Commercial Managed Care - PPO | Admitting: Internal Medicine

## 2016-11-28 ENCOUNTER — Telehealth: Payer: Self-pay | Admitting: *Deleted

## 2016-11-28 ENCOUNTER — Ambulatory Visit (HOSPITAL_COMMUNITY)
Admission: EM | Admit: 2016-11-28 | Discharge: 2016-11-28 | Disposition: A | Discharge status: Home / Self Care | Payer: Commercial Managed Care - PPO | Attending: Family Medicine | Admitting: Family Medicine

## 2016-11-28 ENCOUNTER — Encounter (HOSPITAL_COMMUNITY): Payer: Self-pay | Admitting: *Deleted

## 2016-11-28 DIAGNOSIS — K5732 Diverticulitis of large intestine without perforation or abscess without bleeding: Secondary | ICD-10-CM | POA: Diagnosis not present

## 2016-11-28 MED ORDER — METRONIDAZOLE 500 MG PO TABS: 500.0000 mg | ORAL_TABLET | Freq: Three times a day (TID) | ORAL | 0 refills | Status: DC

## 2016-11-28 MED ORDER — CIPROFLOXACIN HCL 500 MG PO TABS: 500.0000 mg | ORAL_TABLET | Freq: Two times a day (BID) | ORAL | 0 refills | Status: DC

## 2016-11-28 NOTE — ED Triage Notes (Signed)
Pain  l  Lower  abd  X    2   Days   Started    In  Middle  And   Radiated  To  l   Side       No  Nausea   No  Vomiting    Feels   Bloated   As   Well     History  Of  Gallstones

## 2016-11-28 NOTE — Telephone Encounter (Signed)
The only referral in chart is to general surgery. She does not have a GI doctor. She is wanting to know who you recommend and if you could place referral. In the mean time, I will advise patient to go to walk in clinic to be evaluated today.

## 2016-11-28 NOTE — ED Provider Notes (Signed)
[facility]   [id number] 11/28/16 Arrival Time: 1113  ASSESSMENT & PLAN:  1. Diverticulitis of colon    Meds ordered this encounter  Medications  . ciprofloxacin (CIPRO) 500 MG tablet    Sig: Take 1 tablet (500 mg total) by mouth 2 (two) times daily.    Dispense:  20 tablet    Refill:  0  . metroNIDAZOLE (FLAGYL) 500 MG tablet    Sig: Take 1 tablet (500 mg total) by mouth 3 (three) times daily.    Dispense:  30 tablet    Refill:  0   She agrees to proceed to the ED if any worsening symptoms. Tylenol if needed.  Ensure adequate fluid intake. Reviewed expectations re: course of current medical issues. Questions answered. Outlined signs and symptoms indicating need for more acute intervention. Patient verbalized understanding. After Visit Summary given.   SUBJECTIVE:  Bethany Cook is a 51 y.o. female who presents with complaint of abdominal discomfort. Onset gradual, 2 days ago. Described as dull. Location: LLQ without radiation. Described symptoms are unchanged since beginning. Aggravating factors: none. Alleviating factors: none. Associated symptoms: slightly decreased appetite. The patient denies belching, constipation, diarrhea, dysuria, fever, hematochezia, hematuria, nausea and vomiting. Ambulatory without assistance. Urinary symptoms: none. OTC treatment: none.  No LMP recorded. Patient has had an ablation.  History of similar symptoms: Yes; history of diverticulitis in the past.  Past Surgical History:  Procedure Laterality Date  . NO PAST SURGERIES     ROS: As per HPI. All other systems negative.  OBJECTIVE:  Vitals:   11/28/16 1219  BP: 132/86  Pulse: 88  Resp: 20  Temp: 99.2 F (37.3 C)  TempSrc: Oral  SpO2: 100%    General appearance: alert; no distress Lungs: clear to auscultation bilaterally Heart: regular rate and rhythm Abdomen: soft; non-distended; mild tenderness over LLQ; bowel sounds present; no masses or organomegaly; no  guarding or rebound tenderness Back: no CVA tenderness Extremities: no cyanosis or edema; symmetrical with no gross deformities Skin: warm and dry Neurologic: normal gait Psychological: alert and cooperative; normal mood and affect   Allergies  Allergen Reactions  . Erythromycin Rash                                               Past Medical History:  Diagnosis Date  . Anemia    iron deficient  . Asthma   . Environmental allergies   . Hypercholesterolemia   . Hyperthyroidism    s/p ablation   Social History   Social History  . Marital status: Single    Spouse name: N/A  . Number of children: 0  . Years of education: N/A   Occupational History  . Not on file.   Social History Main Topics  . Smoking status: Never Smoker  . Smokeless tobacco: Never Used  . Alcohol use 0.0 oz/week     Comment: occasional  . Drug use: No  . Sexual activity: Not on file   Other Topics Concern  . Not on file   Social History Narrative  . No narrative on file   Family History  Problem Relation Age of Onset  . Colon cancer Father   . Colon cancer Paternal Uncle        x4  . Asthma Unknown        cousine  . Breast cancer  Neg Hx      Mardella Layman, MD 11/28/16 1242

## 2016-11-28 NOTE — Telephone Encounter (Signed)
Patient went to [facility] urgent care and was diagnosed with Diverticulitis of colon. Patient was given abx.

## 2016-11-28 NOTE — Telephone Encounter (Signed)
Pt has seen GI previously.  Saw Dr Lutricia Feil.  He did a colonoscopy on her previously.  From the previous message, I thought she was saying does she need to call GI.  Agree with evaluation today and have them send me information.  We can go from there if f/u with GI needed.  Let us know if any problems.

## 2016-11-28 NOTE — Telephone Encounter (Signed)
Pt is currently is having left flank pain and left abdominal. Pt would like to get in to see Dr. Lorin Picket. She also ask if she should contact her gastroenterologist  Pt contact [phone]

## 2016-11-28 NOTE — Telephone Encounter (Signed)
Patient is having left flank pain and lower left abdominal pain. Was running fever and having chills yesterday but is not today. She says pain was like a stabbing pain yesterday and now it is like a constant ache. Patient says she has diverticulosis. She was wondering if there is anything you can do for her or if she should call her GI doctor to be evaluated by them. Please advise.

## 2016-11-28 NOTE — Discharge Instructions (Signed)
Please return here or to the Emergency Department immediately should you feel worse in any way or have any of the following symptoms: increasing or different abdominal pain, persistent vomiting, fevers, or shaking chills. ° ° °

## 2016-11-28 NOTE — Telephone Encounter (Signed)
Let her know that I am not in the office this pm.  I do think she needs to be evaluated.  Do recommend notifying GI and see if they want to see.  Do recommend evaluation today.

## 2016-12-18 ENCOUNTER — Other Ambulatory Visit: Payer: Self-pay | Admitting: Internal Medicine

## 2016-12-18 ENCOUNTER — Encounter: Payer: Self-pay | Admitting: Internal Medicine

## 2016-12-18 ENCOUNTER — Ambulatory Visit (INDEPENDENT_AMBULATORY_CARE_PROVIDER_SITE_OTHER): Payer: Commercial Managed Care - PPO | Admitting: Internal Medicine

## 2016-12-18 DIAGNOSIS — K760 Fatty (change of) liver, not elsewhere classified: Secondary | ICD-10-CM | POA: Diagnosis not present

## 2016-12-18 DIAGNOSIS — K219 Gastro-esophageal reflux disease without esophagitis: Secondary | ICD-10-CM

## 2016-12-18 DIAGNOSIS — E7800 Pure hypercholesterolemia, unspecified: Secondary | ICD-10-CM | POA: Diagnosis not present

## 2016-12-18 DIAGNOSIS — R198 Other specified symptoms and signs involving the digestive system and abdomen: Secondary | ICD-10-CM | POA: Diagnosis not present

## 2016-12-18 DIAGNOSIS — R911 Solitary pulmonary nodule: Secondary | ICD-10-CM | POA: Diagnosis not present

## 2016-12-18 DIAGNOSIS — E039 Hypothyroidism, unspecified: Secondary | ICD-10-CM

## 2016-12-18 DIAGNOSIS — Z23 Encounter for immunization: Secondary | ICD-10-CM | POA: Diagnosis not present

## 2016-12-18 DIAGNOSIS — J4520 Mild intermittent asthma, uncomplicated: Secondary | ICD-10-CM

## 2016-12-18 MED ORDER — ESCITALOPRAM OXALATE 20 MG PO TABS: 20.0000 mg | ORAL_TABLET | Freq: Every day | ORAL | 1 refills | Status: DC

## 2016-12-18 MED ORDER — PRAVASTATIN SODIUM 10 MG PO TABS: ORAL_TABLET | ORAL | 1 refills | Status: DC

## 2016-12-18 NOTE — Progress Notes (Signed)
Patient ID: Bethany Cook, female   DOB: [birth year], 51 y.o.   MRN: [id number]   Subjective:    Patient ID: Bethany Cook, female    DOB: March 12, 1966, 51 y.o.   MRN: [id number]  HPI  Patient here for a scheduled follow up.  She was seen recently at urgent care for abdominal discomfort.  Note reviewed.  Was diagnosed with diverticulitis based on clinical exam.  Placed on cipro and flagyl.  Abdominal pain is better.  She still has intermittent diarrhea.  May flare 3 days per week and will have 6-7 episodes of diarrhea a day.  This has been present for a while - even prior to urgent care visit.  No blood.  Eating.  No vomiting.  Breathing stable.  No chest pain.  No acid reflux.  Previous w/up for chest pain included a CT chest.  Found to have lung nodules.  Discussed f/u with Dr Inez Catalina.  She is in agreement.     Past Medical History:  Diagnosis Date  . Anemia    iron deficient  . Asthma   . Environmental allergies   . Hypercholesterolemia   . Hyperthyroidism    s/p ablation   Past Surgical History:  Procedure Laterality Date  . NO PAST SURGERIES     Family History  Problem Relation Age of Onset  . Colon cancer Father   . Colon cancer Paternal Uncle        x4  . Asthma Unknown        cousine  . Breast cancer Neg Hx    Social History   Social History  . Marital status: Single    Spouse name: N/A  . Number of children: 0  . Years of education: N/A   Social History Main Topics  . Smoking status: Never Smoker  . Smokeless tobacco: Never Used  . Alcohol use 0.0 oz/week     Comment: occasional  . Drug use: No  . Sexual activity: Not Asked   Other Topics Concern  . None   Social History Narrative  . None    Outpatient Encounter Prescriptions as of 12/18/2016  Medication Sig  . albuterol (PROVENTIL HFA;VENTOLIN HFA) 108 (90 Base) MCG/ACT inhaler Inhale 1 puff into the lungs every 6 (six) hours as needed for wheezing or shortness of breath.  . budesonide-formoterol  (SYMBICORT) 160-4.5 MCG/ACT inhaler Inhale 2 puffs into the lungs 2 (two) times daily.  Marland Kitchen levocetirizine (XYZAL) 5 MG tablet Take 5 mg by mouth every evening.  Marland Kitchen levothyroxine (SYNTHROID, LEVOTHROID) 125 MCG tablet Take 125 mcg by mouth daily before breakfast.  . montelukast (SINGULAIR) 10 MG tablet TAKE 1 TABLET BY MOUTH AT  BEDTIME (Patient taking differently: TAKE 10 MG BY MOUTH AT  BEDTIME)  . omeprazole (PRILOSEC) 40 MG capsule TAKE 1 CAPSULE BY MOUTH  DAILY (Patient taking differently: TAKE  BY MOUTH  DAILY)  . pravastatin (PRAVACHOL) 10 MG tablet TAKE 10 MG BY MOUTH  EVERY NIGHT  . [DISCONTINUED] escitalopram (LEXAPRO) 10 MG tablet Take 10 mg by mouth at bedtime.   . [DISCONTINUED] pravastatin (PRAVACHOL) 10 MG tablet TAKE 1 TABLET BY MOUTH  EVERY DAY (Patient taking differently: TAKE 10 MG BY MOUTH  EVERY NIGHT)  . escitalopram (LEXAPRO) 20 MG tablet Take 1 tablet (20 mg total) by mouth daily.  . [DISCONTINUED] ciprofloxacin (CIPRO) 500 MG tablet Take 1 tablet (500 mg total) by mouth 2 (two) times daily.  . [DISCONTINUED] levothyroxine (SYNTHROID, LEVOTHROID) 112 MCG tablet TAKE  1 TABLET BY MOUTH  DAILY  . [DISCONTINUED] metroNIDAZOLE (FLAGYL) 500 MG tablet Take 1 tablet (500 mg total) by mouth 3 (three) times daily.   No facility-administered encounter medications on file as of 12/18/2016.     Review of Systems  Constitutional: Negative for appetite change and unexpected weight change.  HENT: Negative for congestion and sinus pressure.   Respiratory: Negative for cough, chest tightness and shortness of breath.        Breathing stable.   Cardiovascular: Negative for chest pain, palpitations and leg swelling.  Gastrointestinal: Positive for diarrhea. Negative for abdominal pain, nausea and vomiting.  Genitourinary: Negative for difficulty urinating and dysuria.  Musculoskeletal: Negative for joint swelling and myalgias.  Skin: Negative for color change and rash.  Neurological:  Negative for dizziness, light-headedness and headaches.  Psychiatric/Behavioral: Negative for agitation.       Increased stress.  Discussed with her today.  She does not feel needs any further intervention at this time.         Objective:    Physical Exam  Constitutional: She appears well-developed and well-nourished. No distress.  HENT:  Nose: Nose normal.  Mouth/Throat: Oropharynx is clear and moist.  Neck: Neck supple. No thyromegaly present.  Cardiovascular: Normal rate and regular rhythm.   Pulmonary/Chest: Breath sounds normal. No respiratory distress. She has no wheezes.  Abdominal: Soft. Bowel sounds are normal. There is no tenderness.  Musculoskeletal: She exhibits no edema or tenderness.  Lymphadenopathy:    She has no cervical adenopathy.  Skin: No rash noted. No erythema.  Psychiatric: She has a normal mood and affect. Her behavior is normal.    BP 136/84 (BP Location: Left Arm, Patient Position: Sitting, Cuff Size: Normal)   Pulse 78   Temp 98.6 F (37 C) (Oral)   Resp 14   Ht  (1.6 m)   Wt 210 lb 3.2 oz (95.3 kg)   SpO2 97%   BMI 37.24 kg/m  Wt Readings from Last 3 Encounters:  12/18/16 210 lb 3.2 oz (95.3 kg)  10/24/16 210 lb 6.4 oz (95.4 kg)  04/27/16 208 lb (94.3 kg)     Lab Results  Component Value Date   WBC 10.0 09/24/2016   HGB 13.3 09/24/2016   HCT 40.4 09/24/2016   PLT 255 09/24/2016   GLUCOSE 110 (H) 09/24/2016   CHOL 208 (H) 07/07/2015   TRIG 97.0 07/07/2015   HDL 53.00 07/07/2015   LDLDIRECT 157.7 04/21/2013   LDLCALC 135 (H) 07/07/2015   ALT 20 09/24/2016   AST 27 09/24/2016   NA 135 09/24/2016   K 4.8 09/24/2016   CL 100 (L) 09/24/2016   CREATININE 0.89 09/24/2016   BUN 12 09/24/2016   CO2 23 09/24/2016   TSH 0.76 04/27/2016       Assessment & Plan:   Problem List Items Addressed This Visit    Asthma    Breathing stable.        Change in bowel movement    Persistent diarrhea as outlined.  Has been present for  months.  No evidence of infection.  Probiotic daily.  Refer to GI for further evaluation and treatment.        Relevant Orders   Ambulatory referral to Gastroenterology   Fatty liver    Low carb diet and exercise.  Follow liver function tests.        GERD (gastroesophageal reflux disease)    Controled on prilosec.        Hypercholesterolemia  On pravastatin.  Low cholesterol diet and exercise.  Follow lipid panel and liver function tests.        Relevant Medications   pravastatin (PRAVACHOL) 10 MG tablet   Other Relevant Orders   Hepatic function panel   Lipid panel   Basic metabolic panel   Hypothyroidism    On thyroid replacement.  Follow tsh.        Relevant Orders   TSH   Lung nodule    Found on recent CT as outlined.  Discussed with her again today.  Refer to Dr Inez Catalina for evaluation and f/u.        Relevant Orders   Ambulatory referral to Cardiothoracic Surgery    Other Visit Diagnoses    Need for immunization against influenza       Relevant Orders   Flu Vaccine QUAD 36+ mos IM (Completed)       Dale [place], MD

## 2016-12-21 ENCOUNTER — Encounter: Payer: Self-pay | Admitting: Internal Medicine

## 2016-12-21 DIAGNOSIS — R198 Other specified symptoms and signs involving the digestive system and abdomen: Secondary | ICD-10-CM

## 2016-12-21 HISTORY — DX: Other specified symptoms and signs involving the digestive system and abdomen: R19.8

## 2016-12-21 NOTE — Assessment & Plan Note (Signed)
Breathing stable.

## 2016-12-21 NOTE — Assessment & Plan Note (Signed)
On pravastatin.  Low cholesterol diet and exercise.  Follow lipid panel and liver function tests.   

## 2016-12-21 NOTE — Assessment & Plan Note (Signed)
Found on recent CT as outlined.  Discussed with her again today.  Refer to Dr Inez Catalina for evaluation and f/u.

## 2016-12-21 NOTE — Assessment & Plan Note (Signed)
On thyroid replacement.  Follow tsh.  

## 2016-12-21 NOTE — Assessment & Plan Note (Signed)
Persistent diarrhea as outlined.  Has been present for months.  No evidence of infection.  Probiotic daily.  Refer to GI for further evaluation and treatment.

## 2016-12-21 NOTE — Assessment & Plan Note (Signed)
Low carb diet and exercise.  Follow liver function tests.   

## 2016-12-21 NOTE — Assessment & Plan Note (Signed)
Controled on prilosec.

## 2017-01-11 ENCOUNTER — Ambulatory Visit: Payer: Self-pay | Admitting: Cardiothoracic Surgery

## 2017-01-18 ENCOUNTER — Ambulatory Visit: Payer: Self-pay

## 2017-01-18 ENCOUNTER — Other Ambulatory Visit: Payer: Self-pay | Admitting: *Deleted

## 2017-01-18 ENCOUNTER — Encounter: Payer: Self-pay | Admitting: Family Medicine

## 2017-01-18 ENCOUNTER — Ambulatory Visit: Payer: Commercial Managed Care - PPO | Admitting: Family Medicine

## 2017-01-18 ENCOUNTER — Other Ambulatory Visit: Payer: Self-pay | Admitting: Family Medicine

## 2017-01-18 ENCOUNTER — Ambulatory Visit (INDEPENDENT_AMBULATORY_CARE_PROVIDER_SITE_OTHER)
Admission: RE | Admit: 2017-01-18 | Discharge: 2017-01-18 | Disposition: A | Discharge status: Home / Self Care | Payer: Commercial Managed Care - PPO | Source: Ambulatory Visit | Attending: Family Medicine | Admitting: Family Medicine

## 2017-01-18 VITALS — BP 120/70 | HR 79 | Temp 98.0°F | Ht 63.0 in | Wt 209.0 lb

## 2017-01-18 DIAGNOSIS — J989 Respiratory disorder, unspecified: Secondary | ICD-10-CM | POA: Diagnosis not present

## 2017-01-18 DIAGNOSIS — J45901 Unspecified asthma with (acute) exacerbation: Secondary | ICD-10-CM

## 2017-01-18 MED ORDER — PREDNISONE 20 MG PO TABS: 40.0000 mg | ORAL_TABLET | Freq: Every day | ORAL | 0 refills | Status: DC

## 2017-01-18 MED ORDER — BENZONATATE 100 MG PO CAPS: 100.0000 mg | ORAL_CAPSULE | Freq: Three times a day (TID) | ORAL | 0 refills | Status: DC | PRN

## 2017-01-18 MED ORDER — DOXYCYCLINE HYCLATE 100 MG PO TABS: 100.0000 mg | ORAL_TABLET | Freq: Two times a day (BID) | ORAL | 0 refills | Status: DC

## 2017-01-18 MED ORDER — ALBUTEROL SULFATE (2.5 MG/3ML) 0.083% IN NEBU
2.5000 mg | INHALATION_SOLUTION | Freq: Once | RESPIRATORY_TRACT | Status: AC
  Administered 2017-01-18: 2.5 mg via RESPIRATORY_TRACT

## 2017-01-18 NOTE — Progress Notes (Signed)
HPI:  Acute visit for respiratory illness: -started: About 5-6 days ago -symptoms:nasal congestion, sore throat, cough, watery itchy eyes, low-grade fever several days ago has now resolved, feels a little tight, but has not taken any of her asthma medicines -denies:fever, SOB, NVD, tooth pain, wheezing, increased albuterol use or use of her asthma medications, thick sputum -has tried: Nothing -sick contacts/travel/risks: no reported flu, strep or tick exposure -Hx of: Asthma, sees asthma specialist ROS: See pertinent positives and negatives per HPI.  Past Medical History:  Diagnosis Date  . Anemia    iron deficient  . Asthma   . Change in bowel movement 12/21/2016  . Cholelithiasis 10/27/2016  . Dizziness 12/26/2014  . Environmental allergies   . Family history of colon cancer 04/25/2013   Colonoscopy 11/18/14 - diverticulosis (sigmoid - mild).  No colorectal neoplasia.  Recommend f/u colonoscopy in five years.    . Fatty liver 10/27/2016  . GERD (gastroesophageal reflux disease) 04/25/2013  . Health care maintenance 04/23/2014  . Hypercholesterolemia   . Hyperthyroidism    s/p ablation  . Hypothyroidism 01/27/2012  . Lung nodule 10/27/2016    Past Surgical History:  Procedure Laterality Date  . NO PAST SURGERIES      Family History  Problem Relation Age of Onset  . Colon cancer Father   . Colon cancer Paternal Uncle        x4  . Asthma Unknown        cousine  . Breast cancer Neg Hx     Social History   Socioeconomic History  . Marital status: Single    Spouse name: None  . Number of children: 0  . Years of education: None  . Highest education level: None  Social Needs  . Financial resource strain: None  . Food insecurity - worry: None  . Food insecurity - inability: None  . Transportation needs - medical: None  . Transportation needs - non-medical: None  Occupational History  . None  Tobacco Use  . Smoking status: Never Smoker  . Smokeless tobacco: Never  Used  Substance and Sexual Activity  . Alcohol use: Yes    Alcohol/week: 0.0 oz    Comment: occasional  . Drug use: No  . Sexual activity: None  Other Topics Concern  . None  Social History Narrative  . None     Current Outpatient Medications:  .  albuterol (PROVENTIL HFA;VENTOLIN HFA) 108 (90 Base) MCG/ACT inhaler, Inhale 1 puff into the lungs every 6 (six) hours as needed for wheezing or shortness of breath., Disp: 21 Inhaler, Rfl: 3 .  budesonide-formoterol (SYMBICORT) 160-4.5 MCG/ACT inhaler, Inhale 2 puffs into the lungs 2 (two) times daily., Disp: , Rfl:  .  escitalopram (LEXAPRO) 20 MG tablet, Take 1 tablet (20 mg total) by mouth daily., Disp: 90 tablet, Rfl: 1 .  levocetirizine (XYZAL) 5 MG tablet, Take 5 mg by mouth every evening., Disp: , Rfl:  .  levothyroxine (SYNTHROID, LEVOTHROID) 112 MCG tablet, TAKE 1 TABLET BY MOUTH  DAILY, Disp: 90 tablet, Rfl: 0 .  montelukast (SINGULAIR) 10 MG tablet, TAKE 1 TABLET BY MOUTH AT  BEDTIME, Disp: 90 tablet, Rfl: 1 .  omeprazole (PRILOSEC) 40 MG capsule, TAKE 1 CAPSULE BY MOUTH  DAILY, Disp: 90 capsule, Rfl: 3 .  pravastatin (PRAVACHOL) 10 MG tablet, TAKE 10 MG BY MOUTH  EVERY NIGHT, Disp: 90 tablet, Rfl: 1 .  benzonatate (TESSALON PERLES) 100 MG capsule, Take 1 capsule (100 mg total) 3 (three) times  daily as needed by mouth., Disp: 20 capsule, Rfl: 0 .  predniSONE (DELTASONE) 20 MG tablet, Take 2 tablets (40 mg total) daily with breakfast by mouth., Disp: 8 tablet, Rfl: 0  EXAM:  Vitals:   01/18/17 1347  BP: 120/70  Pulse: 79  Temp: 98 F (36.7 C)  SpO2: 95%    Body mass index is 37.02 kg/m.  GENERAL: vitals reviewed and listed above, alert, oriented, appears well hydrated and in no acute distress  HEENT: atraumatic, conjunttiva clear, no obvious abnormalities on inspection of external nose and ears, normal appearance of ear canals and TMs, clear nasal congestion, mild post oropharyngeal erythema with PND, no tonsillar edema  or exudate, no sinus TTP  NECK: no obvious masses on inspection  LUNGS: clear to auscultation bilaterally, questionable decreased lung sounds at bases  CV: HRRR, no peripheral edema  MS: moves all extremities without noticeable abnormality  PSYCH: pleasant and cooperative, no obvious depression or anxiety  ASSESSMENT AND PLAN:  Discussed the following assessment and plan:  Respiratory illness - Plan: DG Chest 2 View  Asthma with acute exacerbation, unspecified asthma severity, unspecified whether persistent  -given HPI and exam findings today, a serious infection or illness is unlikely. We discussed potential etiologies, with VURI with mild asthma exacerbation being most likely, and advised using her inhalers as needed, prednisone if this is not enough, supportive care and monitoring. We discussed treatment side effects, likely course, antibiotic misuse, transmission, and signs of developing a serious illness.  She sounds a little tight on exam, so we will do a breathing treatment here. -He also will get a chest x-ray to exclude lower respiratory illness -of course, we advised to return or notify a doctor immediately if symptoms worsen or persist or new concerns arise.  Discussed urgent care and emergency options over the weekend in case she were to worsen.   Patient Instructions  BEFORE YOU LEAVE: -X-ray sheet -Breathing treatment  Go get the x-ray today.  Use your inhalers if any asthma symptoms.  Use the prednisone if this were to worsen.  I hope you are feeling better soon! Seek care immediately if worsening, new concerns or you are not improving with treatment.   INSTRUCTIONS FOR UPPER RESPIRATORY INFECTION:  -plenty of rest and fluids  -nasal saline wash 2-3 times daily (use prepackaged nasal saline or bottled/distilled water if making your own)   -can use AFRIN nasal spray for drainage and nasal congestion - but do NOT use longer then 3-4 days  -can use tylenol  (in no history of liver disease) or ibuprofen (if no history of kidney disease, bowel bleeding or significant heart disease) as directed for aches and sorethroat  -in the winter time, using a humidifier at night is helpful (please follow cleaning instructions)  -if you are taking a cough medication - use only as directed, may also try a teaspoon of honey to coat the throat and throat lozenges.  I sent some Tessalon to the pharmacy for the cough.  -for sore throat, salt water gargles can help  -follow up if you have fevers, facial pain, tooth pain, difficulty breathing or are worsening or symptoms persist longer then expected  Upper Respiratory Infection, Adult An upper respiratory infection (URI) is also known as the common cold. It is often caused by a type of germ (virus). Colds are easily spread (contagious). You can pass it to others by kissing, coughing, sneezing, or drinking out of the same glass. Usually, you get better in  1 to 3  weeks.  However, the cough can last for even longer. HOME CARE   Only take medicine as told by your doctor. Follow instructions provided above.  Drink enough water and fluids to keep your pee (urine) clear or pale yellow.  Get plenty of rest.  Return to work when your temperature is < 100 for 24 hours or as told by your doctor. You may use a face mask and wash your hands to stop your cold from spreading. GET HELP RIGHT AWAY IF:   After the first few days, you feel you are getting worse.  You have questions about your medicine.  You have chills, shortness of breath, or red spit (mucus).  You have pain in the face for more then 1-2 days, especially when you bend forward.  You have a fever, puffy (swollen) neck, pain when you swallow, or white spots in the back of your throat.  You have a bad headache, ear pain, sinus pain, or chest pain.  You have a high-pitched whistling sound when you breathe in and out (wheezing).  You cough up blood.  You  have sore muscles or a stiff neck. MAKE SURE YOU:   Understand these instructions.  Will watch your condition.  Will get help right away if you are not doing well or get worse. Document Released: 08/15/2007 Document Revised: 05/21/2011 Document Reviewed: 06/03/2013 [facility] Patient Information 2015 [place], [place]. This information is not intended to replace advice given to you by your health care provider. Make sure you discuss any questions you have with your health care provider.      Kriste BasqueKIM, Mei Suits R., DO

## 2017-01-18 NOTE — Addendum Note (Signed)
Addended by: Johnella MoloneyFUNDERBURK, Santosha Jividen A on: 01/18/2017 02:13 PM   Modules accepted: Orders

## 2017-01-18 NOTE — Telephone Encounter (Signed)
Phone call from pt. with c/o having fever, head congestion, and cough over past week.  Reported she went to UC on 11/2, and treated with Amoxicillin, Mucinex, and Tylenol/ Motrin.  Stated she woke up feeling clammy/ cold, which she reports is very unusual for her.  Reported fever was the highest at 100.5 on Wed.  Stated the fever is down to 97.7 now.  Stated the cough is mild with clear phlegm.  C/o nasal congestion and pressure in her sinuses.  Stated she just doesn't feel good and requested to see her MD.  Advised Dr.Scott not available; appt. scheduled with Dr. Selena BattenKim today.  Pt. agreed.          Reason for Disposition . Cough with cold symptoms (e.g., runny nose, postnasal drip, throat clearing)  Answer Assessment - Initial Assessment Questions 1. ONSET: "When did the cough begin?"      5 days ago 2. SEVERITY: "How bad is the cough today?"      Occasional, mild 3. RESPIRATORY DISTRESS: "Describe your breathing."      Denied shortness of breath 4. FEVER: "Do you have a fever?" If so, ask: "What is your temperature, how was it measured, and when did it start?"     Fever 2 days ago of 100.5  5. SPUTUM: "Describe the color of your sputum" (clear, white, yellow, green)     Clear; was yellow 6. HEMOPTYSIS: "Are you coughing up any blood?" If so ask: "How much?" (flecks, streaks, tablespoons, etc.)     no 7. CARDIAC HISTORY: "Do you have any history of heart disease?" (e.g., heart attack, congestive heart failure)      no 8. LUNG HISTORY: "Do you have any history of lung disease?"  (e.g., pulmonary embolus, asthma, emphysema)     asthma 9. PE RISK FACTORS: "Do you have a history of blood clots?" (or: recent major surgery, recent prolonged travel, bedridden )     no 10. OTHER SYMPTOMS: "Do you have any other symptoms?" (e.g., runny nose, wheezing, chest pain)       Perspiring/ clammy; having some congestion in sinus; clear drainage; pressure in sinuses 11. PREGNANCY: "Is there any chance you are  pregnant?" "When was your last menstrual period?"       No;menopausal 12. TRAVEL: "Have you traveled out of the country in the last month?" (e.g., travel history, exposures)       n/a  Protocols used: COUGH - ACUTE PRODUCTIVE-A-AH

## 2017-01-18 NOTE — Patient Instructions (Addendum)
BEFORE YOU LEAVE: -X-ray sheet -Breathing treatment  Go get the x-ray today.  Use your inhalers if any asthma symptoms.  Use the prednisone if this were to worsen.  I hope you are feeling better soon! Seek care immediately if worsening, new concerns or you are not improving with treatment.   INSTRUCTIONS FOR UPPER RESPIRATORY INFECTION:  -plenty of rest and fluids  -nasal saline wash 2-3 times daily (use prepackaged nasal saline or bottled/distilled water if making your own)   -can use AFRIN nasal spray for drainage and nasal congestion - but do NOT use longer then 3-4 days  -can use tylenol (in no history of liver disease) or ibuprofen (if no history of kidney disease, bowel bleeding or significant heart disease) as directed for aches and sorethroat  -in the winter time, using a humidifier at night is helpful (please follow cleaning instructions)  -if you are taking a cough medication - use only as directed, may also try a teaspoon of honey to coat the throat and throat lozenges.  I sent some Tessalon to the pharmacy for the cough.  -for sore throat, salt water gargles can help  -follow up if you have fevers, facial pain, tooth pain, difficulty breathing or are worsening or symptoms persist longer then expected  Upper Respiratory Infection, Adult An upper respiratory infection (URI) is also known as the common cold. It is often caused by a type of germ (virus). Colds are easily spread (contagious). You can pass it to others by kissing, coughing, sneezing, or drinking out of the same glass. Usually, you get better in 1 to 3  weeks.  However, the cough can last for even longer. HOME CARE   Only take medicine as told by your doctor. Follow instructions provided above.  Drink enough water and fluids to keep your pee (urine) clear or pale yellow.  Get plenty of rest.  Return to work when your temperature is < 100 for 24 hours or as told by your doctor. You may use a face mask and  wash your hands to stop your cold from spreading. GET HELP RIGHT AWAY IF:   After the first few days, you feel you are getting worse.  You have questions about your medicine.  You have chills, shortness of breath, or red spit (mucus).  You have pain in the face for more then 1-2 days, especially when you bend forward.  You have a fever, puffy (swollen) neck, pain when you swallow, or white spots in the back of your throat.  You have a bad headache, ear pain, sinus pain, or chest pain.  You have a high-pitched whistling sound when you breathe in and out (wheezing).  You cough up blood.  You have sore muscles or a stiff neck. MAKE SURE YOU:   Understand these instructions.  Will watch your condition.  Will get help right away if you are not doing well or get worse. Document Released: 08/15/2007 Document Revised: 05/21/2011 Document Reviewed: 06/03/2013 [facility] Patient Information 2015 [place], [place]. This information is not intended to replace advice given to you by your health care provider. Make sure you discuss any questions you have with your health care provider.

## 2017-02-13 ENCOUNTER — Encounter: Payer: Self-pay | Admitting: Cardiothoracic Surgery

## 2017-02-13 ENCOUNTER — Telehealth: Payer: Self-pay

## 2017-02-13 ENCOUNTER — Ambulatory Visit: Payer: Commercial Managed Care - PPO | Admitting: Cardiothoracic Surgery

## 2017-02-13 DIAGNOSIS — R918 Other nonspecific abnormal finding of lung field: Secondary | ICD-10-CM | POA: Diagnosis not present

## 2017-02-13 NOTE — Telephone Encounter (Signed)
Call made to patient at this time. I verbalized to her that we have her Chest CT [facility] 12[facility]. I advised patient of facility address and phone number and advised her of her follow up appointment with Dr. Thelma Bargeaks on Monday 12/10 at 8:30 to review imaging results. Patient verbalized understanding.

## 2017-02-13 NOTE — Progress Notes (Signed)
Patient ID: Bethany Cook, female   DOB: December 30, 1965, 51 y.o.   MRN: [id number]  Chief Complaint  Patient presents with  . New Patient (Initial Visit)    New Lung Nodule Referred by Dr. Lorin PicketScott    Referred By Dr. Lorin PicketScott Reason for Referral multiple bilateral pulmonary nodules  HPI Location, Quality, Duration, Severity, Timing, Context, Modifying Factors, Associated Signs and Symptoms.  Bethany RidgesMarsha D Cook is a 51 y.o. female.  Her problems began in the summer when she experienced an acute episode of substernal chest pain.  She presented to the emergency department where a chest CT was performed.  This revealed several pulmonary nodules bilaterally.  They measured about 4 mm to 8 mm in size.  She has no history of malignancy and an additional chest CT in 6 months was recommended.  In the interim she developed an episode of pneumonia about a month ago.  She had some fevers and chills some cough and had a chest x-ray done which revealed a left lung pneumonia.  After appropriate therapy she was sent here to have her chest CT followed.  She is a lifelong non-smoker.  She works as an Airline pilotaccountant.  She does have a family history of lung cancer in a grandfather but he was a heavy smoker.  She has had no prior high risk occupations.  She has no prior history of a malignancy.   Past Medical History:  Diagnosis Date  . Anemia    iron deficient  . Asthma   . Change in bowel movement 12/21/2016  . Cholelithiasis 10/27/2016  . Dizziness 12/26/2014  . Environmental allergies   . Family history of colon cancer 04/25/2013   Colonoscopy 11/18/14 - diverticulosis (sigmoid - mild).  No colorectal neoplasia.  Recommend f/u colonoscopy in five years.    . Fatty liver 10/27/2016  . GERD (gastroesophageal reflux disease) 04/25/2013  . Health care maintenance 04/23/2014  . Hypercholesterolemia   . Hyperthyroidism    s/p ablation  . Hypothyroidism 01/27/2012  . Lung nodule 10/27/2016  . Viral upper respiratory illness  05/30/2014    Past Surgical History:  Procedure Laterality Date  . NO PAST SURGERIES      Family History  Problem Relation Age of Onset  . Colon cancer Father   . Colon cancer Paternal Uncle        x4  . Asthma Unknown        cousine  . Breast cancer Neg Hx     Social History Social History   Tobacco Use  . Smoking status: Never Smoker  . Smokeless tobacco: Never Used  Substance Use Topics  . Alcohol use: Yes    Alcohol/week: 0.0 oz    Comment: occasional  . Drug use: No    Allergies  Allergen Reactions  . Erythromycin Rash    Current Outpatient Medications  Medication Sig Dispense Refill  . albuterol (PROVENTIL HFA;VENTOLIN HFA) 108 (90 Base) MCG/ACT inhaler Inhale 1 puff into the lungs every 6 (six) hours as needed for wheezing or shortness of breath. 21 Inhaler 3  . budesonide-formoterol (SYMBICORT) 160-4.5 MCG/ACT inhaler Inhale 2 puffs into the lungs 2 (two) times daily.    Marland Kitchen. escitalopram (LEXAPRO) 20 MG tablet Take 1 tablet (20 mg total) by mouth daily. 90 tablet 1  . levocetirizine (XYZAL) 5 MG tablet Take 5 mg by mouth every evening.    Marland Kitchen. levothyroxine (SYNTHROID, LEVOTHROID) 112 MCG tablet TAKE 1 TABLET BY MOUTH  DAILY 90 tablet 0  . montelukast (  SINGULAIR) 10 MG tablet TAKE 1 TABLET BY MOUTH AT  BEDTIME 90 tablet 1  . omeprazole (PRILOSEC) 40 MG capsule TAKE 1 CAPSULE BY MOUTH  DAILY 90 capsule 3  . pravastatin (PRAVACHOL) 10 MG tablet TAKE 10 MG BY MOUTH  EVERY NIGHT 90 tablet 1   No current facility-administered medications for this visit.       Review of Systems A complete review of systems was asked and was negative except for the following positive findings occasional wheezing.  Blood pressure 137/82, pulse 80, temperature (!) 97.4 F (36.3 C), temperature source Oral, resp. rate 16, height 5\' 3"  (1.6 m), weight 208 lb (94.3 kg), SpO2 90 %.  Physical Exam CONSTITUTIONAL:  Pleasant, well-developed, well-nourished, and in no acute  distress. EYES: Pupils equal and reactive to light, Sclera non-icteric EARS, NOSE, MOUTH AND THROAT:  The oropharynx was clear.  Dentition is good repair.  Oral mucosa pink and moist. LYMPH NODES:  Lymph nodes in the neck and axillae were normal RESPIRATORY:  Lungs were clear.  Normal respiratory effort without pathologic use of accessory muscles of respiration CARDIOVASCULAR: Heart was regular without murmurs.  There were no carotid bruits. GI: The abdomen was soft, nontender, and nondistended. There were no palpable masses. There was no hepatosplenomegaly. There were normal bowel sounds in all quadrants. GU:  Rectal deferred.   MUSCULOSKELETAL:  Normal muscle strength and tone.  No clubbing or cyanosis.   SKIN:  There were no pathologic skin lesions.  There were no nodules on palpation. NEUROLOGIC:  Sensation is normal.  Cranial nerves are grossly intact. PSYCH:  Oriented to person, place and time.  Mood and affect are normal.  Data Reviewed CT scan and chest x-ray  I have personally reviewed the patient's imaging, laboratory findings and medical records.    Assessment    I have independently reviewed the patient's chest CT.  There are multiple bilateral pulmonary nodules.    Plan    I would like to repeat the patient's chest CT is it has been almost 6 months since the prior.  We can do this without contrast.  I will see her back in 1 week to review the results of that.  I did explain to her I thought that it was unlikely that these multiple bilateral pulmonary nodules would be malignant.  She understood and will come back and see us in 1 week.       Hulda Marinimothy Onyx Edgley, MD 02/13/2017, 9:35 AM

## 2017-02-13 NOTE — Telephone Encounter (Signed)
Sp[facility] with Darl [facility]Susan at [facility] and she was able to schedule the patient for a Chest CT Scan for Friday [place] [facility] with arrival time of 4:15 for a 4:30 appointment. I will call patient and advise.

## 2017-02-13 NOTE — Patient Instructions (Signed)
We will call you once we scheduled you for a CT Scan of your Chest. Please Check-in at 15 minutes prior to your scheduled appointment. If you need to reschedule your Scan, you may do so by calling [phone].  Bring a list of medications with you to your appointment and nothing to eat or drink 4 hours prior to your CT Scan.   We will follow up with you once we get this scheduled.

## 2017-02-15 ENCOUNTER — Ambulatory Visit
Admission: RE | Admit: 2017-02-15 | Discharge: 2017-02-15 | Disposition: A | Discharge status: Home / Self Care | Payer: Commercial Managed Care - PPO | Source: Ambulatory Visit | Attending: Cardiothoracic Surgery | Admitting: Cardiothoracic Surgery

## 2017-02-15 DIAGNOSIS — R918 Other nonspecific abnormal finding of lung field: Secondary | ICD-10-CM | POA: Diagnosis not present

## 2017-02-18 ENCOUNTER — Ambulatory Visit: Payer: Self-pay | Admitting: Cardiothoracic Surgery

## 2017-02-21 ENCOUNTER — Ambulatory Visit (INDEPENDENT_AMBULATORY_CARE_PROVIDER_SITE_OTHER): Payer: Commercial Managed Care - PPO | Admitting: Cardiothoracic Surgery

## 2017-02-21 ENCOUNTER — Encounter: Payer: Self-pay | Admitting: Cardiothoracic Surgery

## 2017-02-21 VITALS — BP 139/81 | HR 79 | Temp 98.0°F | Resp 16 | Ht 63.0 in | Wt 208.0 lb

## 2017-02-21 DIAGNOSIS — R918 Other nonspecific abnormal finding of lung field: Secondary | ICD-10-CM

## 2017-02-21 NOTE — Progress Notes (Signed)
  Patient ID: Bethany Cook, female   DOB: [birth year], 51 y.o.   MRN: [id number]  HISTORY: This patient returns today in follow-up.  She has no new complaints.  She did get a little short of breath with shoveling snow but felt that this was normal.  She has no other complaints today.   Vitals:   02/21/17 0900  BP: 139/81  Pulse: 79  Resp: 16  Temp: 98 F (36.7 C)  SpO2: 98%     EXAM:    Resp: Lungs are clear bilaterally.  No respiratory distress, normal effort. Heart:  Regular without murmurs Abd:  Abdomen is soft, non distended and non tender. No masses are palpable.  There is no rebound and no guarding.  Neurological: Alert and oriented to person, place, and time. Coordination normal.  Skin: Skin is warm and dry. No rash noted. No diaphoretic. No erythema. No pallor.  Psychiatric: Normal mood and affect. Normal behavior. Judgment and thought content normal.    ASSESSMENT: I have independently reviewed the patient's CT scan that she had made.  I compared to the prior scan.  There are no pulmonary nodules present.  The previous nodules have now resolved suggesting a benign etiology.   PLAN:   I explained to the patient that there is no need for any additional follow-up as there is no pulmonary nodule present.  She will continue her follow-up with Dr. Lorin PicketScott.  I did give her a copy of her radiology report for her records.    Hulda Marinimothy Lyniah Fujita, MD

## 2017-03-22 ENCOUNTER — Ambulatory Visit: Payer: Self-pay | Admitting: Internal Medicine

## 2017-04-12 ENCOUNTER — Other Ambulatory Visit: Payer: Self-pay | Admitting: Internal Medicine

## 2017-05-25 ENCOUNTER — Other Ambulatory Visit: Payer: Self-pay | Admitting: Internal Medicine

## 2017-06-07 ENCOUNTER — Ambulatory Visit: Payer: Commercial Managed Care - PPO | Admitting: Internal Medicine

## 2017-06-10 ENCOUNTER — Ambulatory Visit: Payer: Commercial Managed Care - PPO | Admitting: Internal Medicine

## 2017-06-28 ENCOUNTER — Other Ambulatory Visit: Payer: Self-pay | Admitting: Internal Medicine

## 2017-07-03 ENCOUNTER — Other Ambulatory Visit: Payer: Self-pay | Admitting: Internal Medicine

## 2017-07-26 LAB — HM PAP SMEAR

## 2017-07-29 LAB — HM MAMMOGRAPHY

## 2017-09-10 ENCOUNTER — Other Ambulatory Visit: Payer: Self-pay | Admitting: Internal Medicine

## 2017-09-19 ENCOUNTER — Other Ambulatory Visit: Payer: Self-pay | Admitting: Internal Medicine

## 2017-10-31 ENCOUNTER — Other Ambulatory Visit: Payer: Self-pay | Admitting: Internal Medicine

## 2017-11-05 ENCOUNTER — Other Ambulatory Visit: Payer: Self-pay | Admitting: Internal Medicine

## 2017-11-06 ENCOUNTER — Telehealth: Payer: Self-pay | Admitting: Internal Medicine

## 2017-11-06 NOTE — Telephone Encounter (Signed)
Called [facility] regarding refill of Levothyroxine. Spoke with Adela LankJacqueline who states that a 90 day supply of Levothyroxine was mailed to the pt on 09/19/17 and should last the pt to October. Refill not appropriate at this time.

## 2017-11-06 NOTE — Telephone Encounter (Signed)
Copied from CRM [id number]. Topic: Quick Communication - Rx Refill/Question >> Nov 06, 2017  5:42 PM Raquel SarnaHayes, Teresa G wrote: levothyroxine (SYNTHROID, LEVOTHROID) 112 MCG tablet  Pt needing refills  [facility] [place] [id number] [phone] (Phone) [phone] (Fax)

## 2017-11-21 ENCOUNTER — Other Ambulatory Visit: Payer: Self-pay | Admitting: Internal Medicine

## 2017-12-13 ENCOUNTER — Encounter: Payer: Self-pay | Admitting: Internal Medicine

## 2017-12-13 ENCOUNTER — Ambulatory Visit: Payer: Commercial Managed Care - PPO | Admitting: Internal Medicine

## 2017-12-13 DIAGNOSIS — J4520 Mild intermittent asthma, uncomplicated: Secondary | ICD-10-CM

## 2017-12-13 DIAGNOSIS — Z800 Family history of malignant neoplasm of digestive organs: Secondary | ICD-10-CM | POA: Diagnosis not present

## 2017-12-13 DIAGNOSIS — D649 Anemia, unspecified: Secondary | ICD-10-CM | POA: Diagnosis not present

## 2017-12-13 DIAGNOSIS — K760 Fatty (change of) liver, not elsewhere classified: Secondary | ICD-10-CM

## 2017-12-13 DIAGNOSIS — Z23 Encounter for immunization: Secondary | ICD-10-CM | POA: Diagnosis not present

## 2017-12-13 DIAGNOSIS — F439 Reaction to severe stress, unspecified: Secondary | ICD-10-CM

## 2017-12-13 DIAGNOSIS — Z9109 Other allergy status, other than to drugs and biological substances: Secondary | ICD-10-CM | POA: Diagnosis not present

## 2017-12-13 DIAGNOSIS — E039 Hypothyroidism, unspecified: Secondary | ICD-10-CM

## 2017-12-13 DIAGNOSIS — K219 Gastro-esophageal reflux disease without esophagitis: Secondary | ICD-10-CM

## 2017-12-13 DIAGNOSIS — E7800 Pure hypercholesterolemia, unspecified: Secondary | ICD-10-CM

## 2017-12-13 NOTE — Progress Notes (Signed)
Patient ID: Bethany Cook, female   DOB: 01-19-1966, 52 y.o.   MRN: [id number]   Subjective:    Patient ID: Bethany Cook, female    DOB: 06/11/1965, 52 y.o.   MRN: [id number]  HPI  Patient here for a scheduled follow up.  She reports she is doing relatively well.  Increased stress.  Her mother has been sick, etc.  Discussed with her today.  On lexapro.  Does not feel she needs any further intervention.  Has good support.   Breathing has been stable.  Discussed diet and exercise.  No acid reflux.  No abdominal pain.  Bowels moving.  Sees Dr Rana Snare. Up to date with pap, mammogram, etc.  Was evaluated 08/2017.    Past Medical History:  Diagnosis Date  . Anemia    iron deficient  . Asthma   . Change in bowel movement 12/21/2016  . Cholelithiasis 10/27/2016  . Dizziness 12/26/2014  . Environmental allergies   . Family history of colon cancer 04/25/2013   Colonoscopy 11/18/14 - diverticulosis (sigmoid - mild).  No colorectal neoplasia.  Recommend f/u colonoscopy in five years.    . Fatty liver 10/27/2016  . GERD (gastroesophageal reflux disease) 04/25/2013  . Health care maintenance 04/23/2014  . Hypercholesterolemia   . Hyperthyroidism    s/p ablation  . Hypothyroidism 01/27/2012  . Lung nodule 10/27/2016  . Viral upper respiratory illness 05/30/2014   Past Surgical History:  Procedure Laterality Date  . NO PAST SURGERIES     Family History  Problem Relation Age of Onset  . Colon cancer Father   . Colon cancer Paternal Uncle        x4  . Asthma Unknown        cousine  . Breast cancer Neg Hx    Social History   Socioeconomic History  . Marital status: Single    Spouse name: Not on file  . Number of children: 0  . Years of education: Not on file  . Highest education level: Not on file  Occupational History  . Not on file  Social Needs  . Financial resource strain: Not on file  . Food insecurity:    Worry: Not on file    Inability: Not on file  . Transportation needs:   Medical: Not on file    Non-medical: Not on file  Tobacco Use  . Smoking status: Never Smoker  . Smokeless tobacco: Never Used  Substance and Sexual Activity  . Alcohol use: Yes    Alcohol/week: 0.0 standard drinks    Comment: occasional  . Drug use: No  . Sexual activity: Not on file  Lifestyle  . Physical activity:    Days per week: Not on file    Minutes per session: Not on file  . Stress: Not on file  Relationships  . Social connections:    Talks on phone: Not on file    Gets together: Not on file    Attends religious service: Not on file    Active member of club or organization: Not on file    Attends meetings of clubs or organizations: Not on file    Relationship status: Not on file  Other Topics Concern  . Not on file  Social History Narrative  . Not on file    Outpatient Encounter Medications as of 12/13/2017  Medication Sig  . albuterol (PROVENTIL HFA;VENTOLIN HFA) 108 (90 Base) MCG/ACT inhaler Inhale 1 puff into the lungs every 6 (six) hours as needed  for wheezing or shortness of breath.  . budesonide-formoterol (SYMBICORT) 160-4.5 MCG/ACT inhaler Inhale 2 puffs into the lungs 2 (two) times daily.  Marland Kitchen escitalopram (LEXAPRO) 20 MG tablet TAKE 1 TABLET BY MOUTH  DAILY  . levocetirizine (XYZAL) 5 MG tablet Take 5 mg by mouth every evening.  Marland Kitchen levothyroxine (SYNTHROID, LEVOTHROID) 112 MCG tablet TAKE 1 TABLET BY MOUTH  DAILY  . montelukast (SINGULAIR) 10 MG tablet TAKE 1 TABLET BY MOUTH AT  BEDTIME  . omeprazole (PRILOSEC) 40 MG capsule TAKE 1 CAPSULE BY MOUTH  DAILY  . pravastatin (PRAVACHOL) 10 MG tablet TAKE 1 TABLET BY MOUTH  EVERY NIGHT  . pravastatin (PRAVACHOL) 10 MG tablet TAKE 1 TABLET BY MOUTH  EVERY NIGHT   No facility-administered encounter medications on file as of 12/13/2017.     Review of Systems  Constitutional: Negative for appetite change and unexpected weight change.  HENT: Negative for congestion and sinus pressure.   Respiratory: Negative for  cough, chest tightness and shortness of breath.   Cardiovascular: Negative for chest pain, palpitations and leg swelling.  Gastrointestinal: Negative for abdominal pain, diarrhea, nausea and vomiting.  Genitourinary: Negative for difficulty urinating and dysuria.  Musculoskeletal: Negative for joint swelling and myalgias.  Skin: Negative for color change and rash.  Neurological: Negative for dizziness, light-headedness and headaches.  Psychiatric/Behavioral: Negative for agitation and dysphoric mood.       Objective:    Physical Exam  Constitutional: She appears well-developed and well-nourished. No distress.  HENT:  Nose: Nose normal.  Mouth/Throat: Oropharynx is clear and moist.  Neck: Neck supple. No thyromegaly present.  Cardiovascular: Normal rate and regular rhythm.  Pulmonary/Chest: Breath sounds normal. No respiratory distress. She has no wheezes.  Abdominal: Soft. Bowel sounds are normal. There is no tenderness.  Musculoskeletal: She exhibits no edema or tenderness.  Lymphadenopathy:    She has no cervical adenopathy.  Skin: No rash noted. No erythema.  Psychiatric: She has a normal mood and affect. Her behavior is normal.    BP 130/80   Pulse 76   Temp 97.7 F (36.5 C) (Oral)   Resp 17   Ht 5\' 3"  (1.6 m)   Wt 207 lb 2 oz (94 kg)   SpO2 99%   BMI 36.69 kg/m  Wt Readings from Last 3 Encounters:  12/13/17 207 lb 2 oz (94 kg)  02/21/17 208 lb (94.3 kg)  02/13/17 208 lb (94.3 kg)     Lab Results  Component Value Date   WBC 10.0 09/24/2016   HGB 13.3 09/24/2016   HCT 40.4 09/24/2016   PLT 255 09/24/2016   GLUCOSE 110 (H) 09/24/2016   CHOL 208 (H) 07/07/2015   TRIG 97.0 07/07/2015   HDL 53.00 07/07/2015   LDLDIRECT 157.7 04/21/2013   LDLCALC 135 (H) 07/07/2015   ALT 20 09/24/2016   AST 27 09/24/2016   NA 135 09/24/2016   K 4.8 09/24/2016   CL 100 (L) 09/24/2016   CREATININE 0.89 09/24/2016   BUN 12 09/24/2016   CO2 23 09/24/2016   TSH 0.76  04/27/2016    Ct Chest Wo Contrast  Result Date: 02/16/2017 CLINICAL DATA:  Followup pulmonary nodules. EXAM: CT CHEST WITHOUT CONTRAST TECHNIQUE: Multidetector CT imaging of the chest was performed following the standard protocol without IV contrast. COMPARISON:  CT 09/24/2016 FINDINGS: Cardiovascular: No significant vascular findings. Normal heart size. No pericardial effusion. Mediastinum/Nodes: No axillary or supraclavicular adenopathy. No mediastinal or hilar adenopathy. No pericardial fluid. Lungs/Pleura: The RIGHT upper lobe  pulmonary nodule identified on comparison CT exam has resolved. Likewise the RIGHT middle lobe nodules resolved. No new pulmonary nodules. Airways normal. Upper Abdomen: Limited view of the liver, kidneys, pancreas are unremarkable. Normal adrenal glands. Musculoskeletal: No aggressive osseous lesion. IMPRESSION: 1. Resolution of RIGHT upper lobe pulmonary nodules consistent with benign etiology. 2. No pulmonary parenchymal abnormality. Electronically Signed   By: Genevive Bi M.D.   On: 02/16/2017 10:11       Assessment & Plan:   Problem List Items Addressed This Visit    Anemia    Follow cbc.       Relevant Orders   CBC with Differential/Platelet   Asthma    Breathing stable.       Environmental allergies    Controlled on current regimen.  Sees an allergist.        Family history of colon cancer    Colonoscopy 11/2014.  Recommended f/u in 5 years.        Fatty liver    Low carb diet and exercise.  Weight loss.  Follow liver panel.       Relevant Orders   Hepatic function panel   GERD (gastroesophageal reflux disease)    Controlled on current regimen.        Hypercholesterolemia    On pravastatin.  Low cholesterol diet and exercise.  Follow lipid panel and liver function tests.        Relevant Orders   Lipid panel   Basic metabolic panel   Hypothyroidism    On thyroid replacement.  Follow tsh.       Relevant Orders   TSH   Stress     Discussed with her today.  On lexapro.  Does not feel needs any further intervention.  Follow.         Other Visit Diagnoses    Need for immunization against influenza       Relevant Orders   Flu Vaccine QUAD 36+ mos IM (Completed)       Dale Baden, MD

## 2017-12-17 ENCOUNTER — Encounter: Payer: Self-pay | Admitting: Internal Medicine

## 2017-12-17 DIAGNOSIS — F439 Reaction to severe stress, unspecified: Secondary | ICD-10-CM | POA: Insufficient documentation

## 2017-12-17 NOTE — Assessment & Plan Note (Signed)
Breathing stable.

## 2017-12-17 NOTE — Assessment & Plan Note (Signed)
Controlled on current regimen.   

## 2017-12-17 NOTE — Assessment & Plan Note (Signed)
On thyroid replacement.  Follow tsh.  

## 2017-12-17 NOTE — Assessment & Plan Note (Signed)
On pravastatin.  Low cholesterol diet and exercise.  Follow lipid panel and liver function tests.   

## 2017-12-17 NOTE — Assessment & Plan Note (Signed)
Discussed with her today.  On lexapro.  Does not feel needs any further intervention.  Follow.   

## 2017-12-17 NOTE — Assessment & Plan Note (Signed)
Controlled on current regimen.  Sees an allergist.

## 2017-12-17 NOTE — Assessment & Plan Note (Signed)
Colonoscopy 11/2014.  Recommended f/u in 5 years.  

## 2017-12-17 NOTE — Assessment & Plan Note (Signed)
Follow cbc.  

## 2017-12-17 NOTE — Assessment & Plan Note (Signed)
Low carb diet and exercise.  Weight loss.  Follow liver panel.

## 2018-01-21 ENCOUNTER — Other Ambulatory Visit: Payer: Self-pay | Admitting: Internal Medicine

## 2018-01-30 ENCOUNTER — Other Ambulatory Visit (INDEPENDENT_AMBULATORY_CARE_PROVIDER_SITE_OTHER): Payer: Commercial Managed Care - PPO

## 2018-01-30 DIAGNOSIS — E039 Hypothyroidism, unspecified: Secondary | ICD-10-CM

## 2018-01-30 DIAGNOSIS — D649 Anemia, unspecified: Secondary | ICD-10-CM

## 2018-01-30 DIAGNOSIS — E7800 Pure hypercholesterolemia, unspecified: Secondary | ICD-10-CM

## 2018-01-30 DIAGNOSIS — K760 Fatty (change of) liver, not elsewhere classified: Secondary | ICD-10-CM | POA: Diagnosis not present

## 2018-01-30 LAB — CBC WITH DIFFERENTIAL/PLATELET
Basophils Absolute: 0.1 10*3/uL (ref 0.0–0.1)
Basophils Relative: 0.9 % (ref 0.0–3.0)
Eosinophils Absolute: 0.2 10*3/uL (ref 0.0–0.7)
Eosinophils Relative: 2.4 % (ref 0.0–5.0)
HEMATOCRIT: 39.5 % (ref 36.0–46.0)
HEMOGLOBIN: 13.3 g/dL (ref 12.0–15.0)
LYMPHS PCT: 25 % (ref 12.0–46.0)
Lymphs Abs: 2.3 10*3/uL (ref 0.7–4.0)
MCHC: 33.7 g/dL (ref 30.0–36.0)
MCV: 84.6 fl (ref 78.0–100.0)
MONO ABS: 0.7 10*3/uL (ref 0.1–1.0)
Monocytes Relative: 7.6 % (ref 3.0–12.0)
Neutro Abs: 5.8 10*3/uL (ref 1.4–7.7)
Neutrophils Relative %: 64.1 % (ref 43.0–77.0)
Platelets: 301 10*3/uL (ref 150.0–400.0)
RBC: 4.67 Mil/uL (ref 3.87–5.11)
RDW: 14.2 % (ref 11.5–15.5)
WBC: 9.1 10*3/uL (ref 4.0–10.5)

## 2018-01-30 LAB — TSH: TSH: 2.52 u[IU]/mL (ref 0.35–4.50)

## 2018-01-30 LAB — BASIC METABOLIC PANEL
BUN: 10 mg/dL (ref 6–23)
CHLORIDE: 102 meq/L (ref 96–112)
CO2: 29 meq/L (ref 19–32)
CREATININE: 0.88 mg/dL (ref 0.40–1.20)
Calcium: 9.5 mg/dL (ref 8.4–10.5)
GFR: 71.04 mL/min (ref >60.00)
Glucose, Bld: 102 mg/dL — ABNORMAL HIGH (ref 70–99)
POTASSIUM: 4.1 meq/L (ref 3.5–5.1)
Sodium: 141 mEq/L (ref 135–145)

## 2018-01-30 LAB — LIPID PANEL
CHOLESTEROL: 197 mg/dL (ref 0–200)
HDL: 48.5 mg/dL (ref >39.00)
LDL Cholesterol: 112 mg/dL — ABNORMAL HIGH (ref 0–99)
NONHDL: 148.26
Total CHOL/HDL Ratio: 4
Triglycerides: 181 mg/dL — ABNORMAL HIGH (ref 0.0–149.0)
VLDL: 36.2 mg/dL (ref 0.0–40.0)

## 2018-01-30 LAB — HEPATIC FUNCTION PANEL
ALBUMIN: 4.3 g/dL (ref 3.5–5.2)
ALT: 15 U/L (ref 0–35)
AST: 14 U/L (ref 0–37)
Alkaline Phosphatase: 86 U/L (ref 39–117)
Bilirubin, Direct: 0 mg/dL (ref 0.0–0.3)
Total Bilirubin: 0.3 mg/dL (ref 0.2–1.2)
Total Protein: 7.4 g/dL (ref 6.0–8.3)

## 2018-02-12 ENCOUNTER — Encounter (HOSPITAL_COMMUNITY): Payer: Self-pay

## 2018-02-12 ENCOUNTER — Ambulatory Visit (HOSPITAL_COMMUNITY)
Admission: EM | Admit: 2018-02-12 | Discharge: 2018-02-12 | Disposition: A | Discharge status: Home / Self Care | Payer: Commercial Managed Care - PPO | Attending: Emergency Medicine | Admitting: Emergency Medicine

## 2018-02-12 DIAGNOSIS — M79674 Pain in right toe(s): Secondary | ICD-10-CM | POA: Diagnosis not present

## 2018-02-12 DIAGNOSIS — S90211A Contusion of right great toe with damage to nail, initial encounter: Secondary | ICD-10-CM

## 2018-02-12 DIAGNOSIS — W208XXA Other cause of strike by thrown, projected or falling object, initial encounter: Secondary | ICD-10-CM | POA: Diagnosis not present

## 2018-02-12 MED ORDER — CEPHALEXIN 500 MG PO CAPS: 500.0000 mg | ORAL_CAPSULE | Freq: Four times a day (QID) | ORAL | 0 refills | Status: AC

## 2018-02-12 NOTE — ED Triage Notes (Signed)
Pt presents with right big toe injury from dropping a full juice jug on it a few days ago.

## 2018-02-12 NOTE — Discharge Instructions (Signed)
Use anti-inflammatories for pain/swelling. You may take up to 800 mg Ibuprofen every 8 hours with food. You may supplement Ibuprofen with Tylenol [phone] mg every 8 hours. Or 2 aleeve  Ice  May pick up keflex if redness, warmth and swelling worsening

## 2018-02-12 NOTE — ED Provider Notes (Signed)
[facility]    CSN: [id number] Arrival date & time: 02/12/18  0759     History   Chief Complaint Chief Complaint  Patient presents with  . Toe Injury    Right Big Toe    HPI DATRA CLARY is a 52 y.o. female history of GERD, hypothyroidism, presenting today for evaluation of toe injury.  Patient dropped a full gallon of juice onto her right great toe.  Since she has had discoloration and pain.  States that she is able to wiggle toe, but most of her pain is around the nailbed.  She has taken some ibuprofen with mild relief.  She denies concern for fracture.  Incident happened on Sunday, 3 days ago.  HPI  Past Medical History:  Diagnosis Date  . Anemia    iron deficient  . Asthma   . Change in bowel movement 12/21/2016  . Cholelithiasis 10/27/2016  . Dizziness 12/26/2014  . Environmental allergies   . Family history of colon cancer 04/25/2013   Colonoscopy 11/18/14 - diverticulosis (sigmoid - mild).  No colorectal neoplasia.  Recommend f/u colonoscopy in five years.    . Fatty liver 10/27/2016  . GERD (gastroesophageal reflux disease) 04/25/2013  . Health care maintenance 04/23/2014  . Hypercholesterolemia   . Hyperthyroidism    s/p ablation  . Hypothyroidism 01/27/2012  . Lung nodule 10/27/2016  . Viral upper respiratory illness 05/30/2014    Patient Active Problem List   Diagnosis Date Noted  . Stress 12/17/2017  . Change in bowel movement 12/21/2016  . Cholelithiasis 10/27/2016  . Fatty liver 10/27/2016  . Lung nodule 10/27/2016  . Abdominal pain 04/27/2016  . Dizziness 12/26/2014  . Soft tissue swelling 12/26/2014  . Environmental allergies 04/23/2014  . Health care maintenance 04/23/2014  . Nasal drainage 04/01/2014  . GERD (gastroesophageal reflux disease) 04/25/2013  . Family history of colon cancer 04/25/2013  . Asthma 04/01/2012  . Anemia 04/01/2012  . Hypercholesterolemia 04/01/2012  . Hypothyroidism 01/27/2012    Past Surgical History:    Procedure Laterality Date  . NO PAST SURGERIES      OB History   None      Home Medications    Prior to Admission medications   Medication Sig Start Date End Date Taking? Authorizing Provider  albuterol (PROVENTIL HFA;VENTOLIN HFA) 108 (90 Base) MCG/ACT inhaler Inhale 1 puff into the lungs every 6 (six) hours as needed for wheezing or shortness of breath. 04/27/16   Dale [place], MD  budesonide-formoterol [facility]) 160-4.5 MCG/ACT inhaler Inhale 2 puffs into the lungs 2 (two) times daily.    [provider]  cephALEXin (KEFLEX) 500 MG capsule Take 1 capsule (500 mg total) by mouth 4 (four) times daily for 5 days. 02/12/18 02/17/18  Lorrena Goranson C, PA-C  escitalopram (LEXAPRO) 20 MG tablet TAKE 1 TABLET BY MOUTH  DAILY 11/01/17   Dale [place], MD  levocetirizine (XYZAL) 5 MG tablet Take 5 mg by mouth every evening.    [provider]  levothyroxine (SYNTHROID, LEVOTHROID) 112 MCG tablet TAKE 1 TABLET BY MOUTH  DAILY 01/22/18   Dale [place], MD  montelukast (SINGULAIR) 10 MG tablet TAKE 1 TABLET BY MOUTH AT  BEDTIME 09/20/17   Dale [place], MD  omeprazole (PRILOSEC) 40 MG capsule TAKE 1 CAPSULE BY MOUTH  DAILY 11/27/17   Dale [place], MD  pravastatin (PRAVACHOL) 10 MG tablet TAKE 1 TABLET BY MOUTH  EVERY NIGHT 07/04/17   Dale [place], MD  pravastatin (PRAVACHOL) 10 MG tablet  TAKE 1 TABLET BY MOUTH  EVERY NIGHT 11/27/17   Dale [place], Charlene, MD    Family History Family History  Problem Relation Age of Onset  . Colon cancer Father   . Colon cancer Paternal Uncle        x4  . Asthma Unknown        cousine  . Breast cancer Neg Hx     Social History Social History   Tobacco Use  . Smoking status: Never Smoker  . Smokeless tobacco: Never Used  Substance Use Topics  . Alcohol use: Yes    Alcohol/week: 0.0 standard drinks    Comment: occasional  . Drug use: No     Allergies   Erythromycin   Review of Systems Review of Systems   Constitutional: Negative for fatigue and fever.  Eyes: Negative for visual disturbance.  Respiratory: Negative for shortness of breath.   Cardiovascular: Negative for chest pain.  Gastrointestinal: Negative for abdominal pain, nausea and vomiting.  Musculoskeletal: Positive for joint swelling. Negative for arthralgias and gait problem.  Skin: Positive for color change. Negative for rash and wound.  Neurological: Negative for dizziness, weakness, light-headedness and headaches.     Physical Exam Triage Vital Signs ED Triage Vitals  Enc Vitals Group     BP 02/12/18 0820 139/65     Pulse Rate 02/12/18 0820 66     Resp 02/12/18 0820 20     Temp 02/12/18 0820 98 F (36.7 C)     Temp Source 02/12/18 0820 Oral     SpO2 02/12/18 0820 96 %     Weight --      Height --      Head Circumference --      Peak Flow --      Pain Score 02/12/18 0821 5     Pain Loc --      Pain Edu? --      Excl. in GC? --    No data found.  Updated Vital Signs BP 139/65 (BP Location: Right Arm)   Pulse 66   Temp 98 F (36.7 C) (Oral)   Resp 20   SpO2 96%   Visual Acuity Right Eye Distance:   Left Eye Distance:   Bilateral Distance:    Right Eye Near:   Left Eye Near:    Bilateral Near:     Physical Exam  Constitutional: She is oriented to person, place, and time. She appears well-developed and well-nourished.  No acute distress  HENT:  Head: Normocephalic and atraumatic.  Nose: Nose normal.  Eyes: Conjunctivae are normal.  Neck: Neck supple.  Cardiovascular: Normal rate.  Pulmonary/Chest: Effort normal. No respiratory distress.  Abdominal: She exhibits no distension.  Musculoskeletal: Normal range of motion.  Nontender over bony aspects of toes throughout dorsum of foot/metatarsals  Neurological: She is alert and oriented to person, place, and time.  Skin: Skin is warm and dry.  Right great nail with bluish blackish discoloration underneath nailbed, slight bruising to proximal  nailbed with surrounding erythema, erythema does not extend to lateral nail folds   Psychiatric: She has a normal mood and affect.  Nursing note and vitals reviewed.    UC Treatments / Results  Labs (all labs ordered are listed, but only abnormal results are displayed) Labs Reviewed - No data to display  EKG None  Radiology No results found.  Procedures Procedures (including critical care time) Cautery pen used to slowly make hole in the nail, hole placed centrally over area of  subungual hematoma, blood expressed; patient tolerated well, no immediate complications   Medications Ordered in UC Medications - No data to display  Initial Impression / Assessment and Plan / UC Course  I have reviewed the triage vital signs and the nursing notes.  Pertinent labs & imaging results that were available during my care of the patient were reviewed by me and considered in my medical decision making (see chart for details).     Subungual hematoma drained, redness around base of toe most likely bruisings/local inflammation versus infection.  Recommended ice and anti-inflammatories.  Did provide prescription for Keflex to fill if redness and swelling worsening.  Continue to monitor symptoms,Discussed strict return precautions. Patient verbalized understanding and is agreeable with plan.  Final Clinical Impressions(s) / UC Diagnoses   Final diagnoses:  Subungual hematoma of great toe of right foot, initial encounter     Discharge Instructions     Use anti-inflammatories for pain/swelling. You may take up to 800 mg Ibuprofen every 8 hours with food. You may supplement Ibuprofen with Tylenol [phone] mg every 8 hours. Or 2 aleeve  Ice  May pick up keflex if redness, warmth and swelling worsening   ED Prescriptions    Medication Sig Dispense Auth. Provider   cephALEXin (KEFLEX) 500 MG capsule Take 1 capsule (500 mg total) by mouth 4 (four) times daily for 5 days. 20 capsule Alese Furniss,  Aris Even C, PA-C     Controlled Substance Prescriptions [place] Controlled Substance Registry consulted? Not Applicable   Lew Dawes, [place] 02/12/18 1048

## 2018-02-14 ENCOUNTER — Other Ambulatory Visit: Payer: Self-pay | Admitting: Internal Medicine

## 2018-04-03 ENCOUNTER — Ambulatory Visit: Payer: Commercial Managed Care - PPO | Admitting: Internal Medicine

## 2018-04-03 ENCOUNTER — Encounter: Payer: Self-pay | Admitting: Internal Medicine

## 2018-04-03 DIAGNOSIS — E039 Hypothyroidism, unspecified: Secondary | ICD-10-CM

## 2018-04-03 DIAGNOSIS — J4520 Mild intermittent asthma, uncomplicated: Secondary | ICD-10-CM

## 2018-04-03 DIAGNOSIS — Z800 Family history of malignant neoplasm of digestive organs: Secondary | ICD-10-CM | POA: Diagnosis not present

## 2018-04-03 DIAGNOSIS — D649 Anemia, unspecified: Secondary | ICD-10-CM

## 2018-04-03 DIAGNOSIS — E7800 Pure hypercholesterolemia, unspecified: Secondary | ICD-10-CM

## 2018-04-03 DIAGNOSIS — Z9109 Other allergy status, other than to drugs and biological substances: Secondary | ICD-10-CM | POA: Diagnosis not present

## 2018-04-03 DIAGNOSIS — K219 Gastro-esophageal reflux disease without esophagitis: Secondary | ICD-10-CM

## 2018-04-03 DIAGNOSIS — R0681 Apnea, not elsewhere classified: Secondary | ICD-10-CM

## 2018-04-03 DIAGNOSIS — K760 Fatty (change of) liver, not elsewhere classified: Secondary | ICD-10-CM

## 2018-04-03 DIAGNOSIS — F439 Reaction to severe stress, unspecified: Secondary | ICD-10-CM

## 2018-04-03 NOTE — Progress Notes (Signed)
Patient ID: STAMATIA WISSE, female   DOB: 02-15-1966, 53 y.o.   MRN: [id number]   Subjective:    Patient ID: [name], female    DOB: 25-Dec-1965, 53 y.o.   MRN: [id number]  HPI  Patient here for a scheduled follow up.  Gets her gyn care at [facility].  Had pap 07/26/17 - ok.  Mammogram 07/29/17 - ok.  States she is doing relatively well.  Tries to stay active.  No chest pain.  Breathing stable.  No recent flares.  Seeing an allergist.  No acid reflux.  No abdominal pain. Some loose stool at times.  Discussed probiotics.  No blood.  Does report increased snoring.  Has witnessed apneic episodes.  Discussed sleep apnea and further w/up.  She is agreeable to referral to pulmonary.     Past Medical History:  Diagnosis Date  . Anemia    iron deficient  . Asthma   . Change in bowel movement 12/21/2016  . Cholelithiasis 10/27/2016  . Dizziness 12/26/2014  . Environmental allergies   . Family history of colon cancer 04/25/2013   Colonoscopy 11/18/14 - diverticulosis (sigmoid - mild).  No colorectal neoplasia.  Recommend f/u colonoscopy in five years.    . Fatty liver 10/27/2016  . GERD (gastroesophageal reflux disease) 04/25/2013  . Health care maintenance 04/23/2014  . Hypercholesterolemia   . Hyperthyroidism    s/p ablation  . Hypothyroidism 01/27/2012  . Lung nodule 10/27/2016  . Viral upper respiratory illness 05/30/2014   Past Surgical History:  Procedure Laterality Date  . NO PAST SURGERIES     Family History  Problem Relation Age of Onset  . Colon cancer Father   . Colon cancer Paternal Uncle        x4  . Asthma Other        cousine  . Breast cancer Neg Hx    Social History   Socioeconomic History  . Marital status: Single    Spouse name: Not on file  . Number of children: 0  . Years of education: Not on file  . Highest education level: Not on file  Occupational History  . Not on file  Social Needs  . Financial resource strain: Not on file  . Food insecurity:      Worry: Not on file    Inability: Not on file  . Transportation needs:    Medical: Not on file    Non-medical: Not on file  Tobacco Use  . Smoking status: Never Smoker  . Smokeless tobacco: Never Used  Substance and Sexual Activity  . Alcohol use: Yes    Alcohol/week: 0.0 standard drinks    Comment: occasional  . Drug use: No  . Sexual activity: Not on file  Lifestyle  . Physical activity:    Days per week: Not on file    Minutes per session: Not on file  . Stress: Not on file  Relationships  . Social connections:    Talks on phone: Not on file    Gets together: Not on file    Attends religious service: Not on file    Active member of club or organization: Not on file    Attends meetings of clubs or organizations: Not on file    Relationship status: Not on file  Other Topics Concern  . Not on file  Social History Narrative  . Not on file    Outpatient Encounter Medications as of 04/03/2018  Medication Sig  . albuterol (PROVENTIL  HFA;VENTOLIN HFA) 108 (90 Base) MCG/ACT inhaler Inhale 1 puff into the lungs every 6 (six) hours as needed for wheezing or shortness of breath.  . budesonide-formoterol (SYMBICORT) 160-4.5 MCG/ACT inhaler Inhale 2 puffs into the lungs 2 (two) times daily.  Marland Kitchen. escitalopram (LEXAPRO) 20 MG tablet TAKE 1 TABLET BY MOUTH  DAILY  . levocetirizine (XYZAL) 5 MG tablet Take 5 mg by mouth every evening.  Marland Kitchen. levothyroxine (SYNTHROID, LEVOTHROID) 112 MCG tablet TAKE 1 TABLET BY MOUTH  DAILY  . montelukast (SINGULAIR) 10 MG tablet TAKE 1 TABLET BY MOUTH AT  BEDTIME  . omeprazole (PRILOSEC) 40 MG capsule TAKE 1 CAPSULE BY MOUTH  DAILY  . pravastatin (PRAVACHOL) 10 MG tablet TAKE 1 TABLET BY MOUTH  EVERY NIGHT  . zolpidem (AMBIEN) 10 MG tablet Take 1 tablet by mouth at bedtime as needed.  . [DISCONTINUED] pravastatin (PRAVACHOL) 10 MG tablet TAKE 1 TABLET BY MOUTH  EVERY NIGHT  . [DISCONTINUED] pravastatin (PRAVACHOL) 10 MG tablet TAKE 1 TABLET BY MOUTH  EVERY  NIGHT   No facility-administered encounter medications on file as of 04/03/2018.     Review of Systems  Constitutional: Negative for appetite change and unexpected weight change.  HENT: Negative for congestion and sinus pressure.   Respiratory: Negative for cough, chest tightness and shortness of breath.   Cardiovascular: Negative for chest pain, palpitations and leg swelling.  Gastrointestinal: Negative for abdominal pain, diarrhea, nausea and vomiting.  Genitourinary: Negative for difficulty urinating and dysuria.  Musculoskeletal: Negative for joint swelling and myalgias.  Skin: Negative for color change and rash.  Neurological: Negative for dizziness, light-headedness and headaches.  Psychiatric/Behavioral: Negative for agitation and dysphoric mood.       Objective:    Physical Exam Constitutional:      General: She is not in acute distress.    Appearance: Normal appearance.  HENT:     Nose: Nose normal. No congestion.     Mouth/Throat:     Pharynx: No oropharyngeal exudate or posterior oropharyngeal erythema.  Neck:     Musculoskeletal: Neck supple. No muscular tenderness.     Thyroid: No thyromegaly.  Cardiovascular:     Rate and Rhythm: Normal rate and regular rhythm.  Pulmonary:     Effort: No respiratory distress.     Breath sounds: Normal breath sounds. No wheezing.  Abdominal:     General: Bowel sounds are normal.     Palpations: Abdomen is soft.     Tenderness: There is no abdominal tenderness.  Musculoskeletal:        General: No swelling or tenderness.  Lymphadenopathy:     Cervical: No cervical adenopathy.  Skin:    Findings: No erythema or rash.  Neurological:     Mental Status: She is alert.  Psychiatric:        Mood and Affect: Mood normal.        Behavior: Behavior normal.     BP 136/78 (BP Location: Left Arm, Patient Position: Sitting, Cuff Size: Normal)   Pulse 72   Temp 98.1 F (36.7 C) (Oral)   Resp 16   Wt 212 lb 6.4 oz (96.3 kg)    SpO2 97%   BMI 37.62 kg/m  Wt Readings from Last 3 Encounters:  04/03/18 212 lb 6.4 oz (96.3 kg)  12/13/17 207 lb 2 oz (94 kg)  02/21/17 208 lb (94.3 kg)     Lab Results  Component Value Date   WBC 9.1 01/30/2018   HGB 13.3 01/30/2018  HCT 39.5 01/30/2018   PLT 301.0 01/30/2018   GLUCOSE 102 (H) 01/30/2018   CHOL 197 01/30/2018   TRIG 181.0 (H) 01/30/2018   HDL 48.50 01/30/2018   LDLDIRECT 157.7 04/21/2013   LDLCALC 112 (H) 01/30/2018   ALT 15 01/30/2018   AST 14 01/30/2018   NA 141 01/30/2018   K 4.1 01/30/2018   CL 102 01/30/2018   CREATININE 0.88 01/30/2018   BUN 10 01/30/2018   CO2 29 01/30/2018   TSH 2.52 01/30/2018       Assessment & Plan:   Problem List Items Addressed This Visit    Anemia    Follow cbc.       Asthma    Breathing stable.  Overall doing better.        Environmental allergies    Controlled on current regimen.  Seeing an allergist.        Family history of colon cancer    Colonoscopy 11/2014.  Recommended f/u in 5 years.       Fatty liver    Low carb diet and exercise.  Follow liver function tests.        GERD (gastroesophageal reflux disease)    Controlled on current regimen.        Hypercholesterolemia    On pravastatin.  Low cholesterol diet and exercise.  Follow lipid panel and liver function tests.        Relevant Orders   Hepatic function panel   Lipid panel   Basic metabolic panel   Hypothyroidism    On thyroid replacement.  Follow tsh.       Stress    On lexapro.  Overall she feels she is handling things relatively well.  Desires no further intervention.  Follow.        Witnessed episode of apnea    With snoring and witnessed apneic episodes, will refer to pulmonary for further evaluation and w/up for possible sleep apnea.        Relevant Orders   Ambulatory referral to Pulmonology       Dale , MD

## 2018-04-06 ENCOUNTER — Encounter: Payer: Self-pay | Admitting: Internal Medicine

## 2018-04-06 DIAGNOSIS — R0681 Apnea, not elsewhere classified: Secondary | ICD-10-CM | POA: Insufficient documentation

## 2018-04-06 NOTE — Assessment & Plan Note (Signed)
On thyroid replacement.  Follow tsh.  

## 2018-04-06 NOTE — Assessment & Plan Note (Signed)
On pravastatin.  Low cholesterol diet and exercise.  Follow lipid panel and liver function tests.   

## 2018-04-06 NOTE — Assessment & Plan Note (Signed)
Low carb diet and exercise.  Follow liver function tests.   

## 2018-04-06 NOTE — Assessment & Plan Note (Signed)
Controlled on current regimen.   

## 2018-04-06 NOTE — Assessment & Plan Note (Signed)
Controlled on current regimen.  Seeing an allergist.

## 2018-04-06 NOTE — Assessment & Plan Note (Signed)
Colonoscopy 11/2014.  Recommended f/u in 5 years.

## 2018-04-06 NOTE — Assessment & Plan Note (Signed)
Follow cbc.  

## 2018-04-06 NOTE — Assessment & Plan Note (Signed)
On lexapro.  Overall she feels she is handling things relatively well.  Desires no further intervention.  Follow.

## 2018-04-06 NOTE — Assessment & Plan Note (Signed)
With snoring and witnessed apneic episodes, will refer to pulmonary for further evaluation and w/up for possible sleep apnea.

## 2018-04-06 NOTE — Assessment & Plan Note (Signed)
Breathing stable.  Overall doing better.

## 2018-04-16 ENCOUNTER — Encounter: Payer: Self-pay | Admitting: Pulmonary Disease

## 2018-04-16 ENCOUNTER — Other Ambulatory Visit: Payer: Self-pay | Admitting: Internal Medicine

## 2018-04-16 ENCOUNTER — Ambulatory Visit: Payer: Commercial Managed Care - PPO | Admitting: Pulmonary Disease

## 2018-04-16 VITALS — BP 118/80 | HR 78 | Resp 16 | Ht 63.0 in | Wt 214.0 lb

## 2018-04-16 DIAGNOSIS — R0683 Snoring: Secondary | ICD-10-CM

## 2018-04-16 DIAGNOSIS — J309 Allergic rhinitis, unspecified: Secondary | ICD-10-CM | POA: Diagnosis not present

## 2018-04-16 DIAGNOSIS — G4719 Other hypersomnia: Secondary | ICD-10-CM

## 2018-04-16 NOTE — Patient Instructions (Addendum)
Home sleep study ordered We will contact you when results are available and initiate therapy as indicated  Follow-up will be arranged

## 2018-04-17 ENCOUNTER — Encounter: Payer: Self-pay | Admitting: Pulmonary Disease

## 2018-04-17 NOTE — Progress Notes (Signed)
PULMONARY/SLEEP CONSULT NOTE  Requesting MD/Service: Dale Durhamharlene Scott, MD Date of initial consultation: 04/16/18 Reason for consultation: Snoring, daytime hypersomnolence  PT PROFILE: 53 y.o. female never smoker referred for evaluation of suspected OSA  DATA:  INTERVAL:  HPI:  As above.  She has a longstanding history of snoring and witnessed apneas (per her bed partner).  She now reports progressive daytime hypersomnolence and occasional morning headaches.  She drinks lots of coffee to remain functional during the day.  She also notes that she has been under significant stress (illness of her mother) recently.  Epworth scale score is 7.  She has had a gradual weight gain of approximately 85 pounds since age 53.  Past Medical History:  Diagnosis Date  . Anemia    iron deficient  . Asthma   . Change in bowel movement 12/21/2016  . Cholelithiasis 10/27/2016  . Dizziness 12/26/2014  . Environmental allergies   . Family history of colon cancer 04/25/2013   Colonoscopy 11/18/14 - diverticulosis (sigmoid - mild).  No colorectal neoplasia.  Recommend f/u colonoscopy in five years.    . Fatty liver 10/27/2016  . GERD (gastroesophageal reflux disease) 04/25/2013  . Health care maintenance 04/23/2014  . Hypercholesterolemia   . Hyperthyroidism    s/p ablation  . Hypothyroidism 01/27/2012  . Lung nodule 10/27/2016  . Viral upper respiratory illness 05/30/2014    Past Surgical History:  Procedure Laterality Date  . NO PAST SURGERIES      MEDICATIONS: I have reviewed all medications and confirmed regimen as documented  Social History   Socioeconomic History  . Marital status: Single    Spouse name: Not on file  . Number of children: 0  . Years of education: Not on file  . Highest education level: Not on file  Occupational History  . Not on file  Social Needs  . Financial resource strain: Not on file  . Food insecurity:    Worry: Not on file    Inability: Not on file  .  Transportation needs:    Medical: Not on file    Non-medical: Not on file  Tobacco Use  . Smoking status: Never Smoker  . Smokeless tobacco: Never Used  Substance and Sexual Activity  . Alcohol use: Yes    Alcohol/week: 0.0 standard drinks    Comment: occasional  . Drug use: No  . Sexual activity: Not on file  Lifestyle  . Physical activity:    Days per week: Not on file    Minutes per session: Not on file  . Stress: Not on file  Relationships  . Social connections:    Talks on phone: Not on file    Gets together: Not on file    Attends religious service: Not on file    Active member of club or organization: Not on file    Attends meetings of clubs or organizations: Not on file    Relationship status: Not on file  . Intimate partner violence:    Fear of current or ex partner: Not on file    Emotionally abused: Not on file    Physically abused: Not on file    Forced sexual activity: Not on file  Other Topics Concern  . Not on file  Social History Narrative  . Not on file    Family History  Problem Relation Age of Onset  . Colon cancer Father   . Colon cancer Paternal Uncle        x4  . Asthma Other  cousine  . Breast cancer Neg Hx     ROS: No fever, myalgias/arthralgias, unexplained weight loss or weight gain No new focal weakness or sensory deficits No otalgia, hearing loss, visual changes, nasal and sinus symptoms, mouth and throat problems No neck pain or adenopathy No abdominal pain, N/V/D, diarrhea, change in bowel pattern No dysuria, change in urinary pattern   Vitals:   04/16/18 1518 04/16/18 1529  BP:  118/80  Pulse:  78  Resp: 16   SpO2:  97%  Weight: 214 lb (97.1 kg)   Height: 5\' 3"  (1.6 m)   Room air   EXAM:  Gen: Moderately obese, No overt respiratory distress HEENT: NCAT, sclera white, pharyngeal crowding, mild rhinitis Neck: Supple without LAN, thyromegaly, JVD Lungs: breath sounds full, percussion normal, adventitious sounds:  None Cardiovascular: RRR, no murmurs noted Abdomen: Soft, nontender, normal BS Ext: without clubbing, cyanosis, edema Neuro: CNs grossly intact, motor and sensory intact Skin: Limited exam, no lesions noted  DATA:   BMP Latest Ref Rng & Units 01/30/2018 09/24/2016 04/27/2016  Glucose 70 - 99 mg/dL 998(P) 382(N) 90  BUN 6 - 23 mg/dL 10 12 11   Creatinine 0.40 - 1.20 mg/dL 0.53 9.76 7.34  Sodium 135 - 145 mEq/L 141 135 138  Potassium 3.5 - 5.1 mEq/L 4.1 4.8 3.9  Chloride 96 - 112 mEq/L 102 100(L) 105  CO2 19 - 32 mEq/L 29 23 25   Calcium 8.4 - 10.5 mg/dL 9.5 9.4 9.2    CBC Latest Ref Rng & Units 01/30/2018 09/24/2016 04/27/2016  WBC 4.0 - 10.5 K/uL 9.1 10.0 8.0  Hemoglobin 12.0 - 15.0 g/dL 19.3 79.0 24.0  Hematocrit 36.0 - 46.0 % 39.5 40.4 37.3  Platelets 150.0 - 400.0 K/uL 301.0 255 295.0    CXR: No recent film  I have personally reviewed all chest radiographs reported above including CXRs and CT chest unless otherwise indicated  IMPRESSION:     ICD-10-CM   1. Snoring R06.83 Home sleep test  2. Daytime hypersomnolence G47.19 Home sleep test  3. Chronic allergic rhinitis J30.9    Likely OSA  PLAN:  Home sleep study ordered We will contact her after results are available and initiate therapy as indicated Follow-up will be arranged after initiation of therapy   Billy Fischer, MD PCCM service Mobile [phone] Pager [phone] 04/17/2018 1:04 PM

## 2018-04-23 ENCOUNTER — Other Ambulatory Visit: Payer: Self-pay | Admitting: Internal Medicine

## 2018-04-24 ENCOUNTER — Other Ambulatory Visit: Payer: Self-pay

## 2018-04-24 MED ORDER — ALBUTEROL SULFATE HFA 108 (90 BASE) MCG/ACT IN AERS: 1.0000 | INHALATION_SPRAY | Freq: Four times a day (QID) | RESPIRATORY_TRACT | 3 refills | Status: DC | PRN

## 2018-05-24 ENCOUNTER — Ambulatory Visit (HOSPITAL_COMMUNITY)
Admission: EM | Admit: 2018-05-24 | Discharge: 2018-05-24 | Disposition: A | Discharge status: Home / Self Care | Payer: Commercial Managed Care - PPO | Attending: Family Medicine | Admitting: Family Medicine

## 2018-05-24 ENCOUNTER — Encounter (HOSPITAL_COMMUNITY): Payer: Self-pay

## 2018-05-24 DIAGNOSIS — R059 Cough, unspecified: Secondary | ICD-10-CM

## 2018-05-24 DIAGNOSIS — J0100 Acute maxillary sinusitis, unspecified: Secondary | ICD-10-CM

## 2018-05-24 DIAGNOSIS — J45909 Unspecified asthma, uncomplicated: Secondary | ICD-10-CM

## 2018-05-24 DIAGNOSIS — J40 Bronchitis, not specified as acute or chronic: Secondary | ICD-10-CM

## 2018-05-24 DIAGNOSIS — R05 Cough: Secondary | ICD-10-CM

## 2018-05-24 MED ORDER — PREDNISONE 20 MG PO TABS: 20.0000 mg | ORAL_TABLET | Freq: Every day | ORAL | 0 refills | Status: AC

## 2018-05-24 MED ORDER — BENZONATATE 100 MG PO CAPS: 100.0000 mg | ORAL_CAPSULE | Freq: Three times a day (TID) | ORAL | 0 refills | Status: DC | PRN

## 2018-05-24 MED ORDER — AMOXICILLIN-POT CLAVULANATE 875-125 MG PO TABS: 1.0000 | ORAL_TABLET | Freq: Two times a day (BID) | ORAL | 0 refills | Status: DC

## 2018-05-24 NOTE — ED Triage Notes (Signed)
Pt present cough, sore throat and nasal congestion. Pt states that she is having some tightness in her chest when trying to take deep breathes. Symptoms started on Thursday.

## 2018-05-24 NOTE — Discharge Instructions (Signed)
If symptoms worsen or do not improve return for follow-up. Use albuterol for any acute shortness of breath or wheezing.

## 2018-05-24 NOTE — ED Provider Notes (Signed)
[facility]    CSN: [id number] Arrival date & time: 05/24/18  1003     History   Chief Complaint Chief Complaint  Patient presents with  . Cough  . Sore Throat  . Nasal Congestion    HPI Bethany Cook is a 53 y.o. female.   HPI  Patient presents with symptoms of shortness of breath, sore throat, nasal congestion.  Persistent nonproductive cough, and fatigue, and subjective fever x3 days.  Patient reports exposure to influenza A.  Patient has a history of chronic asthma and allergies.  Patient reports persistent cough.  She is compliant with her asthma regimen.  She endorses chest tightness not chest pain.  She has worsening shortness of breath over the last 24 hours with exertion and with coughing.  She is only taken Motrin over-the-counter along with her chronic allergy and asthma medications.  She reports a measurable fever of 100 last night.  She is afebrile on arrival today.  No history of pneumonia.  Past Medical History:  Diagnosis Date  . Anemia    iron deficient  . Asthma   . Change in bowel movement 12/21/2016  . Cholelithiasis 10/27/2016  . Dizziness 12/26/2014  . Environmental allergies   . Family history of colon cancer 04/25/2013   Colonoscopy 11/18/14 - diverticulosis (sigmoid - mild).  No colorectal neoplasia.  Recommend f/u colonoscopy in five years.    . Fatty liver 10/27/2016  . GERD (gastroesophageal reflux disease) 04/25/2013  . Health care maintenance 04/23/2014  . Hypercholesterolemia   . Hyperthyroidism    s/p ablation  . Hypothyroidism 01/27/2012  . Lung nodule 10/27/2016  . Viral upper respiratory illness 05/30/2014    Patient Active Problem List   Diagnosis Date Noted  . Witnessed episode of apnea 04/06/2018  . Stress 12/17/2017  . Change in bowel movement 12/21/2016  . Cholelithiasis 10/27/2016  . Fatty liver 10/27/2016  . Lung nodule 10/27/2016  . Abdominal pain 04/27/2016  . Dizziness 12/26/2014  . Soft tissue swelling 12/26/2014   . Environmental allergies 04/23/2014  . Health care maintenance 04/23/2014  . Nasal drainage 04/01/2014  . GERD (gastroesophageal reflux disease) 04/25/2013  . Family history of colon cancer 04/25/2013  . Asthma 04/01/2012  . Anemia 04/01/2012  . Hypercholesterolemia 04/01/2012  . Hypothyroidism 01/27/2012    Past Surgical History:  Procedure Laterality Date  . NO PAST SURGERIES      OB History   No obstetric history on file.      Home Medications    Prior to Admission medications   Medication Sig Start Date End Date Taking? Authorizing Provider  albuterol (PROVENTIL HFA;VENTOLIN HFA) 108 (90 Base) MCG/ACT inhaler Inhale 1 puff into the lungs every 6 (six) hours as needed for wheezing or shortness of breath. 04/24/18   Dale [place], MD  budesonide-formoterol [facility]) 160-4.5 MCG/ACT inhaler Inhale 2 puffs into the lungs 2 (two) times daily.    [provider]  escitalopram (LEXAPRO) 20 MG tablet TAKE 1 TABLET BY MOUTH  DAILY 11/01/17   Dale [place], MD  levocetirizine (XYZAL) 5 MG tablet Take 5 mg by mouth every evening.    [provider]  levothyroxine (SYNTHROID, LEVOTHROID) 112 MCG tablet TAKE 1 TABLET BY MOUTH  DAILY 04/17/18   Dale [place], MD  montelukast (SINGULAIR) 10 MG tablet TAKE 1 TABLET BY MOUTH AT  BEDTIME 09/20/17   Dale [place], MD  omeprazole (PRILOSEC) 40 MG capsule TAKE 1 CAPSULE BY MOUTH  DAILY 04/23/18   Dale [place],  MD  pravastatin (PRAVACHOL) 10 MG tablet TAKE 1 TABLET BY MOUTH  EVERY NIGHT 02/17/18   Dale [place], MD  zolpidem (AMBIEN) 10 MG tablet Take 1 tablet by mouth at bedtime as needed. 03/17/18   [provider]    Family History Family History  Problem Relation Age of Onset  . Colon cancer Father   . Colon cancer Paternal Uncle        x4  . Asthma Other        cousine  . Breast cancer Neg Hx     Social History Social History   Tobacco Use  . Smoking status: Never Smoker  . Smokeless  tobacco: Never Used  Substance Use Topics  . Alcohol use: Yes    Alcohol/week: 0.0 standard drinks    Comment: occasional  . Drug use: No     Allergies   Erythromycin   Review of Systems Review of Systems Pertinent negatives listed in HPI  Physical Exam Triage Vital Signs ED Triage Vitals  Enc Vitals Group     BP      Pulse      Resp      Temp      Temp src      SpO2      Weight      Height      Head Circumference      Peak Flow      Pain Score      Pain Loc      Pain Edu?      Excl. in GC?    No data found.  Updated Vital Signs BP 129/73 (BP Location: Left Arm)   Pulse 79   Temp 98 F (36.7 C) (Oral)   Resp 16   SpO2 97%   Visual Acuity Right Eye Distance:   Left Eye Distance:   Bilateral Distance:    Right Eye Near:   Left Eye Near:    Bilateral Near:     Physical Exam Constitutional:      Appearance: She is well-developed. She is ill-appearing.  HENT:     Head: Normocephalic and atraumatic.     Nose: Congestion and rhinorrhea present.     Mouth/Throat:     Pharynx: Oropharynx is clear. No pharyngeal swelling, oropharyngeal exudate or posterior oropharyngeal erythema.  Eyes:     Comments: Bilateral redness and watery eyes  Neck:     Musculoskeletal: Normal range of motion.  Cardiovascular:     Rate and Rhythm: Normal rate and regular rhythm.  Pulmonary:     Breath sounds: No wheezing.     Comments: Diminished breath sounds throughout  Chest:     Chest wall: Tenderness present.  Abdominal:     Palpations: Abdomen is soft.  Lymphadenopathy:     Cervical: No cervical adenopathy.  Skin:    General: Skin is warm.  Neurological:     General: No focal deficit present.  Psychiatric:        Mood and Affect: Mood normal.        Behavior: Behavior normal.      UC Treatments / Results  Labs (all labs ordered are listed, but only abnormal results are displayed) Labs Reviewed - No data to display  EKG None  Radiology No results  found.  Procedures Procedures (including critical care time)  Medications Ordered in UC Medications - No data to display  Initial Impression / Assessment and Plan / UC Course  I have reviewed the triage vital  signs and the nursing notes.  Pertinent labs & imaging results that were available during my care of the patient were reviewed by me and considered in my medical decision making (see chart for details).   Patient with a history of asthma presents with upper respiratory symptoms and exam findings consistent with sinusitis and likely bronchitis.  Patient is compliant with asthma regimen.  Advised to continue asthma treatment, adding Augmentin antibiotics for treatment of sinusitis and probable bronchitis.  Benzonatate for cough.  Patient advised to follow-up with PCP if symptoms worsen or do not improve.  Before while  Final Clinical Impressions(s) / UC Diagnoses   Final diagnoses:  Acute non-recurrent maxillary sinusitis  Bronchitis  Uncomplicated asthma, unspecified asthma severity, unspecified whether persistent  Cough     Discharge Instructions     If symptoms worsen or do not improve return for follow-up. Use albuterol for any acute shortness of breath or wheezing.    ED Prescriptions    Medication Sig Dispense Auth. Provider   predniSONE (DELTASONE) 20 MG tablet Take 1 tablet (20 mg total) by mouth daily with breakfast for 5 days. 5 tablet Bing Neighbors, FNP   amoxicillin-clavulanate (AUGMENTIN) 875-125 MG tablet Take 1 tablet by mouth 2 (two) times daily. 20 tablet Bing Neighbors, FNP   benzonatate (TESSALON) 100 MG capsule Take 1-2 capsules (100-200 mg total) by mouth 3 (three) times daily as needed for cough. 60 capsule Bing Neighbors, FNP     Controlled Substance Prescriptions [place] Controlled Substance Registry consulted? No   Bing Neighbors, FNP 05/24/18 1100

## 2018-05-25 ENCOUNTER — Encounter: Payer: Self-pay | Admitting: Internal Medicine

## 2018-05-26 ENCOUNTER — Other Ambulatory Visit: Payer: Self-pay

## 2018-05-26 MED ORDER — ALBUTEROL SULFATE (2.5 MG/3ML) 0.083% IN NEBU: 2.5000 mg | INHALATION_SOLUTION | Freq: Four times a day (QID) | RESPIRATORY_TRACT | 1 refills | Status: AC | PRN

## 2018-05-26 NOTE — Telephone Encounter (Signed)
I am ok to refill neb.  I was going to send in rx, but was not sure where to send.  [facility] pharmacy marked, but if acute symptoms, needs to be sent to local pharmacy.  Ok to send in refill and confirm what she needs is albuterol nebs.

## 2018-05-26 NOTE — Telephone Encounter (Signed)
Albuterol nebs sent in to [facility] as requested. Confirmed with pt that is the nebulizer she needed.

## 2018-05-26 NOTE — Telephone Encounter (Signed)
Are you okay with sending in nebulizer solution for her? She has not had it filled since 01/2017

## 2018-06-17 ENCOUNTER — Other Ambulatory Visit: Payer: Self-pay | Admitting: Internal Medicine

## 2018-07-09 ENCOUNTER — Ambulatory Visit: Payer: Commercial Managed Care - PPO

## 2018-07-09 ENCOUNTER — Other Ambulatory Visit: Payer: Self-pay

## 2018-07-09 DIAGNOSIS — G4733 Obstructive sleep apnea (adult) (pediatric): Secondary | ICD-10-CM | POA: Diagnosis not present

## 2018-07-09 DIAGNOSIS — G4719 Other hypersomnia: Secondary | ICD-10-CM

## 2018-07-09 DIAGNOSIS — R0683 Snoring: Secondary | ICD-10-CM

## 2018-07-11 ENCOUNTER — Telehealth: Payer: Self-pay

## 2018-07-11 ENCOUNTER — Other Ambulatory Visit: Payer: Self-pay | Admitting: Internal Medicine

## 2018-07-11 DIAGNOSIS — G4733 Obstructive sleep apnea (adult) (pediatric): Secondary | ICD-10-CM | POA: Diagnosis not present

## 2018-07-11 NOTE — Telephone Encounter (Signed)
Spoke to patient regarding sleep study results.   Severe OSA AHI of 41. Recommend auto-CPAP with pressure range of 5-20 cm H2O.   Patient aware, orders entered.

## 2018-08-05 ENCOUNTER — Ambulatory Visit: Payer: Commercial Managed Care - PPO | Admitting: Internal Medicine

## 2018-08-10 ENCOUNTER — Other Ambulatory Visit: Payer: Self-pay | Admitting: Internal Medicine

## 2018-09-08 ENCOUNTER — Telehealth: Payer: Self-pay | Admitting: Pulmonary Disease

## 2018-09-08 NOTE — Telephone Encounter (Signed)
Received face to face request for cpap compliance from [place].  Per our records pt was set up on cpap 07/11/2018. Pt does not currently have pending OV.  Pt has been scheduled for OV on 10/03/2018. Nothing further is needed at this time.

## 2018-09-09 ENCOUNTER — Encounter: Payer: Self-pay | Admitting: Internal Medicine

## 2018-09-09 ENCOUNTER — Ambulatory Visit (INDEPENDENT_AMBULATORY_CARE_PROVIDER_SITE_OTHER): Payer: Commercial Managed Care - PPO | Admitting: Internal Medicine

## 2018-09-09 ENCOUNTER — Other Ambulatory Visit: Payer: Self-pay

## 2018-09-09 DIAGNOSIS — F439 Reaction to severe stress, unspecified: Secondary | ICD-10-CM

## 2018-09-09 DIAGNOSIS — E039 Hypothyroidism, unspecified: Secondary | ICD-10-CM

## 2018-09-09 DIAGNOSIS — Z9109 Other allergy status, other than to drugs and biological substances: Secondary | ICD-10-CM

## 2018-09-09 DIAGNOSIS — Z800 Family history of malignant neoplasm of digestive organs: Secondary | ICD-10-CM

## 2018-09-09 DIAGNOSIS — D649 Anemia, unspecified: Secondary | ICD-10-CM | POA: Diagnosis not present

## 2018-09-09 DIAGNOSIS — K219 Gastro-esophageal reflux disease without esophagitis: Secondary | ICD-10-CM

## 2018-09-09 DIAGNOSIS — E7800 Pure hypercholesterolemia, unspecified: Secondary | ICD-10-CM

## 2018-09-09 DIAGNOSIS — J4520 Mild intermittent asthma, uncomplicated: Secondary | ICD-10-CM

## 2018-09-09 DIAGNOSIS — K760 Fatty (change of) liver, not elsewhere classified: Secondary | ICD-10-CM

## 2018-09-09 NOTE — Progress Notes (Signed)
Patient ID: Bethany Cook, female   DOB: 03-Dec-1965, 53 y.o.   MRN: [id number]   Virtual Visit via video Note  This visit type was conducted due to national recommendations for restrictions regarding the COVID-19 pandemic (e.g. social distancing).  This format is felt to be most appropriate for this patient at this time.  All issues noted in this document were discussed and addressed.  No physical exam was performed (except for noted visual exam findings with Video Visits).   I connected with Ronette Deter by a video enabled telemedicine application and verified that I am speaking with the correct person using two identifiers. Location patient: home Location provider: work  Persons participating in the virtual visit: patient, provider  I discussed the limitations, risks, security and privacy concerns of performing an evaluation and management service by video and the availability of in person appointments.  The patient expressed understanding and agreed to proceed.  We were able to connect initially via video and able to complete part of the visit with video.  We then had to convert the visit to a telephone visit.  We continued the visit with audio only.  Pt was in agreement.     Reason for visit: scheduled follow up.   HPI: She reports she is doing relatively well.  Increased stress with her mother's health issues.  Mother has metastatic melanoma - to lungs.  Overall she feels she is handling things relatively well.  Does not feel needs any further intervention.  Sees gyn.  Had pap 07/26/17 - ok.  mammo ok - 07/29/17.  Followed by Dr Corinna Capra.  Using cpap.  Trying to stay active.  No chest pain.  Breathing doing well.  Sees Dr Alva Garnet.  Has f/u planned in 2 weeks.  No acid reflux.  No abdominal pain.  Bowels moving.     ROS: See pertinent positives and negatives per HPI.  Past Medical History:  Diagnosis Date  . Anemia    iron deficient  . Asthma   . Change in bowel movement 12/21/2016  .  Cholelithiasis 10/27/2016  . Dizziness 12/26/2014  . Environmental allergies   . Family history of colon cancer 04/25/2013   Colonoscopy 11/18/14 - diverticulosis (sigmoid - mild).  No colorectal neoplasia.  Recommend f/u colonoscopy in five years.    . Fatty liver 10/27/2016  . GERD (gastroesophageal reflux disease) 04/25/2013  . Health care maintenance 04/23/2014  . Hypercholesterolemia   . Hyperthyroidism    s/p ablation  . Hypothyroidism 01/27/2012  . Lung nodule 10/27/2016  . Viral upper respiratory illness 05/30/2014    Past Surgical History:  Procedure Laterality Date  . NO PAST SURGERIES      Family History  Problem Relation Age of Onset  . Colon cancer Father   . Colon cancer Paternal Uncle        x4  . Asthma Other        cousine  . Breast cancer Neg Hx     SOCIAL HX: reviewed.    Current Outpatient Medications:  .  albuterol (PROVENTIL HFA;VENTOLIN HFA) 108 (90 Base) MCG/ACT inhaler, Inhale 1 puff into the lungs every 6 (six) hours as needed for wheezing or shortness of breath., Disp: 21 Inhaler, Rfl: 3 .  albuterol (PROVENTIL) (2.5 MG/3ML) 0.083% nebulizer solution, Take 3 mLs (2.5 mg total) by nebulization every 6 (six) hours as needed for wheezing or shortness of breath., Disp: 150 mL, Rfl: 1 .  amoxicillin-clavulanate (AUGMENTIN) 875-125 MG tablet, Take 1 tablet  by mouth 2 (two) times daily., Disp: 20 tablet, Rfl: 0 .  benzonatate (TESSALON) 100 MG capsule, Take 1-2 capsules (100-200 mg total) by mouth 3 (three) times daily as needed for cough., Disp: 60 capsule, Rfl: 0 .  budesonide-formoterol (SYMBICORT) 160-4.5 MCG/ACT inhaler, Inhale 2 puffs into the lungs 2 (two) times daily., Disp: , Rfl:  .  escitalopram (LEXAPRO) 20 MG tablet, TAKE 1 TABLET BY MOUTH  DAILY, Disp: 30 tablet, Rfl: 1 .  levocetirizine (XYZAL) 5 MG tablet, Take 5 mg by mouth every evening., Disp: , Rfl:  .  levothyroxine (SYNTHROID) 112 MCG tablet, TAKE 1 TABLET BY MOUTH  DAILY, Disp: 90 tablet,  Rfl: 0 .  montelukast (SINGULAIR) 10 MG tablet, TAKE 1 TABLET BY MOUTH AT  BEDTIME, Disp: 90 tablet, Rfl: 1 .  omeprazole (PRILOSEC) 40 MG capsule, TAKE 1 CAPSULE BY MOUTH  DAILY, Disp: 90 capsule, Rfl: 0 .  pravastatin (PRAVACHOL) 10 MG tablet, TAKE 1 TABLET BY MOUTH  EVERY NIGHT, Disp: 90 tablet, Rfl: 2 .  zolpidem (AMBIEN) 10 MG tablet, Take 1 tablet by mouth at bedtime as needed., Disp: , Rfl:   EXAM:  GENERAL: alert, oriented, appears well and in no acute distress  HEENT: atraumatic, conjunttiva clear, no obvious abnormalities on inspection of external nose and ears  NECK: normal movements of the head and neck  LUNGS: on inspection no signs of respiratory distress, breathing rate appears normal, no obvious gross SOB, gasping or wheezing  CV: no obvious cyanosis  PSYCH/NEURO: pleasant and cooperative, no obvious depression or anxiety, speech and thought processing grossly intact  ASSESSMENT AND PLAN:  Discussed the following assessment and plan:  Anemia Follow cbc.   Asthma Breathing stable.  Doing better.  Follow.  Continue current regimen.    Environmental allergies Controlled on current regimen.  Follow.    Family history of colon cancer Colonoscopy 11/2014.  Recommended f/u colonoscopy in 5 years.    Fatty liver Low carb diet and exercise.  Follow liver function tests.    GERD (gastroesophageal reflux disease) Controlled on current regimen.    Hypercholesterolemia On pravastatin.  Low cholesterol diet and exercise.  Follow lipid panel and liver function tests.    Hypothyroidism On thyroid replacement.  Follow tsh.    Stress Increased stress as outlined.  On lexapro.  Doing well.  Does not feel needs any further intervention.  Follow.      I discussed the assessment and treatment plan with the patient. The patient was provided an opportunity to ask questions and all were answered. The patient agreed with the plan and demonstrated an understanding of the  instructions.   The patient was advised to call back or seek an in-person evaluation if the symptoms worsen or if the condition fails to improve as anticipated.   Dale Durhamharlene Danniell Rotundo, MD

## 2018-09-13 ENCOUNTER — Encounter: Payer: Self-pay | Admitting: Internal Medicine

## 2018-09-13 NOTE — Assessment & Plan Note (Signed)
Breathing stable.  Doing better.  Follow.  Continue current regimen.

## 2018-09-13 NOTE — Assessment & Plan Note (Signed)
Follow cbc.  

## 2018-09-13 NOTE — Assessment & Plan Note (Signed)
On thyroid replacement.  Follow tsh.  

## 2018-09-13 NOTE — Assessment & Plan Note (Signed)
On pravastatin.  Low cholesterol diet and exercise.  Follow lipid panel and liver function tests.   

## 2018-09-13 NOTE — Assessment & Plan Note (Signed)
Increased stress as outlined.  On lexapro.  Doing well.  Does not feel needs any further intervention.  Follow.

## 2018-09-13 NOTE — Assessment & Plan Note (Signed)
Low carb diet and exercise.  Follow liver function tests.   

## 2018-09-13 NOTE — Assessment & Plan Note (Signed)
Colonoscopy 11/2014.  Recommended f/u colonoscopy in 5 years.  

## 2018-09-13 NOTE — Assessment & Plan Note (Signed)
Controlled on current regimen.  Follow.  

## 2018-09-13 NOTE — Assessment & Plan Note (Signed)
Controlled on current regimen.   

## 2018-10-03 ENCOUNTER — Encounter: Payer: Self-pay | Admitting: Pulmonary Disease

## 2018-10-03 ENCOUNTER — Ambulatory Visit (INDEPENDENT_AMBULATORY_CARE_PROVIDER_SITE_OTHER): Payer: Commercial Managed Care - PPO | Admitting: Pulmonary Disease

## 2018-10-03 DIAGNOSIS — Z9989 Dependence on other enabling machines and devices: Secondary | ICD-10-CM | POA: Diagnosis not present

## 2018-10-03 DIAGNOSIS — E668 Other obesity: Secondary | ICD-10-CM | POA: Diagnosis not present

## 2018-10-03 DIAGNOSIS — G4733 Obstructive sleep apnea (adult) (pediatric): Secondary | ICD-10-CM

## 2018-10-03 NOTE — Progress Notes (Addendum)
PULMONARY/SLEEP OFFICE FOLLOW-UP NOTE  Requesting MD/Service: Dale Durhamharlene Scott, MD Date of initial consultation: 04/16/18 Reason for consultation: Snoring, daytime hypersomnolence  PT PROFILE: 53 y.o. female never smoker referred for evaluation of suspected OSA  DATA: 07/09/18 PSG: severe OSA. AHI 41/hr. Recommended Autoset 5-20 cm H2O 06/22-07/21/20 CPAP compliance: AutoSet 5-20 cm H2O.  Usage: 30/30 nights.  Median pressure 12.2 cm H2O.  Maximum pressure 17.1 cm H2O.  Mean AHI 12.6/hr   Virtual Visit via Telephone Note I connected with Cherylynn RidgesMarsha D Irby on 10/03/18 at  9:45 AM EDT by telephone and verified that I am speaking with the correct person using two identifiers. I discussed the limitations, risks, security and privacy concerns of performing an evaluation and management service by telephone and the availability of in person appointments. I also discussed with the patient that there may be a patient responsible charge related to this service. The patient expressed understanding and agreed to proceed.   INTERVAL: Initial evaluation 04/16/2018.  Since that time, she has been diagnosed with severe obstructive sleep apnea and started on AutoSet.  SUBJ:  This is a scheduled follow-up which was performed remotely due to the coronavirus pandemic.  Sleep study results and CPAP compliance reports are documented above.  She is compliant with CPAP therapy and feels that daytime hypersomnolence is improved.  She is using a full facemask which she has some difficulty tolerating.  She does find that she is awakened sometimes in the early night as the CPAP pressure is "ramping up". Her DME company is [employer].  Overall she is moderately satisfied with her improvement.   There were no vitals filed for this visit.   EXAM:  Due to the remote nature of this encounter, no physical examination could be performed  DATA:   BMP Latest Ref Rng & Units 01/30/2018 09/24/2016 04/27/2016  Glucose 70 - 99 mg/dL  045(W102(H) 098(J110(H) 90  BUN 6 - 23 mg/dL 10 12 11   Creatinine 0.40 - 1.20 mg/dL 1.910.88 4.780.89 2.950.81  Sodium 135 - 145 mEq/L 141 135 138  Potassium 3.5 - 5.1 mEq/L 4.1 4.8 3.9  Chloride 96 - 112 mEq/L 102 100(L) 105  CO2 19 - 32 mEq/L 29 23 25   Calcium 8.4 - 10.5 mg/dL 9.5 9.4 9.2    CBC Latest Ref Rng & Units 01/30/2018 09/24/2016 04/27/2016  WBC 4.0 - 10.5 K/uL 9.1 10.0 8.0  Hemoglobin 12.0 - 15.0 g/dL 62.113.3 30.813.3 65.712.7  Hematocrit 36.0 - 46.0 % 39.5 40.4 37.3  Platelets 150.0 - 400.0 K/uL 301.0 255 295.0    CXR: No recent film  I have personally reviewed all chest radiographs reported above including CXRs and CT chest unless otherwise indicated  IMPRESSION:     ICD-10-CM   1. Severe OSA on CPAP  G47.33 AMB REFERRAL FOR DME   Z99.89   2. Moderate obesity  E66.8    Although she is symptomatically improved, she continues to have an inordinately high AHI.  I suspect that this is occurring early in sleep as the CPAP is ramping up.  She also notes that this ramp-up often awakens her from sleep.  Therefore, we will make some adjustments on the CPAP AutoSet.  PLAN:  Change CPAP AutoSet to 8-20 cm H2O.  We will contact [employer]  We again discussed the importance of weight loss and the relationship between obesity and sleep apnea  Follow-up in 6 months.  Call sooner if needed   Billy Fischeravid , MD PCCM service Mobile [phone] Pager [phone] 10/03/2018 2:16  PM    ADDENDUM: As clearly indicated above, the patient is compliant with and benefiting from CPAP therapy  Merton Border, MD PCCM service Mobile [phone] Pager [phone] 10/07/2018 12:57 PM

## 2018-10-03 NOTE — Patient Instructions (Addendum)
Continue CPAP with sleep. We will contact [facility] and asked them to change the settings on the AutoSet from 5-20 cm H2O to 8-20 cm H2O  We again discussed the importance of weight loss and the relationship between excessive weight and sleep apnea  Follow-up in 6 months.  Call sooner if needed

## 2018-10-04 ENCOUNTER — Other Ambulatory Visit: Payer: Self-pay | Admitting: Internal Medicine

## 2018-10-06 ENCOUNTER — Telehealth: Payer: Self-pay | Admitting: Pulmonary Disease

## 2018-10-06 NOTE — Telephone Encounter (Signed)
Will route message to [place].

## 2018-10-06 NOTE — Telephone Encounter (Signed)
Bethany Cook is calling back [phone]  [phone]

## 2018-10-06 NOTE — Telephone Encounter (Signed)
LM for [place] to see what she needs for patient.

## 2018-10-07 NOTE — Telephone Encounter (Addendum)
I spoke to Bethany Cook, she is stating that Dr. Alva Garnet note needs to state that the patient is using and benefiting from CPAP therapy for insurance to continue to pay.   DS can you addend your note to reflect this?  We then need to fax to [phone].

## 2018-10-07 NOTE — Telephone Encounter (Signed)
Left message for [place].

## 2018-10-07 NOTE — Telephone Encounter (Signed)
Office note faxed to [employer].

## 2018-10-07 NOTE — Telephone Encounter (Signed)
This was clearly indicated in my note. They obviously do not read the notes. Nonetheless, I have re-iterated in in an [place]

## 2018-10-27 ENCOUNTER — Other Ambulatory Visit: Payer: Self-pay | Admitting: Internal Medicine

## 2018-11-24 ENCOUNTER — Other Ambulatory Visit: Payer: Self-pay | Admitting: Internal Medicine

## 2018-12-04 ENCOUNTER — Other Ambulatory Visit: Payer: Self-pay | Admitting: Internal Medicine

## 2019-03-06 ENCOUNTER — Other Ambulatory Visit: Payer: Self-pay | Admitting: Internal Medicine

## 2019-03-20 ENCOUNTER — Other Ambulatory Visit: Payer: Self-pay | Admitting: Internal Medicine

## 2019-04-13 ENCOUNTER — Emergency Department (HOSPITAL_COMMUNITY)
Admission: EM | Admit: 2019-04-13 | Discharge: 2019-04-13 | Disposition: A | Discharge status: Home / Self Care | Payer: Commercial Managed Care - PPO | Attending: Emergency Medicine | Admitting: Emergency Medicine

## 2019-04-13 ENCOUNTER — Other Ambulatory Visit: Payer: Self-pay

## 2019-04-13 ENCOUNTER — Emergency Department (HOSPITAL_COMMUNITY): Payer: Commercial Managed Care - PPO

## 2019-04-13 ENCOUNTER — Encounter (HOSPITAL_COMMUNITY): Payer: Self-pay

## 2019-04-13 DIAGNOSIS — W001XXA Fall from stairs and steps due to ice and snow, initial encounter: Secondary | ICD-10-CM | POA: Diagnosis not present

## 2019-04-13 DIAGNOSIS — E039 Hypothyroidism, unspecified: Secondary | ICD-10-CM | POA: Diagnosis not present

## 2019-04-13 DIAGNOSIS — S7001XA Contusion of right hip, initial encounter: Secondary | ICD-10-CM | POA: Insufficient documentation

## 2019-04-13 DIAGNOSIS — Y9301 Activity, walking, marching and hiking: Secondary | ICD-10-CM | POA: Diagnosis not present

## 2019-04-13 DIAGNOSIS — Y92008 Other place in unspecified non-institutional (private) residence as the place of occurrence of the external cause: Secondary | ICD-10-CM | POA: Insufficient documentation

## 2019-04-13 DIAGNOSIS — J45909 Unspecified asthma, uncomplicated: Secondary | ICD-10-CM | POA: Diagnosis not present

## 2019-04-13 DIAGNOSIS — Z79899 Other long term (current) drug therapy: Secondary | ICD-10-CM | POA: Diagnosis not present

## 2019-04-13 DIAGNOSIS — W19XXXA Unspecified fall, initial encounter: Secondary | ICD-10-CM

## 2019-04-13 DIAGNOSIS — S79911A Unspecified injury of right hip, initial encounter: Secondary | ICD-10-CM | POA: Diagnosis present

## 2019-04-13 DIAGNOSIS — Y999 Unspecified external cause status: Secondary | ICD-10-CM | POA: Insufficient documentation

## 2019-04-13 MED ORDER — CYCLOBENZAPRINE HCL 10 MG PO TABS: 10.0000 mg | ORAL_TABLET | Freq: Two times a day (BID) | ORAL | 0 refills | Status: AC | PRN

## 2019-04-13 MED ORDER — CYCLOBENZAPRINE HCL 10 MG PO TABS
5.0000 mg | ORAL_TABLET | Freq: Once | ORAL | Status: AC
  Administered 2019-04-13: 5 mg via ORAL
  Filled 2019-04-13: qty 1

## 2019-04-13 MED ORDER — NAPROXEN 250 MG PO TABS
500.0000 mg | ORAL_TABLET | Freq: Once | ORAL | Status: AC
  Administered 2019-04-13: 500 mg via ORAL
  Filled 2019-04-13: qty 2

## 2019-04-13 MED ORDER — CYCLOBENZAPRINE HCL 10 MG PO TABS: 10.0000 mg | ORAL_TABLET | Freq: Two times a day (BID) | ORAL | 0 refills | Status: DC | PRN

## 2019-04-13 MED ORDER — NAPROXEN 500 MG PO TABS: 500.0000 mg | ORAL_TABLET | Freq: Two times a day (BID) | ORAL | 0 refills | Status: DC

## 2019-04-13 NOTE — ED Triage Notes (Signed)
Pt slipped on ice and fell down 3 steps this morning, denies loc or hitting her head but states her ears are ringing. Pt c.o right sided lower back pain that radiates to her hip. Denies neck pain. Pt a.o, tearful in triage. Ambulatory upon arrival. Pt is not taking any blood thinners

## 2019-04-13 NOTE — Discharge Instructions (Signed)
You are seen today for a fall.  Your x-rays show no fracture, break, dislocation.  You will likely be sore for several days.  We have prescribed you some medication that you can take on an as-needed basis.  Apply ice to the areas of pain for 20 minutes at a time.  You can alternate ice and heat as well. Thank you for allowing me to care for you today. Please return to the emergency department if you have new or worsening symptoms. Take your medications as instructed.

## 2019-04-13 NOTE — ED Provider Notes (Signed)
[place] EMERGENCY DEPARTMENT Provider Note   CSN: [id number] Arrival date & time: 04/13/19  5732     History Chief Complaint  Patient presents with  . Fall  . Hip Pain    Bethany Cook is a 54 y.o. female.  Patient is a 54 year old female with history listed below presenting to the emergency department for right hip and back pain after fall.  Patient reports that this morning she was walking down her porch steps when she slipped on an icy surface.  Reports that she slipped down 2 steps.  Reports landing on her right hip.  Denies hitting head or passing out.  She is not on any anticoagulation and has not taken any medication prior to arrival.  She was able to get up on her own and has been ambulatory but reports she has a lot of pain with ambulation on the right lateral and posterior hip.  Denies any numbness, tingling, saddle anesthesia, radiation into the lower legs or toes.        Past Medical History:  Diagnosis Date  . Anemia    iron deficient  . Asthma   . Change in bowel movement 12/21/2016  . Cholelithiasis 10/27/2016  . Dizziness 12/26/2014  . Environmental allergies   . Family history of colon cancer 04/25/2013   Colonoscopy 11/18/14 - diverticulosis (sigmoid - mild).  No colorectal neoplasia.  Recommend f/u colonoscopy in five years.    . Fatty liver 10/27/2016  . GERD (gastroesophageal reflux disease) 04/25/2013  . Health care maintenance 04/23/2014  . Hypercholesterolemia   . Hyperthyroidism    s/p ablation  . Hypothyroidism 01/27/2012  . Lung nodule 10/27/2016  . Viral upper respiratory illness 05/30/2014    Patient Active Problem List   Diagnosis Date Noted  . Witnessed episode of apnea 04/06/2018  . Stress 12/17/2017  . Change in bowel movement 12/21/2016  . Cholelithiasis 10/27/2016  . Fatty liver 10/27/2016  . Lung nodule 10/27/2016  . Abdominal pain 04/27/2016  . Dizziness 12/26/2014  . Soft tissue swelling 12/26/2014  .  Environmental allergies 04/23/2014  . Health care maintenance 04/23/2014  . Nasal drainage 04/01/2014  . GERD (gastroesophageal reflux disease) 04/25/2013  . Family history of colon cancer 04/25/2013  . Asthma 04/01/2012  . Anemia 04/01/2012  . Hypercholesterolemia 04/01/2012  . Hypothyroidism 01/27/2012    Past Surgical History:  Procedure Laterality Date  . NO PAST SURGERIES       OB History   No obstetric history on file.     Family History  Problem Relation Age of Onset  . Colon cancer Father   . Colon cancer Paternal Uncle        x4  . Asthma Other        cousine  . Breast cancer Neg Hx     Social History   Tobacco Use  . Smoking status: Never Smoker  . Smokeless tobacco: Never Used  Substance Use Topics  . Alcohol use: Yes    Alcohol/week: 0.0 standard drinks    Comment: occasional  . Drug use: No    Home Medications Prior to Admission medications   Medication Sig Start Date End Date Taking? Authorizing Provider  albuterol (PROVENTIL HFA;VENTOLIN HFA) 108 (90 Base) MCG/ACT inhaler Inhale 1 puff into the lungs every 6 (six) hours as needed for wheezing or shortness of breath. 04/24/18   Einar Pheasant, MD  albuterol (PROVENTIL) (2.5 MG/3ML) 0.083% nebulizer solution Take 3 mLs (2.5 mg total) by nebulization  every 6 (six) hours as needed for wheezing or shortness of breath. 05/26/18   Dale Camp, MD  amoxicillin-clavulanate (AUGMENTIN) 875-125 MG tablet Take 1 tablet by mouth 2 (two) times daily. 05/24/18   Bing Neighbors, FNP  benzonatate (TESSALON) 100 MG capsule Take 1-2 capsules (100-200 mg total) by mouth 3 (three) times daily as needed for cough. 05/24/18   Bing Neighbors, FNP  budesonide-formoterol (SYMBICORT) 160-4.5 MCG/ACT inhaler Inhale 2 puffs into the lungs 2 (two) times daily.    [provider]  cyclobenzaprine (FLEXERIL) 10 MG tablet Take 1 tablet (10 mg total) by mouth 2 (two) times daily as needed for up to 7 days for muscle  spasms. 04/13/19 04/20/19  Ronnie Doss A, PA-C  escitalopram (LEXAPRO) 20 MG tablet TAKE 1 TABLET BY MOUTH  DAILY 03/23/19   Dale [place], MD  levocetirizine (XYZAL) 5 MG tablet Take 5 mg by mouth every evening.    [provider]  levothyroxine (SYNTHROID) 112 MCG tablet TAKE 1 TABLET BY MOUTH  DAILY 10/06/18   Dale [place], MD  montelukast (SINGULAIR) 10 MG tablet TAKE 1 TABLET BY MOUTH AT  BEDTIME 12/05/18   Dale [place], MD  naproxen (NAPROSYN) 500 MG tablet Take 1 tablet (500 mg total) by mouth 2 (two) times daily. 04/13/19   Arlyn Dunning, PA-C  omeprazole (PRILOSEC) 40 MG capsule TAKE 1 CAPSULE BY MOUTH  DAILY 03/09/19   Dale [place], MD  pravastatin (PRAVACHOL) 10 MG tablet TAKE 1 TABLET BY MOUTH  EVERY NIGHT 10/28/18   Dale [place], MD  zolpidem (AMBIEN) 10 MG tablet Take 1 tablet by mouth at bedtime as needed. 03/17/18   [provider]    Allergies    Erythromycin  Review of Systems   Review of Systems  Constitutional: Negative for fever.  Respiratory: Negative for shortness of breath.   Cardiovascular: Negative for chest pain.  Genitourinary: Negative for flank pain.  Musculoskeletal: Positive for arthralgias, back pain and gait problem. Negative for joint swelling, myalgias, neck pain and neck stiffness.  Skin: Negative for rash and wound.  Neurological: Positive for dizziness. Negative for weakness, light-headedness and headaches.  Hematological: Does not bruise/bleed easily.    Physical Exam Updated Vital Signs BP 136/72 (BP Location: Right Arm)   Pulse 60   Temp 97.7 F (36.5 C) (Oral)   Resp 18   SpO2 99%   Physical Exam Vitals and nursing note reviewed.  Constitutional:      General: She is not in acute distress.    Appearance: Normal appearance. She is not ill-appearing, toxic-appearing or diaphoretic.  HENT:     Head: Normocephalic.  Eyes:     Conjunctiva/sclera: Conjunctivae normal.  Pulmonary:     Effort: Pulmonary effort is  normal.  Musculoskeletal:     Right lower leg: No edema.     Left lower leg: No edema.     Comments: Full range of motion of the right hip.  She does have pain with palpation of the lateral hip and lower right lumbar paraspinal muscles.  Tender over the right iliac crest.  No signs of injury or trauma to the skin.  Normal sensation and strength of the bilateral lower extremities  Skin:    General: Skin is dry.  Neurological:     Mental Status: She is alert.     Sensory: No sensory deficit.     Deep Tendon Reflexes: Reflexes normal.  Psychiatric:        Mood and Affect:  Mood normal.     ED Results / Procedures / Treatments   Labs (all labs ordered are listed, but only abnormal results are displayed) Labs Reviewed - No data to display  EKG None  Radiology DG Lumbar Spine Complete  Result Date: 04/13/2019 CLINICAL DATA:  Slipped and fell down three steps this morning, RIGHT-sided low back pain radiating to RIGHT hip EXAM: LUMBAR SPINE - COMPLETE 4+ VIEW COMPARISON:  None FINDINGS: Osseous demineralization. 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 Five non-rib-bearing lumbar vertebra. Minimal disc space narrowing and anterolisthesis L4-L5 due to a combination of degenerative disc and facet disease. Vertebral body heights maintained without fracture or additional subluxation. No bone destruction or spondylolysis. SI joints preserved. Amorphous calcific density in the RIGHT upper quadrant, could represent radiodense medication, less likely milk of calcium within gallbladder. IMPRESSION: Degenerative disc and facet disease changes L4-L5 with minimal anterolisthesis. No acute osseous abnormalities. Amorphous calcific density RIGHT upper quadrant question radiodense medication in bowel versus milk of calcium within gallbladder. Electronically Signed   By: Ulyses Southward M.D.   On: 04/13/2019 10:25   DG Hip Unilat W or Wo Pelvis 2-3 Views Right  Result Date: 04/13/2019 CLINICAL DATA:  Slipped and fell down three  steps this morning, RIGHT-sided low back pain radiating to RIGHT hip EXAM: DG HIP (WITH OR WITHOUT PELVIS) 2-3V RIGHT COMPARISON:  None. FINDINGS: Osseous mineralization normal. Hip and SI joint spaces preserved No acute fracture, dislocation, or bone destruction. IMPRESSION: No acute osseous abnormalities. Electronically Signed   By: Ulyses Southward M.D.   On: 04/13/2019 10:27    Procedures Procedures (including critical care time)  Medications Ordered in ED Medications  cyclobenzaprine (FLEXERIL) tablet 5 mg (5 mg Oral Given 04/13/19 0943)  naproxen (NAPROSYN) tablet 500 mg (500 mg Oral Given 04/13/19 1740)    ED Course  I have reviewed the triage vital signs and the nursing notes.  Pertinent labs & imaging results that were available during my care of the patient were reviewed by me and considered in my medical decision making (see chart for details).  Clinical Course as of Apr 13 1035  Mon Apr 13, 2019  1034 Patient with mechanical fall onto the right hip this morning.  No loss of consciousness.  Able to ambulate.  No acute bony findings on x-rays.  Improved with cyclobenzaprine and naproxen.  Advised on rest, ice, elevation and conservative treatments.  Advised on return precautions.  Patient discharged in stable condition.   [KM]    Clinical Course User Index [KM] Jeral Pinch   MDM Rules/Calculators/A&P                      Based on review of vitals, medical screening exam, lab work and/or imaging, there does not appear to be an acute, emergent etiology for the patient's symptoms. Counseled pt on good return precautions and encouraged both PCP and ED follow-up as needed.  Prior to discharge, I also discussed incidental imaging findings with patient in detail and advised appropriate, recommended follow-up in detail.  Clinical Impression: 1. Fall, initial encounter   2. Contusion of right hip, initial encounter     Disposition: Discharge  Prior to providing a prescription  for a controlled substance, I independently reviewed the patient's recent prescription history on the [place] Controlled Substance Reporting System. The patient had no recent or regular prescriptions and was deemed appropriate for a brief, less than 3 day prescription of narcotic for acute analgesia.  This  note was prepared with assistance of Conservation officer, historic buildings. Occasional wrong-word or sound-a-like substitutions may have occurred due to the inherent limitations of voice recognition software.  Final Clinical Impression(s) / ED Diagnoses Final diagnoses:  Fall, initial encounter  Contusion of right hip, initial encounter    Rx / DC Orders ED Discharge Orders         Ordered    cyclobenzaprine (FLEXERIL) 10 MG tablet  2 times daily PRN     04/13/19 1036    naproxen (NAPROSYN) 500 MG tablet  2 times daily     04/13/19 [address] 04/13/19 1037    Arby Barrette, MD 04/20/19 1426

## 2019-04-16 ENCOUNTER — Telehealth: Payer: Self-pay | Admitting: Internal Medicine

## 2019-04-16 NOTE — Telephone Encounter (Signed)
Pt called and said she went to [facility] ER b/c she took a bad fall on Monday. She has a huge bruise and having muscle spasms. Pt is in pain and wants to know if an x-ray would show maybe a hairline fracture or do Dr. Lorin Picket thinks she needs to be referred somewhere?

## 2019-04-16 NOTE — Telephone Encounter (Signed)
Pt aware.

## 2019-04-16 NOTE — Telephone Encounter (Signed)
Agree.  If fall and increased pain - [facility].

## 2019-04-16 NOTE — Telephone Encounter (Signed)
Pt had fall on Monday. Was evaluated in ED. X-rays ok. Patient is still having severe lower back and right hip pain/muscle spasms. She has been using heat and ice. Was given naproxen and flexeril. Minimal relief. If she is still she is ok. Advised if she is in this much pain she should be re-evaluated. Gave her the info for [facility] for the acute pain. Advised that she may need referral to ortho as well. Pt does not have a preference who she sees. She has an appt with you on 2/9.

## 2019-04-21 ENCOUNTER — Other Ambulatory Visit: Payer: Self-pay

## 2019-04-21 ENCOUNTER — Ambulatory Visit (INDEPENDENT_AMBULATORY_CARE_PROVIDER_SITE_OTHER): Payer: Commercial Managed Care - PPO | Admitting: Internal Medicine

## 2019-04-21 DIAGNOSIS — M25551 Pain in right hip: Secondary | ICD-10-CM

## 2019-04-21 DIAGNOSIS — K219 Gastro-esophageal reflux disease without esophagitis: Secondary | ICD-10-CM

## 2019-04-21 DIAGNOSIS — E7800 Pure hypercholesterolemia, unspecified: Secondary | ICD-10-CM

## 2019-04-21 DIAGNOSIS — D649 Anemia, unspecified: Secondary | ICD-10-CM | POA: Diagnosis not present

## 2019-04-21 DIAGNOSIS — F439 Reaction to severe stress, unspecified: Secondary | ICD-10-CM

## 2019-04-21 DIAGNOSIS — Z9109 Other allergy status, other than to drugs and biological substances: Secondary | ICD-10-CM | POA: Diagnosis not present

## 2019-04-21 DIAGNOSIS — E039 Hypothyroidism, unspecified: Secondary | ICD-10-CM

## 2019-04-21 DIAGNOSIS — K760 Fatty (change of) liver, not elsewhere classified: Secondary | ICD-10-CM | POA: Diagnosis not present

## 2019-04-21 DIAGNOSIS — J4520 Mild intermittent asthma, uncomplicated: Secondary | ICD-10-CM

## 2019-04-21 DIAGNOSIS — G4733 Obstructive sleep apnea (adult) (pediatric): Secondary | ICD-10-CM

## 2019-04-21 NOTE — Progress Notes (Signed)
Patient ID: Bethany Cook, female   DOB: 04/18/1965, 54 y.o.   MRN: [id number]   Virtual Visit via video Note  This visit type was conducted due to national recommendations for restrictions regarding the COVID-19 pandemic (e.g. social distancing).  This format is felt to be most appropriate for this patient at this time.  All issues noted in this document were discussed and addressed.  No physical exam was performed (except for noted visual exam findings with Video Visits).   I connected with Myna Bright by a video enabled telemedicine application and verified that I am speaking with the correct person using two identifiers. Location patient: home Location provider: work  Persons participating in the virtual visit: patient, provider  The limitations, risks, security and privacy concerns of performing an evaluation and management service by video and the availability of in person appointments have been discussed. The patient expressed understanding and agreed to proceed.   Reason for visit: scheduled follow up.   HPI: Recently slipped on icy surface and fell down two steps.  Landed on her right hip.  Denies head injury.  No LOC.  Evaluated in ER.  No acute bony abnormality noted on xray.  Treated with naprosyn and flexeril.  Was evaluated at [facility].  Given prednisone taper.  Continued MR.  Is better. Low back tight.  Pain around to front.  No pain down leg.  No bladder or bowel change.  Breathing has been doing well.  No increased cough or congestion.  No chest pain reported.  No abdominal pain or bowel change reported.     ROS: See pertinent positives and negatives per HPI.  Past Medical History:  Diagnosis Date  . Anemia    iron deficient  . Asthma   . Change in bowel movement 12/21/2016  . Cholelithiasis 10/27/2016  . Dizziness 12/26/2014  . Environmental allergies   . Family history of colon cancer 04/25/2013   Colonoscopy 11/18/14 - diverticulosis (sigmoid - mild).  No colorectal  neoplasia.  Recommend f/u colonoscopy in five years.    . Fatty liver 10/27/2016  . GERD (gastroesophageal reflux disease) 04/25/2013  . Health care maintenance 04/23/2014  . Hypercholesterolemia   . Hyperthyroidism    s/p ablation  . Hypothyroidism 01/27/2012  . Lung nodule 10/27/2016  . Viral upper respiratory illness 05/30/2014    Past Surgical History:  Procedure Laterality Date  . NO PAST SURGERIES      Family History  Problem Relation Age of Onset  . Colon cancer Father   . Colon cancer Paternal Uncle        x4  . Asthma Other        cousine  . Breast cancer Neg Hx     SOCIAL HX: reviewed.    Current Outpatient Medications:  .  albuterol (PROVENTIL HFA;VENTOLIN HFA) 108 (90 Base) MCG/ACT inhaler, Inhale 1 puff into the lungs every 6 (six) hours as needed for wheezing or shortness of breath., Disp: 21 Inhaler, Rfl: 3 .  albuterol (PROVENTIL) (2.5 MG/3ML) 0.083% nebulizer solution, Take 3 mLs (2.5 mg total) by nebulization every 6 (six) hours as needed for wheezing or shortness of breath., Disp: 150 mL, Rfl: 1 .  budesonide-formoterol (SYMBICORT) 160-4.5 MCG/ACT inhaler, Inhale 2 puffs into the lungs 2 (two) times daily., Disp: , Rfl:  .  escitalopram (LEXAPRO) 20 MG tablet, TAKE 1 TABLET BY MOUTH  DAILY, Disp: 90 tablet, Rfl: 3 .  levocetirizine (XYZAL) 5 MG tablet, Take 5 mg by mouth every evening.,  Disp: , Rfl:  .  levothyroxine (SYNTHROID) 112 MCG tablet, TAKE 1 TABLET BY MOUTH  DAILY, Disp: 90 tablet, Rfl: 3 .  montelukast (SINGULAIR) 10 MG tablet, TAKE 1 TABLET BY MOUTH AT  BEDTIME, Disp: 90 tablet, Rfl: 3 .  naproxen (NAPROSYN) 500 MG tablet, Take 1 tablet (500 mg total) by mouth 2 (two) times daily., Disp: 30 tablet, Rfl: 0 .  omeprazole (PRILOSEC) 40 MG capsule, TAKE 1 CAPSULE BY MOUTH  DAILY, Disp: 90 capsule, Rfl: 3 .  pravastatin (PRAVACHOL) 10 MG tablet, TAKE 1 TABLET BY MOUTH  EVERY NIGHT, Disp: 90 tablet, Rfl: 3 .  zolpidem (AMBIEN) 10 MG tablet, Take 1 tablet  by mouth at bedtime as needed., Disp: , Rfl:   EXAM:  GENERAL: alert, oriented, appears well and in no acute distress  HEENT: atraumatic, conjunttiva clear, no obvious abnormalities on inspection of external nose and ears  NECK: normal movements of the head and neck  LUNGS: on inspection no signs of respiratory distress, breathing rate appears normal, no obvious gross SOB, gasping or wheezing  CV: no obvious cyanosis  PSYCH/NEURO: pleasant and cooperative, no obvious depression or anxiety, speech and thought processing grossly intact  ASSESSMENT AND PLAN:  Discussed the following assessment and plan:  Anemia Follow cbc.   Asthma Breathing stable. Doing well on current medication regimen.    Environmental allergies Stable on current regimen.    Fatty liver Low carb diet and exercise.  Follow liver function tests.    GERD (gastroesophageal reflux disease) Acid reflux controlled.    Hypercholesterolemia On pravastatin.  Low cholesterol diet and exercise.  Follow lipid panel and liver function tests.   Hypothyroidism On thyroid replacement.  Follow tsh.   Stress On lexapro.  Doing well.  Follow.    OSA (obstructive sleep apnea) Continue cpap.   Right hip pain Right hip pain s/p fall.  Xray as outlined. Seeing [facility].  Continue f/u with ortho.    Orders Placed This Encounter  Procedures  . CBC with Differential/Platelet    Standing Status:   Future    Standing Expiration Date:   04/25/2020  . Hepatic function panel    Standing Status:   Future    Standing Expiration Date:   04/25/2020  . Lipid panel    Standing Status:   Future    Standing Expiration Date:   04/25/2020  . TSH    Standing Status:   Future    Standing Expiration Date:   04/25/2020  . Basic metabolic panel    Standing Status:   Future    Standing Expiration Date:   04/25/2020     I discussed the assessment and treatment plan with the patient. The patient was provided an opportunity to ask  questions and all were answered. The patient agreed with the plan and demonstrated an understanding of the instructions.   The patient was advised to call back or seek an in-person evaluation if the symptoms worsen or if the condition fails to improve as anticipated.   Einar Pheasant, MD

## 2019-04-26 ENCOUNTER — Encounter: Payer: Self-pay | Admitting: Internal Medicine

## 2019-04-26 DIAGNOSIS — G4733 Obstructive sleep apnea (adult) (pediatric): Secondary | ICD-10-CM | POA: Insufficient documentation

## 2019-04-26 DIAGNOSIS — M25551 Pain in right hip: Secondary | ICD-10-CM | POA: Insufficient documentation

## 2019-04-26 NOTE — Assessment & Plan Note (Signed)
Low carb diet and exercise.  Follow liver function tests.   

## 2019-04-26 NOTE — Assessment & Plan Note (Signed)
Breathing stable. Doing well on current medication regimen.

## 2019-04-26 NOTE — Assessment & Plan Note (Signed)
Right hip pain s/p fall.  Xray as outlined. Seeing [facility].  Continue f/u with ortho.

## 2019-04-26 NOTE — Assessment & Plan Note (Signed)
Follow cbc.  

## 2019-04-26 NOTE — Assessment & Plan Note (Signed)
Continue cpap.  

## 2019-04-26 NOTE — Assessment & Plan Note (Signed)
On pravastatin.  Low cholesterol diet and exercise.  Follow lipid panel and liver function tests.   

## 2019-04-26 NOTE — Assessment & Plan Note (Signed)
On thyroid replacement.  Follow tsh.  

## 2019-04-26 NOTE — Assessment & Plan Note (Signed)
Stable on current regimen   

## 2019-04-26 NOTE — Assessment & Plan Note (Signed)
On lexapro.  Doing well.  Follow.   

## 2019-04-26 NOTE — Assessment & Plan Note (Signed)
Acid reflux controlled.   

## 2019-05-18 ENCOUNTER — Encounter: Payer: Self-pay | Admitting: Internal Medicine

## 2019-05-28 ENCOUNTER — Other Ambulatory Visit (INDEPENDENT_AMBULATORY_CARE_PROVIDER_SITE_OTHER): Payer: Commercial Managed Care - PPO

## 2019-05-28 DIAGNOSIS — K760 Fatty (change of) liver, not elsewhere classified: Secondary | ICD-10-CM | POA: Diagnosis not present

## 2019-05-28 DIAGNOSIS — D649 Anemia, unspecified: Secondary | ICD-10-CM | POA: Diagnosis not present

## 2019-05-28 DIAGNOSIS — E7800 Pure hypercholesterolemia, unspecified: Secondary | ICD-10-CM | POA: Diagnosis not present

## 2019-05-28 DIAGNOSIS — E039 Hypothyroidism, unspecified: Secondary | ICD-10-CM

## 2019-05-28 LAB — CBC WITH DIFFERENTIAL/PLATELET
Basophils Absolute: 0.1 10*3/uL (ref 0.0–0.1)
Basophils Relative: 0.9 % (ref 0.0–3.0)
Eosinophils Absolute: 0.2 10*3/uL (ref 0.0–0.7)
Eosinophils Relative: 1.4 % (ref 0.0–5.0)
HCT: 39.8 % (ref 36.0–46.0)
Hemoglobin: 13.2 g/dL (ref 12.0–15.0)
Lymphocytes Relative: 26.2 % (ref 12.0–46.0)
Lymphs Abs: 3 10*3/uL (ref 0.7–4.0)
MCHC: 33.2 g/dL (ref 30.0–36.0)
MCV: 85.2 fl (ref 78.0–100.0)
Monocytes Absolute: 0.8 10*3/uL (ref 0.1–1.0)
Monocytes Relative: 7.1 % (ref 3.0–12.0)
Neutro Abs: 7.3 10*3/uL (ref 1.4–7.7)
Neutrophils Relative %: 64.4 % (ref 43.0–77.0)
Platelets: 328 10*3/uL (ref 150.0–400.0)
RBC: 4.67 Mil/uL (ref 3.87–5.11)
RDW: 15.2 % (ref 11.5–15.5)
WBC: 11.3 10*3/uL — ABNORMAL HIGH (ref 4.0–10.5)

## 2019-05-28 LAB — BASIC METABOLIC PANEL
BUN: 15 mg/dL (ref 6–23)
CO2: 26 mEq/L (ref 19–32)
Calcium: 9.1 mg/dL (ref 8.4–10.5)
Chloride: 103 mEq/L (ref 96–112)
Creatinine, Ser: 0.82 mg/dL (ref 0.40–1.20)
GFR: 72.16 mL/min (ref >60.00)
Glucose, Bld: 102 mg/dL — ABNORMAL HIGH (ref 70–99)
Potassium: 3.8 mEq/L (ref 3.5–5.1)
Sodium: 137 mEq/L (ref 135–145)

## 2019-05-28 LAB — LIPID PANEL
Cholesterol: 199 mg/dL (ref 0–200)
HDL: 65.2 mg/dL (ref >39.00)
LDL Cholesterol: 114 mg/dL — ABNORMAL HIGH (ref 0–99)
NonHDL: 133.62
Total CHOL/HDL Ratio: 3
Triglycerides: 96 mg/dL (ref 0.0–149.0)
VLDL: 19.2 mg/dL (ref 0.0–40.0)

## 2019-05-28 LAB — HEPATIC FUNCTION PANEL
ALT: 22 U/L (ref 0–35)
AST: 14 U/L (ref 0–37)
Albumin: 4.1 g/dL (ref 3.5–5.2)
Alkaline Phosphatase: 93 U/L (ref 39–117)
Bilirubin, Direct: 0 mg/dL (ref 0.0–0.3)
Total Bilirubin: 0.4 mg/dL (ref 0.2–1.2)
Total Protein: 7.5 g/dL (ref 6.0–8.3)

## 2019-05-28 LAB — TSH: TSH: 0.76 u[IU]/mL (ref 0.35–4.50)

## 2019-05-29 ENCOUNTER — Telehealth: Payer: Self-pay | Admitting: Internal Medicine

## 2019-05-29 ENCOUNTER — Other Ambulatory Visit: Payer: Self-pay | Admitting: Internal Medicine

## 2019-05-29 DIAGNOSIS — D72829 Elevated white blood cell count, unspecified: Secondary | ICD-10-CM

## 2019-05-29 NOTE — Progress Notes (Signed)
Order placed for f/u lab.   

## 2019-05-29 NOTE — Progress Notes (Signed)
F/u cbc ordered.

## 2019-05-29 NOTE — Telephone Encounter (Signed)
Noted. This is just an [place]. I had asked her this morning when reviewing results if she had been on any steroids. Confirmed nothing acute going on with the redness on her neck

## 2019-05-29 NOTE — Telephone Encounter (Signed)
Pt called and wanted you to know that she had a Cortizone shot in right shoulder. Also she has redness on the back of her neck. She think it is caused by painting and staining. She said if you have any other questions to just give her a call back.

## 2019-06-01 ENCOUNTER — Encounter: Payer: Self-pay | Admitting: Internal Medicine

## 2019-06-26 ENCOUNTER — Other Ambulatory Visit (INDEPENDENT_AMBULATORY_CARE_PROVIDER_SITE_OTHER): Payer: Commercial Managed Care - PPO

## 2019-06-26 DIAGNOSIS — D72829 Elevated white blood cell count, unspecified: Secondary | ICD-10-CM | POA: Diagnosis not present

## 2019-06-26 LAB — CBC WITH DIFFERENTIAL/PLATELET
Basophils Absolute: 0.1 10*3/uL (ref 0.0–0.1)
Basophils Relative: 1.1 % (ref 0.0–3.0)
Eosinophils Absolute: 0.2 10*3/uL (ref 0.0–0.7)
Eosinophils Relative: 2.2 % (ref 0.0–5.0)
HCT: 38.2 % (ref 36.0–46.0)
Hemoglobin: 12.8 g/dL (ref 12.0–15.0)
Lymphocytes Relative: 24.7 % (ref 12.0–46.0)
Lymphs Abs: 2.2 10*3/uL (ref 0.7–4.0)
MCHC: 33.6 g/dL (ref 30.0–36.0)
MCV: 84.9 fl (ref 78.0–100.0)
Monocytes Absolute: 0.7 10*3/uL (ref 0.1–1.0)
Monocytes Relative: 7.5 % (ref 3.0–12.0)
Neutro Abs: 5.8 10*3/uL (ref 1.4–7.7)
Neutrophils Relative %: 64.5 % (ref 43.0–77.0)
Platelets: 300 10*3/uL (ref 150.0–400.0)
RBC: 4.5 Mil/uL (ref 3.87–5.11)
RDW: 15.4 % (ref 11.5–15.5)
WBC: 9 10*3/uL (ref 4.0–10.5)

## 2019-06-29 ENCOUNTER — Encounter: Payer: Self-pay | Admitting: Internal Medicine

## 2019-07-21 ENCOUNTER — Encounter: Payer: Self-pay | Admitting: Internal Medicine

## 2019-07-21 ENCOUNTER — Telehealth (INDEPENDENT_AMBULATORY_CARE_PROVIDER_SITE_OTHER): Payer: Commercial Managed Care - PPO | Admitting: Internal Medicine

## 2019-07-21 DIAGNOSIS — K219 Gastro-esophageal reflux disease without esophagitis: Secondary | ICD-10-CM

## 2019-07-21 DIAGNOSIS — J4520 Mild intermittent asthma, uncomplicated: Secondary | ICD-10-CM

## 2019-07-21 DIAGNOSIS — G4733 Obstructive sleep apnea (adult) (pediatric): Secondary | ICD-10-CM

## 2019-07-21 DIAGNOSIS — E039 Hypothyroidism, unspecified: Secondary | ICD-10-CM | POA: Diagnosis not present

## 2019-07-21 DIAGNOSIS — F439 Reaction to severe stress, unspecified: Secondary | ICD-10-CM | POA: Diagnosis not present

## 2019-07-21 DIAGNOSIS — E7800 Pure hypercholesterolemia, unspecified: Secondary | ICD-10-CM

## 2019-07-21 DIAGNOSIS — Z9109 Other allergy status, other than to drugs and biological substances: Secondary | ICD-10-CM

## 2019-07-21 DIAGNOSIS — R911 Solitary pulmonary nodule: Secondary | ICD-10-CM

## 2019-07-21 DIAGNOSIS — K760 Fatty (change of) liver, not elsewhere classified: Secondary | ICD-10-CM

## 2019-07-21 DIAGNOSIS — R42 Dizziness and giddiness: Secondary | ICD-10-CM

## 2019-07-21 NOTE — Progress Notes (Signed)
Patient ID: Bethany Cook, female   DOB: 01/28/1966, 54 y.o.   MRN: [id number]   Virtual Visit via videoNote  This visit type was conducted due to national recommendations for restrictions regarding the COVID-19 pandemic (e.g. social distancing).  This format is felt to be most appropriate for this patient at this time.  All issues noted in this document were discussed and addressed.  No physical exam was performed (except for noted visual exam findings with Video Visits).   I connected with Myna Bright by a video enabled telemedicine application and verified that I am speaking with the correct person using two identifiers. Location patient: home Location provider: work  Persons participating in the virtual visit: patient, provider  The limitations, risks, security and privacy concerns of performing an evaluation and management service by telephone and the availability of in person appointments have been discussed.   It has also been discussed with the patient that there may be a patient responsible charge related to this service. The patient has expressed understanding and has agreed to proceed.   Reason for visit: scheduled follow up.    HPI: She reports she is doing relatively well.  Just returned from [place].  Working.  Handling stress.  Reports no chest pain.  Breathing stable.  No acid reflux reported.  No abdominal pain.  Bowels stable.  Did jump up quick and noticed room spinning - yesterday.  Some associated nausea.  No headache. Has history of vertigo.  Feels similar to previous episode.  Feeling better today.  No significant dizziness today.  Taking omeprazole.  Helps acid reflux.  Receiving allergy shots.  Allergies/sinus - ok.  Some fatigue, but overall feels she has been doing relatively well.  Handling stress.    ROS: See pertinent positives and negatives per HPI.  Past Medical History:  Diagnosis Date  . Anemia    iron deficient  . Asthma   . Change in bowel  movement 12/21/2016  . Cholelithiasis 10/27/2016  . Dizziness 12/26/2014  . Environmental allergies   . Family history of colon cancer 04/25/2013   Colonoscopy 11/18/14 - diverticulosis (sigmoid - mild).  No colorectal neoplasia.  Recommend f/u colonoscopy in five years.    . Fatty liver 10/27/2016  . GERD (gastroesophageal reflux disease) 04/25/2013  . Health care maintenance 04/23/2014  . Hypercholesterolemia   . Hyperthyroidism    s/p ablation  . Hypothyroidism 01/27/2012  . Lung nodule 10/27/2016  . Viral upper respiratory illness 05/30/2014    Past Surgical History:  Procedure Laterality Date  . NO PAST SURGERIES      Family History  Problem Relation Age of Onset  . Colon cancer Father   . Colon cancer Paternal Uncle        x4  . Asthma Other        cousine  . Breast cancer Neg Hx     SOCIAL HX: reviewed.    Current Outpatient Medications:  .  albuterol (PROVENTIL HFA;VENTOLIN HFA) 108 (90 Base) MCG/ACT inhaler, Inhale 1 puff into the lungs every 6 (six) hours as needed for wheezing or shortness of breath., Disp: 21 Inhaler, Rfl: 3 .  albuterol (PROVENTIL) (2.5 MG/3ML) 0.083% nebulizer solution, Take 3 mLs (2.5 mg total) by nebulization every 6 (six) hours as needed for wheezing or shortness of breath., Disp: 150 mL, Rfl: 1 .  ALPRAZolam (XANAX) 0.25 MG tablet, Take 0.25 mg by mouth every 8 (eight) hours as needed., Disp: , Rfl:  .  budesonide-formoterol (SYMBICORT)  160-4.5 MCG/ACT inhaler, Inhale 2 puffs into the lungs 2 (two) times daily., Disp: , Rfl:  .  escitalopram (LEXAPRO) 20 MG tablet, TAKE 1 TABLET BY MOUTH  DAILY, Disp: 90 tablet, Rfl: 3 .  levocetirizine (XYZAL) 5 MG tablet, Take 5 mg by mouth every evening., Disp: , Rfl:  .  levothyroxine (SYNTHROID) 112 MCG tablet, TAKE 1 TABLET BY MOUTH  DAILY, Disp: 90 tablet, Rfl: 3 .  montelukast (SINGULAIR) 10 MG tablet, TAKE 1 TABLET BY MOUTH AT  BEDTIME, Disp: 90 tablet, Rfl: 3 .  omeprazole (PRILOSEC) 40 MG capsule, TAKE  1 CAPSULE BY MOUTH  DAILY, Disp: 90 capsule, Rfl: 3 .  pravastatin (PRAVACHOL) 10 MG tablet, TAKE 1 TABLET BY MOUTH  EVERY NIGHT, Disp: 90 tablet, Rfl: 3 .  zolpidem (AMBIEN) 10 MG tablet, Take 1 tablet by mouth at bedtime as needed., Disp: , Rfl:   EXAM:  GENERAL: alert, oriented, appears well and in no acute distress  HEENT: atraumatic, conjunttiva clear, no obvious abnormalities on inspection of external nose and ears  NECK: normal movements of the head and neck  LUNGS: on inspection no signs of respiratory distress, breathing rate appears normal, no obvious gross SOB, gasping or wheezing  CV: no obvious cyanosis  PSYCH/NEURO: pleasant and cooperative, no obvious depression or anxiety, speech and thought processing grossly intact  ASSESSMENT AND PLAN:  Discussed the following assessment and plan:  Stress On lexapro.  Stable.  Follow.    OSA (obstructive sleep apnea) Continue cpap.   Lung nodule Resolution.  Saw Dr Genevive Bi in follow.  No further w/up - no nodules.  Hypothyroidism On synthroid.  Follow tsh.   Hypercholesterolemia On pravastatin.  Low cholesterol diet and exercise.  Follow lipid panel and liver function tests.    GERD (gastroesophageal reflux disease) On omeprazole.  No upper symptoms reported.    Fatty liver Low carb diet and exercise.  Follow liver function tests.    Environmental allergies Receiving allergy shots. Symptoms controlled.    Asthma Continue symbicort.  Has albuterol prn.  Breathing stable.  Follow.    Dizziness Had what sounds like vertigo.  Feeling better today.  Has had vertigo previously.  Feels similar.  Discussed further evaluation, including ENT referral.  She feels better and wants to monitor.  Follow.     Orders Placed This Encounter  Procedures  . Hepatic function panel    Standing Status:   Future    Standing Expiration Date:   07/25/2020  . Lipid panel    Standing Status:   Future    Standing Expiration Date:    07/25/2020  . Basic metabolic panel    Standing Status:   Future    Standing Expiration Date:   07/25/2020     I discussed the assessment and treatment plan with the patient. The patient was provided an opportunity to ask questions and all were answered. The patient agreed with the plan and demonstrated an understanding of the instructions.   The patient was advised to call back or seek an in-person evaluation if the symptoms worsen or if the condition fails to improve as anticipated.    Einar Pheasant, MD

## 2019-07-23 ENCOUNTER — Encounter (HOSPITAL_COMMUNITY): Payer: Self-pay

## 2019-07-23 ENCOUNTER — Other Ambulatory Visit: Payer: Self-pay

## 2019-07-23 ENCOUNTER — Ambulatory Visit (HOSPITAL_COMMUNITY)
Admission: EM | Admit: 2019-07-23 | Discharge: 2019-07-23 | Disposition: A | Discharge status: Home / Self Care | Payer: Commercial Managed Care - PPO | Attending: Family | Admitting: Family

## 2019-07-23 DIAGNOSIS — R040 Epistaxis: Secondary | ICD-10-CM

## 2019-07-23 DIAGNOSIS — Z20822 Contact with and (suspected) exposure to covid-19: Secondary | ICD-10-CM | POA: Diagnosis not present

## 2019-07-23 DIAGNOSIS — E039 Hypothyroidism, unspecified: Secondary | ICD-10-CM | POA: Insufficient documentation

## 2019-07-23 DIAGNOSIS — R519 Headache, unspecified: Secondary | ICD-10-CM

## 2019-07-23 DIAGNOSIS — G4733 Obstructive sleep apnea (adult) (pediatric): Secondary | ICD-10-CM | POA: Diagnosis not present

## 2019-07-23 DIAGNOSIS — D649 Anemia, unspecified: Secondary | ICD-10-CM | POA: Insufficient documentation

## 2019-07-23 DIAGNOSIS — J45909 Unspecified asthma, uncomplicated: Secondary | ICD-10-CM | POA: Insufficient documentation

## 2019-07-23 DIAGNOSIS — G4700 Insomnia, unspecified: Secondary | ICD-10-CM | POA: Insufficient documentation

## 2019-07-23 DIAGNOSIS — K760 Fatty (change of) liver, not elsewhere classified: Secondary | ICD-10-CM | POA: Insufficient documentation

## 2019-07-23 DIAGNOSIS — Z79899 Other long term (current) drug therapy: Secondary | ICD-10-CM | POA: Diagnosis not present

## 2019-07-23 DIAGNOSIS — E7800 Pure hypercholesterolemia, unspecified: Secondary | ICD-10-CM | POA: Insufficient documentation

## 2019-07-23 DIAGNOSIS — K219 Gastro-esophageal reflux disease without esophagitis: Secondary | ICD-10-CM | POA: Diagnosis not present

## 2019-07-23 DIAGNOSIS — R42 Dizziness and giddiness: Secondary | ICD-10-CM | POA: Insufficient documentation

## 2019-07-23 DIAGNOSIS — Z7951 Long term (current) use of inhaled steroids: Secondary | ICD-10-CM | POA: Diagnosis not present

## 2019-07-23 DIAGNOSIS — Z881 Allergy status to other antibiotic agents status: Secondary | ICD-10-CM | POA: Diagnosis not present

## 2019-07-23 DIAGNOSIS — E785 Hyperlipidemia, unspecified: Secondary | ICD-10-CM | POA: Diagnosis not present

## 2019-07-23 NOTE — Discharge Instructions (Addendum)
Recommend continue to monitor symptoms. May continue Aleve twice a day as needed for headache. May alternate or add Tylenol 1000mg  every 8 hours as needed. Rest. Follow-up pending COVID 19 test results and in 4 to 5 days with your PCP if symptoms continue. If headache worsens or nose bleeds occur more frequently, go to the ER ASAP. Otherwise follow-up with your PCP and possibly and ENT as planned.

## 2019-07-23 NOTE — ED Provider Notes (Signed)
[facility]    CSN: [id number] Arrival date & time: 07/23/19  1717      History   Chief Complaint Chief Complaint  Patient presents with  . HA, nosebleedx2    HPI Bethany Cook is a 54 y.o. female.   54 year old female presents with episode of vertigo that started 4 days ago. Minimal vertigo symptoms now but developed a frontal headache 2 days ago and now had 2 nose bleeds from her right nostril today. Also concerned about flushed face but no distinct fever or chills. Denies any nasal or chest congestion, cough or vision changes. Also denies any numbness or weakness. Has taken Aleve with some relief. Other chronic health issues include hyperlipidemia, thyroid disorder, environmental allergies, asthma, insomnia, GERD and anemia. Currently on Pravachol, Synthroid, Lexapro, Ambien, Prilosec, Singulair, Xyzal, and Symbicort daily with Albuterol and Xanax prn along with routine allergy shots.   The history is provided by the patient.    Past Medical History:  Diagnosis Date  . Anemia    iron deficient  . Asthma   . Change in bowel movement 12/21/2016  . Cholelithiasis 10/27/2016  . Dizziness 12/26/2014  . Environmental allergies   . Family history of colon cancer 04/25/2013   Colonoscopy 11/18/14 - diverticulosis (sigmoid - mild).  No colorectal neoplasia.  Recommend f/u colonoscopy in five years.    . Fatty liver 10/27/2016  . GERD (gastroesophageal reflux disease) 04/25/2013  . Health care maintenance 04/23/2014  . Hypercholesterolemia   . Hyperthyroidism    s/p ablation  . Hypothyroidism 01/27/2012  . Lung nodule 10/27/2016  . Viral upper respiratory illness 05/30/2014    Patient Active Problem List   Diagnosis Date Noted  . OSA (obstructive sleep apnea) 04/26/2019  . Right hip pain 04/26/2019  . Witnessed episode of apnea 04/06/2018  . Stress 12/17/2017  . Change in bowel movement 12/21/2016  . Cholelithiasis 10/27/2016  . Fatty liver 10/27/2016  . Lung nodule  10/27/2016  . Abdominal pain 04/27/2016  . Dizziness 12/26/2014  . Soft tissue swelling 12/26/2014  . Environmental allergies 04/23/2014  . Health care maintenance 04/23/2014  . Nasal drainage 04/01/2014  . GERD (gastroesophageal reflux disease) 04/25/2013  . Family history of colon cancer 04/25/2013  . Asthma 04/01/2012  . Anemia 04/01/2012  . Hypercholesterolemia 04/01/2012  . Hypothyroidism 01/27/2012    Past Surgical History:  Procedure Laterality Date  . NO PAST SURGERIES      OB History   No obstetric history on file.      Home Medications    Prior to Admission medications   Medication Sig Start Date End Date Taking? Authorizing Provider  albuterol (PROVENTIL HFA;VENTOLIN HFA) 108 (90 Base) MCG/ACT inhaler Inhale 1 puff into the lungs every 6 (six) hours as needed for wheezing or shortness of breath. 04/24/18   Dale Velarde, MD  albuterol (PROVENTIL) (2.5 MG/3ML) 0.083% nebulizer solution Take 3 mLs (2.5 mg total) by nebulization every 6 (six) hours as needed for wheezing or shortness of breath. 05/26/18   Dale Verdi, MD  ALPRAZolam Prudy Feeler) 0.25 MG tablet Take 0.25 mg by mouth every 8 (eight) hours as needed. 03/27/19   [provider]  budesonide-formoterol (SYMBICORT) 160-4.5 MCG/ACT inhaler Inhale 2 puffs into the lungs 2 (two) times daily.    [provider]  escitalopram (LEXAPRO) 20 MG tablet TAKE 1 TABLET BY MOUTH  DAILY 03/23/19   Dale , MD  levocetirizine (XYZAL) 5 MG tablet Take 5 mg by mouth every  evening.    [provider]  levothyroxine (SYNTHROID) 112 MCG tablet TAKE 1 TABLET BY MOUTH  DAILY 10/06/18   Dale Cass, MD  montelukast (SINGULAIR) 10 MG tablet TAKE 1 TABLET BY MOUTH AT  BEDTIME 12/05/18   Dale [place], MD  omeprazole (PRILOSEC) 40 MG capsule TAKE 1 CAPSULE BY MOUTH  DAILY 03/09/19   Dale Limestone, MD  pravastatin (PRAVACHOL) 10 MG tablet TAKE 1 TABLET BY MOUTH  EVERY NIGHT 10/28/18   Dale [place], MD  zolpidem (AMBIEN) 10 MG tablet Take 1 tablet by mouth at bedtime as needed. 03/17/18   [provider]    Family History Family History  Problem Relation Age of Onset  . Colon cancer Father   . Colon cancer Paternal Uncle        x4  . Asthma Other        cousine  . Breast cancer Neg Hx     Social History Social History   Tobacco Use  . Smoking status: Never Smoker  . Smokeless tobacco: Never Used  Substance Use Topics  . Alcohol use: Yes    Alcohol/week: 0.0 standard drinks    Comment: occasional  . Drug use: No     Allergies   Erythromycin   Review of Systems Review of Systems  Constitutional: Negative for activity change, appetite change, chills, diaphoresis, fatigue and fever (but face feels hot/flushed).  HENT: Positive for nosebleeds (right nostril only) and postnasal drip. Negative for congestion, ear discharge, ear pain, facial swelling, mouth sores, rhinorrhea, sinus pressure, sinus pain, sneezing, sore throat and trouble swallowing.   Eyes: Negative for photophobia, pain, discharge, redness, itching and visual disturbance.  Respiratory: Negative for cough, chest tightness, shortness of breath and wheezing.   Cardiovascular: Negative for chest pain.  Gastrointestinal: Negative for nausea and vomiting.  Musculoskeletal: Negative for arthralgias, myalgias, neck pain and neck stiffness.  Skin: Negative for color change, rash and wound.  Allergic/Immunologic: Positive for environmental allergies. Negative for immunocompromised state.  Neurological: Positive for dizziness (vertigo 4 days ago but resolved) and headaches. Negative for tremors, seizures, syncope, facial asymmetry, speech difficulty, weakness, light-headedness and numbness.  Hematological: Negative for adenopathy. Does not bruise/bleed easily.  Psychiatric/Behavioral: Positive for sleep disturbance.     Physical Exam Triage Vital Signs ED Triage Vitals  Enc Vitals Group     BP  07/23/19 1746 135/79     Pulse Rate 07/23/19 1746 80     Resp 07/23/19 1746 16     Temp 07/23/19 1746 98.3 F (36.8 C)     Temp Source 07/23/19 1746 Oral     SpO2 07/23/19 1746 97 %     Weight 07/23/19 1747 210 lb (95.3 kg)     Height 07/23/19 1747 5\' 3"  (1.6 m)     Head Circumference --      Peak Flow --      Pain Score 07/23/19 1747 4     Pain Loc --      Pain Edu? --      Excl. in GC? --    No data found.  Updated Vital Signs BP 135/79   Pulse 80   Temp 98.3 F (36.8 C) (Oral)   Resp 16   Ht 5\' 3"  (1.6 m)   Wt 210 lb (95.3 kg)   SpO2 97%   BMI 37.20 kg/m   Visual Acuity Right Eye Distance:   Left Eye Distance:   Bilateral Distance:    Right Eye Near:  Left Eye Near:    Bilateral Near:     Physical Exam Vitals and nursing note reviewed.  Constitutional:      General: She is awake. She is not in acute distress.    Appearance: She is well-developed and well-groomed. She is not ill-appearing.     Comments: She is sitting comfortably on the exam table in no acute distress.   HENT:     Head: Normocephalic and atraumatic.     Right Ear: Hearing, ear canal and external ear normal. A middle ear effusion is present. Tympanic membrane is bulging. Tympanic membrane is not injected or retracted.     Left Ear: Hearing, tympanic membrane, ear canal and external ear normal.  No middle ear effusion. Tympanic membrane is not injected, retracted or bulging.     Nose: Mucosal edema present.     Right Nostril: No foreign body, septal hematoma or occlusion.     Left Nostril: No foreign body, septal hematoma or occlusion.     Right Turbinates: Swollen.     Left Turbinates: Swollen.     Right Sinus: No maxillary sinus tenderness or frontal sinus tenderness.     Left Sinus: No maxillary sinus tenderness or frontal sinus tenderness.     Comments: Slightly swollen and irritated nasal passages but no active bleeding. No distinct polyps seen. No dried blood seen in anterior aspect of  right nostril.     Mouth/Throat:     Lips: Pink.     Mouth: Mucous membranes are moist.     Pharynx: Oropharynx is clear. Uvula midline. No pharyngeal swelling, oropharyngeal exudate, posterior oropharyngeal erythema or uvula swelling.  Eyes:     Extraocular Movements: Extraocular movements intact.     Conjunctiva/sclera: Conjunctivae normal.     Pupils: Pupils are equal, round, and reactive to light.  Neck:     Vascular: No carotid bruit.  Cardiovascular:     Rate and Rhythm: Normal rate and regular rhythm.     Pulses: Normal pulses.     Heart sounds: Normal heart sounds. No murmur.  Pulmonary:     Effort: Pulmonary effort is normal. No respiratory distress.     Breath sounds: Normal breath sounds and air entry. No decreased air movement. No decreased breath sounds, wheezing or rhonchi.  Musculoskeletal:        General: Normal range of motion.     Cervical back: Normal range of motion and neck supple. No rigidity or tenderness.  Lymphadenopathy:     Cervical: No cervical adenopathy.  Skin:    General: Skin is warm and dry.     Capillary Refill: Capillary refill takes less than 2 seconds.     Findings: No rash.  Neurological:     General: No focal deficit present.     Mental Status: She is alert and oriented to person, place, and time.     Cranial Nerves: Cranial nerves are intact.     Sensory: Sensation is intact. No sensory deficit.     Motor: Motor function is intact.     Coordination: Coordination is intact.     Gait: Gait is intact.  Psychiatric:        Mood and Affect: Mood normal.        Behavior: Behavior normal. Behavior is cooperative.        Thought Content: Thought content normal.        Judgment: Judgment normal.      UC Treatments / Results  Labs (all labs ordered  are listed, but only abnormal results are displayed) Labs Reviewed  SARS CORONAVIRUS 2 (TAT 6-24 HRS)    EKG   Radiology No results found.  Procedures Procedures (including critical  care time)  Medications Ordered in UC Medications - No data to display  Initial Impression / Assessment and Plan / UC Course  I have reviewed the triage vital signs and the nursing notes.  Pertinent labs & imaging results that were available during my care of the patient were reviewed by me and considered in my medical decision making (see chart for details).    Reviewed with patient that she may have a viral illness- obtained COVID 19 testing. Discussed that she has a small amount of fluid behind her right TM which may cause some dizziness and vertigo. Reviewed that recent environmental changes in variation in temperatures and pollen and bariatric pressure along with her underlying environmental allergies and asthma may be causing her headache and nose bleeds. Recommend continue to monitor. May continue OTC Aleve twice a day as directed for headache- but can increase chance of nose bleeds so monitor. May also alternate or add Tylenol 1000mg  every 8 hours as needed. Rest. May need to see her ENT. If headache worsens or nose bleeds continue and occur more frequently or any change in vision occurs, go to the ER ASAP. Otherwise, follow-up pending COVID 19 test results and with her PCP in 4 to 5 days if minimal improvement.  Final Clinical Impressions(s) / UC Diagnoses   Final diagnoses:  Frontal headache  Epistaxis  Vertigo     Discharge Instructions     Recommend continue to monitor symptoms. May continue Aleve twice a day as needed for headache. May alternate or add Tylenol 1000mg  every 8 hours as needed. Rest. Follow-up pending COVID 19 test results and in 4 to 5 days with your PCP if symptoms continue. If headache worsens or nose bleeds occur more frequently, go to the ER ASAP. Otherwise follow-up with your PCP and possibly and ENT as planned.     ED Prescriptions    None     PDMP not reviewed this encounter.   Katy Apo, NP 07/24/19 1138

## 2019-07-23 NOTE — ED Triage Notes (Signed)
Pt c/o 4/10 dull HAx2 days. Pt states nose bled twice today. Pt's nose is not currently bleeding. Pt states her face feels hot started today. Pt states she had vertigo Monday, but hasn't had it since. Pt denies N/V/D at present.

## 2019-07-24 LAB — SARS CORONAVIRUS 2 (TAT 6-24 HRS): SARS Coronavirus 2: NEGATIVE

## 2019-07-26 ENCOUNTER — Encounter: Payer: Self-pay | Admitting: Internal Medicine

## 2019-07-26 NOTE — Assessment & Plan Note (Signed)
Continue symbicort.  Has albuterol prn.  Breathing stable.  Follow.

## 2019-07-26 NOTE — Assessment & Plan Note (Signed)
Resolution.  Saw Dr Thelma Barge in follow.  No further w/up - no nodules.

## 2019-07-26 NOTE — Assessment & Plan Note (Signed)
On omeprazole.  No upper symptoms reported.   

## 2019-07-26 NOTE — Assessment & Plan Note (Signed)
Had what sounds like vertigo.  Feeling better today.  Has had vertigo previously.  Feels similar.  Discussed further evaluation, including ENT referral.  She feels better and wants to monitor.  Follow.

## 2019-07-26 NOTE — Assessment & Plan Note (Signed)
Low carb diet and exercise.  Follow liver function tests.   

## 2019-07-26 NOTE — Assessment & Plan Note (Signed)
On synthroid.  Follow tsh.   

## 2019-07-26 NOTE — Assessment & Plan Note (Signed)
Continue cpap.  

## 2019-07-26 NOTE — Assessment & Plan Note (Signed)
On pravastatin.  Low cholesterol diet and exercise.  Follow lipid panel and liver function tests.   

## 2019-07-26 NOTE — Assessment & Plan Note (Signed)
On lexapro.  Stable.  Follow.   

## 2019-07-26 NOTE — Assessment & Plan Note (Signed)
Receiving allergy shots. Symptoms controlled.

## 2019-08-25 ENCOUNTER — Ambulatory Visit: Payer: Commercial Managed Care - PPO | Admitting: Internal Medicine

## 2019-08-29 ENCOUNTER — Other Ambulatory Visit: Payer: Self-pay | Admitting: Internal Medicine

## 2019-09-11 ENCOUNTER — Other Ambulatory Visit: Payer: Self-pay | Admitting: Internal Medicine

## 2019-10-28 ENCOUNTER — Other Ambulatory Visit: Payer: Self-pay | Admitting: Internal Medicine

## 2019-11-18 ENCOUNTER — Other Ambulatory Visit: Payer: Self-pay | Admitting: Internal Medicine

## 2019-11-24 ENCOUNTER — Telehealth (INDEPENDENT_AMBULATORY_CARE_PROVIDER_SITE_OTHER): Payer: Commercial Managed Care - PPO | Admitting: Internal Medicine

## 2019-11-24 ENCOUNTER — Encounter: Payer: Self-pay | Admitting: Internal Medicine

## 2019-11-24 DIAGNOSIS — Z1231 Encounter for screening mammogram for malignant neoplasm of breast: Secondary | ICD-10-CM

## 2019-11-24 DIAGNOSIS — F439 Reaction to severe stress, unspecified: Secondary | ICD-10-CM | POA: Diagnosis not present

## 2019-11-24 DIAGNOSIS — G4733 Obstructive sleep apnea (adult) (pediatric): Secondary | ICD-10-CM | POA: Diagnosis not present

## 2019-11-24 DIAGNOSIS — D649 Anemia, unspecified: Secondary | ICD-10-CM

## 2019-11-24 DIAGNOSIS — K760 Fatty (change of) liver, not elsewhere classified: Secondary | ICD-10-CM

## 2019-11-24 DIAGNOSIS — Z9109 Other allergy status, other than to drugs and biological substances: Secondary | ICD-10-CM

## 2019-11-24 DIAGNOSIS — E039 Hypothyroidism, unspecified: Secondary | ICD-10-CM

## 2019-11-24 DIAGNOSIS — Z800 Family history of malignant neoplasm of digestive organs: Secondary | ICD-10-CM

## 2019-11-24 DIAGNOSIS — K219 Gastro-esophageal reflux disease without esophagitis: Secondary | ICD-10-CM

## 2019-11-24 DIAGNOSIS — E7800 Pure hypercholesterolemia, unspecified: Secondary | ICD-10-CM | POA: Diagnosis not present

## 2019-11-24 DIAGNOSIS — J4520 Mild intermittent asthma, uncomplicated: Secondary | ICD-10-CM

## 2019-11-24 DIAGNOSIS — M546 Pain in thoracic spine: Secondary | ICD-10-CM

## 2019-11-24 NOTE — Progress Notes (Signed)
Patient ID: DEDE DOBESH, female   DOB: 11/18/1965, 54 y.o.   MRN: [id number]   Virtual Visit via video Note  This visit type was conducted due to national recommendations for restrictions regarding the COVID-19 pandemic (e.g. social distancing).  This format is felt to be most appropriate for this patient at this time.  All issues noted in this document were discussed and addressed.  No physical exam was performed (except for noted visual exam findings with Video Visits).   I connected with Myna Bright by a video enabled telemedicine application and verified that I am speaking with the correct person using two identifiers. Location patient: home Location provider: work  Persons participating in the virtual visit: patient, provider  The limitations, risks, security and privacy concerns of performing an evaluation and management service by video and the availability of in person appointments have been discussed.  It has also been discussed with the patient that there may be a patient responsible charge related to this service. The patient expressed understanding and agreed to proceed.   Reason for visit: scheduled follow up.    HPI: She reports she is doing relatively well.  She has been trying to stay active.  Walking dog.  Working in yard.  Allergy shots have made a big difference to her.  Breathing doing well.  No increased cough or congestion.  No chest pain.  No acid reflux reported.  No abdominal pain.  Bowels moving.  She is having pain - right middle of back.  Present for approximately 2 months.  Described - burning/pin like sensation.  Ok in am - after lying down during the night.  No pain radiating down leg.     ROS: See pertinent positives and negatives per HPI.  Past Medical History:  Diagnosis Date  . Anemia    iron deficient  . Asthma   . Change in bowel movement 12/21/2016  . Cholelithiasis 10/27/2016  . Dizziness 12/26/2014  . Environmental allergies   . Family history  of colon cancer 04/25/2013   Colonoscopy 11/18/14 - diverticulosis (sigmoid - mild).  No colorectal neoplasia.  Recommend f/u colonoscopy in five years.    . Fatty liver 10/27/2016  . GERD (gastroesophageal reflux disease) 04/25/2013  . Health care maintenance 04/23/2014  . Hypercholesterolemia   . Hyperthyroidism    s/p ablation  . Hypothyroidism 01/27/2012  . Lung nodule 10/27/2016  . Viral upper respiratory illness 05/30/2014    Past Surgical History:  Procedure Laterality Date  . NO PAST SURGERIES      Family History  Problem Relation Age of Onset  . Colon cancer Father   . Colon cancer Paternal Uncle        x4  . Asthma Other        cousine  . Breast cancer Neg Hx     SOCIAL HX: reviewed.    Current Outpatient Medications:  .  albuterol (PROVENTIL HFA;VENTOLIN HFA) 108 (90 Base) MCG/ACT inhaler, Inhale 1 puff into the lungs every 6 (six) hours as needed for wheezing or shortness of breath., Disp: 21 Inhaler, Rfl: 3 .  albuterol (PROVENTIL) (2.5 MG/3ML) 0.083% nebulizer solution, Take 3 mLs (2.5 mg total) by nebulization every 6 (six) hours as needed for wheezing or shortness of breath., Disp: 150 mL, Rfl: 1 .  ALPRAZolam (XANAX) 0.25 MG tablet, Take 0.25 mg by mouth every 8 (eight) hours as needed., Disp: , Rfl:  .  budesonide-formoterol (SYMBICORT) 160-4.5 MCG/ACT inhaler, Inhale 2 puffs into the lungs  2 (two) times daily., Disp: , Rfl:  .  EPINEPHrine 0.3 mg/0.3 mL IJ SOAJ injection, Inject into the muscle as directed., Disp: , Rfl:  .  escitalopram (LEXAPRO) 20 MG tablet, TAKE 1 TABLET BY MOUTH  DAILY, Disp: 90 tablet, Rfl: 3 .  levocetirizine (XYZAL) 5 MG tablet, Take 5 mg by mouth every evening., Disp: , Rfl:  .  levothyroxine (SYNTHROID) 112 MCG tablet, TAKE 1 TABLET BY MOUTH  DAILY, Disp: 90 tablet, Rfl: 3 .  montelukast (SINGULAIR) 10 MG tablet, TAKE 1 TABLET BY MOUTH AT  BEDTIME, Disp: 90 tablet, Rfl: 3 .  omeprazole (PRILOSEC) 40 MG capsule, TAKE 1 CAPSULE BY MOUTH   DAILY, Disp: 90 capsule, Rfl: 3 .  pravastatin (PRAVACHOL) 10 MG tablet, TAKE 1 TABLET BY MOUTH AT  NIGHT, Disp: 90 tablet, Rfl: 3 .  zolpidem (AMBIEN) 10 MG tablet, Take 1 tablet by mouth at bedtime as needed., Disp: , Rfl:   EXAM:  GENERAL: alert, oriented, appears well and in no acute distress  HEENT: atraumatic, conjunttiva clear, no obvious abnormalities on inspection of external nose and ears  NECK: normal movements of the head and neck  LUNGS: on inspection no signs of respiratory distress, breathing rate appears normal, no obvious gross SOB, gasping or wheezing  CV: no obvious cyanosis  PSYCH/NEURO: pleasant and cooperative, no obvious depression or anxiety, speech and thought processing grossly intact  ASSESSMENT AND PLAN:  Discussed the following assessment and plan:  Stress On lexapro.  Stable. f ollow.    OSA (obstructive sleep apnea) Continue cpap.   Hypothyroidism On thyroid medication.  Follow tsh.    Hypercholesterolemia On pravastatin.  Low cholesterol diet and exercise.  Follow lipid panel and liver function tests.    GERD (gastroesophageal reflux disease) No upper symptoms reported.  On prilosec.    Fatty liver Low carb diet and exercise.  Follow liver function tests.    Family history of colon cancer Colonoscopy 11/2014.  Recommended f/u colonoscopy in 5 years.   Environmental allergies Receiving allergy shots.  Doing well.  Breathing doing well.  Follow.    Asthma Continue symbicort.  Doing well on current regimen.  Follow.    Anemia Follow cbc.   Back pain Back pain as outlined.  Persistent.  Check thoracic xray.    Breast cancer screening Gets mammogram at [facility].  Has appt 01/2020.     Orders Placed This Encounter  Procedures  . DG Thoracic Spine 2 View    Standing Status:   Future    Standing Expiration Date:   11/28/2020    Order Specific Question:   Reason for Exam (SYMPTOM  OR DIAGNOSIS REQUIRED)    Answer:   mid  back pain    Order Specific Question:   Is patient pregnant?    Answer:   No    Order Specific Question:   Preferred imaging location?    Answer:   [facility]    Order Specific Question:   Radiology Contrast Protocol - do NOT remove file path    Answer:   [email or web address]     I discussed the assessment and treatment plan with the patient. The patient was provided an opportunity to ask questions and all were answered. The patient agreed with the plan and demonstrated an understanding of the instructions.   The patient was advised to call back or seek an in-person evaluation if the symptoms worsen or if the condition fails to improve as anticipated.  Ashok Sawaya, MD  

## 2019-11-29 ENCOUNTER — Encounter: Payer: Self-pay | Admitting: Internal Medicine

## 2019-11-29 DIAGNOSIS — Z1239 Encounter for other screening for malignant neoplasm of breast: Secondary | ICD-10-CM | POA: Insufficient documentation

## 2019-11-29 DIAGNOSIS — M549 Dorsalgia, unspecified: Secondary | ICD-10-CM | POA: Insufficient documentation

## 2019-11-29 NOTE — Assessment & Plan Note (Signed)
Back pain as outlined.  Persistent.  Check thoracic xray.

## 2019-11-29 NOTE — Assessment & Plan Note (Signed)
Receiving allergy shots.  Doing well.  Breathing doing well.  Follow.

## 2019-11-29 NOTE — Assessment & Plan Note (Signed)
On pravastatin.  Low cholesterol diet and exercise.  Follow lipid panel and liver function tests.   

## 2019-11-29 NOTE — Assessment & Plan Note (Signed)
Follow cbc.  

## 2019-11-29 NOTE — Assessment & Plan Note (Signed)
On lexapro.  Stable. f ollow.

## 2019-11-29 NOTE — Assessment & Plan Note (Signed)
Low carb diet and exercise.  Follow liver function tests.   

## 2019-11-29 NOTE — Assessment & Plan Note (Signed)
Colonoscopy 11/2014.  Recommended f/u colonoscopy in 5 years.

## 2019-11-29 NOTE — Assessment & Plan Note (Signed)
On thyroid medication.  Follow tsh.  

## 2019-11-29 NOTE — Assessment & Plan Note (Signed)
Continue symbicort.  Doing well on current regimen.  Follow.

## 2019-11-29 NOTE — Assessment & Plan Note (Signed)
Gets mammogram at [facility].  Has appt 01/2020.

## 2019-11-29 NOTE — Assessment & Plan Note (Signed)
Continue cpap.  

## 2019-11-29 NOTE — Assessment & Plan Note (Signed)
No upper symptoms reported. On prilosec.  

## 2019-12-03 ENCOUNTER — Encounter: Payer: Self-pay | Admitting: Internal Medicine

## 2019-12-07 ENCOUNTER — Other Ambulatory Visit: Payer: Self-pay

## 2019-12-07 ENCOUNTER — Ambulatory Visit (HOSPITAL_COMMUNITY)
Admission: RE | Admit: 2019-12-07 | Discharge: 2019-12-07 | Disposition: A | Discharge status: Home / Self Care | Payer: Commercial Managed Care - PPO | Source: Ambulatory Visit | Attending: Internal Medicine | Admitting: Internal Medicine

## 2019-12-07 DIAGNOSIS — M546 Pain in thoracic spine: Secondary | ICD-10-CM | POA: Insufficient documentation

## 2019-12-07 NOTE — Telephone Encounter (Signed)
Patient called in wanted to know about the appointment for her X-Ray did not get a name or a phone number .

## 2019-12-07 NOTE — Telephone Encounter (Signed)
Left detailed message for patient.

## 2019-12-10 ENCOUNTER — Encounter: Payer: Self-pay | Admitting: Internal Medicine

## 2019-12-10 DIAGNOSIS — K8020 Calculus of gallbladder without cholecystitis without obstruction: Secondary | ICD-10-CM

## 2019-12-10 DIAGNOSIS — M549 Dorsalgia, unspecified: Secondary | ICD-10-CM

## 2019-12-10 NOTE — Telephone Encounter (Signed)
Order placed for GI referral and for abdominal ultrasound.

## 2019-12-10 NOTE — Telephone Encounter (Signed)
Order placed for abdominal ultrasound.   

## 2019-12-11 ENCOUNTER — Encounter: Payer: Self-pay | Admitting: Internal Medicine

## 2019-12-17 ENCOUNTER — Ambulatory Visit (HOSPITAL_COMMUNITY)
Admission: RE | Admit: 2019-12-17 | Discharge: 2019-12-17 | Disposition: A | Discharge status: Home / Self Care | Payer: Commercial Managed Care - PPO | Source: Ambulatory Visit | Attending: Internal Medicine | Admitting: Internal Medicine

## 2019-12-17 ENCOUNTER — Other Ambulatory Visit: Payer: Self-pay

## 2019-12-17 DIAGNOSIS — K8020 Calculus of gallbladder without cholecystitis without obstruction: Secondary | ICD-10-CM | POA: Insufficient documentation

## 2019-12-17 DIAGNOSIS — M549 Dorsalgia, unspecified: Secondary | ICD-10-CM | POA: Insufficient documentation

## 2019-12-19 ENCOUNTER — Other Ambulatory Visit: Payer: Self-pay | Admitting: Internal Medicine

## 2019-12-19 ENCOUNTER — Telehealth: Payer: Self-pay | Admitting: Internal Medicine

## 2019-12-19 NOTE — Progress Notes (Signed)
Lab orders are in the system.  Needs lab appt scheduled at [facility].

## 2019-12-19 NOTE — Telephone Encounter (Signed)
Needs fasting lab appt scheduled at [facility] in the next 1-2 weeks.  Please schedule and notify pt of appt date and time.  Thanks.

## 2019-12-21 NOTE — Telephone Encounter (Signed)
Lm to let pt know that [facility] labs are walk-in

## 2019-12-25 ENCOUNTER — Other Ambulatory Visit (INDEPENDENT_AMBULATORY_CARE_PROVIDER_SITE_OTHER): Payer: Commercial Managed Care - PPO

## 2019-12-25 ENCOUNTER — Telehealth: Payer: Self-pay | Admitting: Internal Medicine

## 2019-12-25 DIAGNOSIS — D72829 Elevated white blood cell count, unspecified: Secondary | ICD-10-CM

## 2019-12-25 DIAGNOSIS — E7800 Pure hypercholesterolemia, unspecified: Secondary | ICD-10-CM

## 2019-12-25 LAB — HEPATIC FUNCTION PANEL
ALT: 17 U/L (ref 0–35)
AST: 17 U/L (ref 0–37)
Albumin: 4.3 g/dL (ref 3.5–5.2)
Alkaline Phosphatase: 88 U/L (ref 39–117)
Bilirubin, Direct: 0.1 mg/dL (ref 0.0–0.3)
Total Bilirubin: 0.4 mg/dL (ref 0.2–1.2)
Total Protein: 7.5 g/dL (ref 6.0–8.3)

## 2019-12-25 LAB — CBC WITH DIFFERENTIAL/PLATELET
Basophils Absolute: 0.1 10*3/uL (ref 0.0–0.1)
Basophils Relative: 0.8 % (ref 0.0–3.0)
Eosinophils Absolute: 0.3 10*3/uL (ref 0.0–0.7)
Eosinophils Relative: 3.8 % (ref 0.0–5.0)
HCT: 39.6 % (ref 36.0–46.0)
Hemoglobin: 13.1 g/dL (ref 12.0–15.0)
Lymphocytes Relative: 25.5 % (ref 12.0–46.0)
Lymphs Abs: 2.1 10*3/uL (ref 0.7–4.0)
MCHC: 33 g/dL (ref 30.0–36.0)
MCV: 83.5 fl (ref 78.0–100.0)
Monocytes Absolute: 0.6 10*3/uL (ref 0.1–1.0)
Monocytes Relative: 7.5 % (ref 3.0–12.0)
Neutro Abs: 5.2 10*3/uL (ref 1.4–7.7)
Neutrophils Relative %: 62.4 % (ref 43.0–77.0)
Platelets: 300 10*3/uL (ref 150.0–400.0)
RBC: 4.74 Mil/uL (ref 3.87–5.11)
RDW: 14.8 % (ref 11.5–15.5)
WBC: 8.3 10*3/uL (ref 4.0–10.5)

## 2019-12-25 LAB — BASIC METABOLIC PANEL
BUN: 11 mg/dL (ref 6–23)
CO2: 30 mEq/L (ref 19–32)
Calcium: 9.4 mg/dL (ref 8.4–10.5)
Chloride: 102 mEq/L (ref 96–112)
Creatinine, Ser: 0.89 mg/dL (ref 0.40–1.20)
GFR: 72.25 mL/min (ref >60.00)
Glucose, Bld: 96 mg/dL (ref 70–99)
Potassium: 4.4 mEq/L (ref 3.5–5.1)
Sodium: 139 mEq/L (ref 135–145)

## 2019-12-25 LAB — LIPID PANEL
Cholesterol: 206 mg/dL — ABNORMAL HIGH (ref 0–200)
HDL: 50.5 mg/dL (ref >39.00)
LDL Cholesterol: 129 mg/dL — ABNORMAL HIGH (ref 0–99)
NonHDL: 155.61
Total CHOL/HDL Ratio: 4
Triglycerides: 134 mg/dL (ref 0.0–149.0)
VLDL: 26.8 mg/dL (ref 0.0–40.0)

## 2019-12-25 NOTE — Telephone Encounter (Signed)
  Pt notified of lab results via my chart.  Accidentally closed lab note.

## 2020-01-27 ENCOUNTER — Other Ambulatory Visit: Payer: Self-pay | Admitting: Internal Medicine

## 2020-02-01 ENCOUNTER — Telehealth (INDEPENDENT_AMBULATORY_CARE_PROVIDER_SITE_OTHER): Payer: Commercial Managed Care - PPO | Admitting: Internal Medicine

## 2020-02-01 ENCOUNTER — Telehealth: Payer: Self-pay | Admitting: Internal Medicine

## 2020-02-01 DIAGNOSIS — J3489 Other specified disorders of nose and nasal sinuses: Secondary | ICD-10-CM | POA: Diagnosis not present

## 2020-02-01 DIAGNOSIS — J4520 Mild intermittent asthma, uncomplicated: Secondary | ICD-10-CM

## 2020-02-01 MED ORDER — AMOXICILLIN-POT CLAVULANATE 875-125 MG PO TABS: 1.0000 | ORAL_TABLET | Freq: Two times a day (BID) | ORAL | 0 refills | Status: DC

## 2020-02-01 NOTE — Telephone Encounter (Signed)
Patient stated that she is going out of town tomorrow and thinks she has a sinus infection. No fever chills etc. Mostly nasal congestion. Symptoms started last Tuesday. Covid negative Friday at [facility]. Agreeable to do virtual, are you ok to add her on at 5:00?

## 2020-02-01 NOTE — Telephone Encounter (Signed)
Yes.  Just let her know I amy be running a little late.

## 2020-02-01 NOTE — Telephone Encounter (Signed)
LM to call back to schedule

## 2020-02-01 NOTE — Telephone Encounter (Signed)
Patient called in want to know if Dr.Scott would call her a antibiotic for a sinus infection

## 2020-02-01 NOTE — Progress Notes (Signed)
Patient ID: Bethany Cook, female   DOB: 07-06-1965, 54 y.o.   MRN: [id number]   Virtual Visit via video Note  This visit type was conducted due to national recommendations for restrictions regarding the COVID-19 pandemic (e.g. social distancing).  This format is felt to be most appropriate for this patient at this time.  All issues noted in this document were discussed and addressed.  No physical exam was performed (except for noted visual exam findings with Video Visits).   I connected with Myna Bright by a video enabled telemedicine application and verified that I am speaking with the correct person using two identifiers. Location patient: home Location provider: work  Persons participating in the virtual visit: patient, provider  The limitations, risks, security and privacy concerns of performing an evaluation and management service by video and the availability of in person appointments have been discussed.  It has also been discussed with the patient that there may be a patient responsible charge related to this service. The patient expressed understanding and agreed to proceed.   Reason for visit: work in appt.   HPI: Work in for possible sinus infection.  States symptoms started one week ago.  Started with sore throat, sneezing and runny nose.  Increased post nasal drainage.  No documented fever.  Felt "clammy".  Increased sinus pressure - hurts into her teeth.  Persistent scratchy throat.  Some decreased appetite.  No significant cough.  When coughing - yellow mucus production.  No chest pain, chest congestion or sob.  No nausea, vomiting or diarrhea.  Some decreased appetite.  Has not been around anyone with known covid. Had covid test at pharmacy - negative.    ROS: See pertinent positives and negatives per HPI.  Past Medical History:  Diagnosis Date  . Anemia    iron deficient  . Asthma   . Change in bowel movement 12/21/2016  . Cholelithiasis 10/27/2016  . Dizziness  12/26/2014  . Environmental allergies   . Family history of colon cancer 04/25/2013   Colonoscopy 11/18/14 - diverticulosis (sigmoid - mild).  No colorectal neoplasia.  Recommend f/u colonoscopy in five years.    . Fatty liver 10/27/2016  . GERD (gastroesophageal reflux disease) 04/25/2013  . Health care maintenance 04/23/2014  . Hypercholesterolemia   . Hyperthyroidism    s/p ablation  . Hypothyroidism 01/27/2012  . Lung nodule 10/27/2016  . Viral upper respiratory illness 05/30/2014    Past Surgical History:  Procedure Laterality Date  . NO PAST SURGERIES      Family History  Problem Relation Age of Onset  . Colon cancer Father   . Colon cancer Paternal Uncle        x4  . Asthma Other        cousine  . Breast cancer Neg Hx     SOCIAL HX: reviewed.    Current Outpatient Medications:  .  albuterol (PROVENTIL HFA;VENTOLIN HFA) 108 (90 Base) MCG/ACT inhaler, Inhale 1 puff into the lungs every 6 (six) hours as needed for wheezing or shortness of breath., Disp: 21 Inhaler, Rfl: 3 .  albuterol (PROVENTIL) (2.5 MG/3ML) 0.083% nebulizer solution, Take 3 mLs (2.5 mg total) by nebulization every 6 (six) hours as needed for wheezing or shortness of breath., Disp: 150 mL, Rfl: 1 .  ALPRAZolam (XANAX) 0.25 MG tablet, Take 0.25 mg by mouth every 8 (eight) hours as needed., Disp: , Rfl:  .  budesonide-formoterol (SYMBICORT) 160-4.5 MCG/ACT inhaler, Inhale 2 puffs into the lungs 2 (two) times  daily., Disp: , Rfl:  .  EPINEPHrine 0.3 mg/0.3 mL IJ SOAJ injection, Inject into the muscle as directed., Disp: , Rfl:  .  escitalopram (LEXAPRO) 20 MG tablet, TAKE 1 TABLET BY MOUTH  DAILY, Disp: 90 tablet, Rfl: 3 .  levocetirizine (XYZAL) 5 MG tablet, Take 5 mg by mouth every evening., Disp: , Rfl:  .  levothyroxine (SYNTHROID) 112 MCG tablet, TAKE 1 TABLET BY MOUTH  DAILY, Disp: 90 tablet, Rfl: 3 .  montelukast (SINGULAIR) 10 MG tablet, TAKE 1 TABLET BY MOUTH AT  BEDTIME, Disp: 90 tablet, Rfl: 3 .   omeprazole (PRILOSEC) 40 MG capsule, TAKE 1 CAPSULE BY MOUTH  DAILY, Disp: 90 capsule, Rfl: 3 .  pravastatin (PRAVACHOL) 10 MG tablet, TAKE 1 TABLET BY MOUTH AT  NIGHT, Disp: 90 tablet, Rfl: 3 .  zolpidem (AMBIEN) 10 MG tablet, Take 1 tablet by mouth at bedtime as needed., Disp: , Rfl:  .  amoxicillin-clavulanate (AUGMENTIN) 875-125 MG tablet, Take 1 tablet by mouth 2 (two) times daily., Disp: 20 tablet, Rfl: 0  EXAM:  GENERAL: alert, oriented, appears well and in no acute distress  HEENT: atraumatic, conjunttiva clear, no obvious abnormalities on inspection of external nose and ears  NECK: normal movements of the head and neck  LUNGS: on inspection no signs of respiratory distress, breathing rate appears normal, no obvious gross SOB, gasping or wheezing  CV: no obvious cyanosis  PSYCH/NEURO: pleasant and cooperative, no obvious depression or anxiety, speech and thought processing grossly intact  ASSESSMENT AND PLAN:  Discussed the following assessment and plan:  Problem List Items Addressed This Visit    Nasal drainage    Increased sore throat, nasal congestion, drainage and sinus pressure.  Symptoms appear to be c/w sinusitis.  Continue steroid nasal spray, mucinex and inhalers as she is doing.  Prescription sent in for augmentin to have if needed - if symptoms persists/worsen.  Discussed possible covid.  Will covid test. Discussed quarantine until results available.        Asthma    Continue symbicort, singulair and albuterol if needed.  Breathing stable.  No chest congestion.  Follow.            I discussed the assessment and treatment plan with the patient. The patient was provided an opportunity to ask questions and all were answered. The patient agreed with the plan and demonstrated an understanding of the instructions.   The patient was advised to call back or seek an in-person evaluation if the symptoms worsen or if the condition fails to improve as  anticipated.    Dale [place], MD

## 2020-02-02 ENCOUNTER — Other Ambulatory Visit: Payer: Commercial Managed Care - PPO

## 2020-02-02 ENCOUNTER — Other Ambulatory Visit: Payer: Self-pay

## 2020-02-02 ENCOUNTER — Encounter: Payer: Self-pay | Admitting: Internal Medicine

## 2020-02-02 DIAGNOSIS — J3489 Other specified disorders of nose and nasal sinuses: Secondary | ICD-10-CM

## 2020-02-02 NOTE — Addendum Note (Signed)
Addended by: Larry Sierras on: 02/02/2020 01:18 PM   Modules accepted: Orders

## 2020-02-02 NOTE — Assessment & Plan Note (Signed)
Continue symbicort, singulair and albuterol if needed.  Breathing stable.  No chest congestion.  Follow.

## 2020-02-02 NOTE — Assessment & Plan Note (Signed)
Increased sore throat, nasal congestion, drainage and sinus pressure.  Symptoms appear to be c/w sinusitis.  Continue steroid nasal spray, mucinex and inhalers as she is doing.  Prescription sent in for augmentin to have if needed - if symptoms persists/worsen.  Discussed possible covid.  Will covid test. Discussed quarantine until results available.

## 2020-02-04 LAB — NOVEL CORONAVIRUS, NAA: SARS-CoV-2, NAA: NOT DETECTED

## 2020-02-04 LAB — SARS-COV-2, NAA 2 DAY TAT

## 2020-02-11 ENCOUNTER — Other Ambulatory Visit: Payer: Self-pay | Admitting: Internal Medicine

## 2020-04-20 LAB — HM COLONOSCOPY

## 2020-09-29 ENCOUNTER — Other Ambulatory Visit: Payer: Self-pay | Admitting: Internal Medicine

## 2020-10-14 ENCOUNTER — Encounter: Payer: Self-pay | Admitting: Internal Medicine

## 2020-10-15 ENCOUNTER — Telehealth: Payer: Self-pay | Admitting: Internal Medicine

## 2020-10-15 NOTE — Telephone Encounter (Signed)
Please schedule her for a physical.  She is overdue.

## 2020-10-17 NOTE — Telephone Encounter (Signed)
Lmtcb an schedule CPE

## 2020-12-29 ENCOUNTER — Other Ambulatory Visit: Payer: Self-pay | Admitting: Internal Medicine

## 2021-01-02 ENCOUNTER — Encounter: Payer: Self-pay | Admitting: Internal Medicine

## 2021-01-02 NOTE — Telephone Encounter (Signed)
Pt says that back pain has been going on but abd pain is new as of yesterday. She is going to reach out to her GI doctor. Patient is aware that Dr Lorin Picket is out of the office. Advised to let me know if she needs anything. Patient gave verbal understanding.

## 2021-02-22 ENCOUNTER — Other Ambulatory Visit: Payer: Self-pay | Admitting: Internal Medicine

## 2021-04-14 LAB — HM PAP SMEAR

## 2021-04-25 ENCOUNTER — Ambulatory Visit: Payer: Commercial Managed Care - PPO | Admitting: Internal Medicine

## 2021-04-25 ENCOUNTER — Other Ambulatory Visit: Payer: Self-pay

## 2021-04-25 DIAGNOSIS — E7800 Pure hypercholesterolemia, unspecified: Secondary | ICD-10-CM

## 2021-04-25 DIAGNOSIS — K219 Gastro-esophageal reflux disease without esophagitis: Secondary | ICD-10-CM

## 2021-04-25 DIAGNOSIS — J4520 Mild intermittent asthma, uncomplicated: Secondary | ICD-10-CM | POA: Diagnosis not present

## 2021-04-25 DIAGNOSIS — G4733 Obstructive sleep apnea (adult) (pediatric): Secondary | ICD-10-CM

## 2021-04-25 DIAGNOSIS — Z9109 Other allergy status, other than to drugs and biological substances: Secondary | ICD-10-CM

## 2021-04-25 DIAGNOSIS — Z800 Family history of malignant neoplasm of digestive organs: Secondary | ICD-10-CM

## 2021-04-25 DIAGNOSIS — D649 Anemia, unspecified: Secondary | ICD-10-CM

## 2021-04-25 DIAGNOSIS — K760 Fatty (change of) liver, not elsewhere classified: Secondary | ICD-10-CM

## 2021-04-25 DIAGNOSIS — F439 Reaction to severe stress, unspecified: Secondary | ICD-10-CM

## 2021-04-25 DIAGNOSIS — E039 Hypothyroidism, unspecified: Secondary | ICD-10-CM

## 2021-04-25 MED ORDER — AZELASTINE HCL 0.1 % NA SOLN: 1.0000 | Freq: Two times a day (BID) | NASAL | 1 refills | Status: DC

## 2021-04-25 NOTE — Progress Notes (Signed)
Patient ID: Bethany Cook, female   DOB: [birth year], 56 y.o.   MRN: [id number]   Subjective:    Patient ID: Bethany Cook, female    DOB: 1965/08/28, 56 y.o.   MRN: [id number]  This visit occurred during the SARS-CoV-2 public health emergency.  Safety protocols were in place, including screening questions prior to the visit, additional usage of staff PPE, and extensive cleaning of exam room while observing appropriate contact time as indicated for disinfecting solutions.   Patient here for a follow up appt.   Marland Kitchen   HPI Here to follow up regarding her cholesterol, thyroid and asthma.  States her breathing has been doing well.  No increased sob. No increased cough or congestion.  No abdominal pain or bowel change reported.  Increased stress.  Discussed.  Has been on lexapro.  Feels needs something different, etc.  Discussed psychiatry referral.  She is agreeable.  Up to date with colonoscopy.     Past Medical History:  Diagnosis Date   Anemia    iron deficient   Asthma    Change in bowel movement 12/21/2016   Cholelithiasis 10/27/2016   Dizziness 12/26/2014   Environmental allergies    Family history of colon cancer 04/25/2013   Colonoscopy 11/18/14 - diverticulosis (sigmoid - mild).  No colorectal neoplasia.  Recommend f/u colonoscopy in five years.     Fatty liver 10/27/2016   GERD (gastroesophageal reflux disease) 04/25/2013   Health care maintenance 04/23/2014   Hypercholesterolemia    Hyperthyroidism    s/p ablation   Hypothyroidism 01/27/2012   Lung nodule 10/27/2016   Viral upper respiratory illness 05/30/2014   Past Surgical History:  Procedure Laterality Date   NO PAST SURGERIES     Family History  Problem Relation Age of Onset   Colon cancer Father    Colon cancer Paternal Uncle        x4   Asthma Other        cousine   Breast cancer Neg Hx    Social History   Socioeconomic History   Marital status: Single    Spouse name: Not on file   Number of children: 0   Years  of education: Not on file   Highest education level: Not on file  Occupational History   Not on file  Tobacco Use   Smoking status: Never   Smokeless tobacco: Never  Vaping Use   Vaping Use: Never used  Substance and Sexual Activity   Alcohol use: Yes    Alcohol/week: 0.0 standard drinks    Comment: occasional   Drug use: No   Sexual activity: Not on file  Other Topics Concern   Not on file  Social History Narrative   Not on file   Social Determinants of Health   Financial Resource Strain: Not on file  Food Insecurity: Not on file  Transportation Needs: Not on file  Physical Activity: Not on file  Stress: Not on file  Social Connections: Not on file     Review of Systems  Constitutional:  Negative for appetite change and unexpected weight change.  HENT:  Negative for congestion and sinus pressure.   Respiratory:  Negative for cough, chest tightness and shortness of breath.   Cardiovascular:  Negative for chest pain, palpitations and leg swelling.  Gastrointestinal:  Negative for abdominal pain, diarrhea, nausea and vomiting.  Genitourinary:  Negative for difficulty urinating and dysuria.  Musculoskeletal:  Negative for joint swelling and myalgias.  Skin:  Negative  for color change and rash.  Neurological:  Negative for dizziness, light-headedness and headaches.  Psychiatric/Behavioral:  Negative for agitation and dysphoric mood.       Objective:     BP 126/78    Pulse 73    Temp 98 F (36.7 C)    Resp 16    Ht 5\' 3"  (1.6 m)    Wt 223 lb 3.2 oz (101.2 kg)    SpO2 97%    BMI 39.54 kg/m  Wt Readings from Last 3 Encounters:  04/25/21 223 lb 3.2 oz (101.2 kg)  02/01/20 209 lb (94.8 kg)  11/24/19 209 lb (94.8 kg)    Physical Exam Vitals reviewed.  Constitutional:      General: She is not in acute distress.    Appearance: Normal appearance.  HENT:     Head: Normocephalic and atraumatic.     Right Ear: External ear normal.     Left Ear: External ear normal.   Eyes:     General: No scleral icterus.       Right eye: No discharge.        Left eye: No discharge.     Conjunctiva/sclera: Conjunctivae normal.  Neck:     Thyroid: No thyromegaly.  Cardiovascular:     Rate and Rhythm: Normal rate and regular rhythm.  Pulmonary:     Effort: No respiratory distress.     Breath sounds: Normal breath sounds. No wheezing.  Abdominal:     General: Bowel sounds are normal.     Palpations: Abdomen is soft.     Tenderness: There is no abdominal tenderness.  Musculoskeletal:        General: No swelling or tenderness.     Cervical back: Neck supple. No tenderness.  Lymphadenopathy:     Cervical: No cervical adenopathy.  Skin:    Findings: No erythema or rash.  Neurological:     Mental Status: She is alert.  Psychiatric:        Mood and Affect: Mood normal.        Behavior: Behavior normal.     Outpatient Encounter Medications as of 04/25/2021  Medication Sig   azelastine (ASTELIN) 0.1 % nasal spray Place 1 spray into both nostrils 2 (two) times daily. Use in each nostril as directed   albuterol (PROVENTIL HFA;VENTOLIN HFA) 108 (90 Base) MCG/ACT inhaler Inhale 1 puff into the lungs every 6 (six) hours as needed for wheezing or shortness of breath.   albuterol (PROVENTIL) (2.5 MG/3ML) 0.083% nebulizer solution Take 3 mLs (2.5 mg total) by nebulization every 6 (six) hours as needed for wheezing or shortness of breath.   ALPRAZolam (XANAX) 0.25 MG tablet Take 0.25 mg by mouth every 8 (eight) hours as needed.   amoxicillin-clavulanate (AUGMENTIN) 875-125 MG tablet Take 1 tablet by mouth 2 (two) times daily.   budesonide-formoterol (SYMBICORT) 160-4.5 MCG/ACT inhaler Inhale 2 puffs into the lungs 2 (two) times daily.   EPINEPHrine 0.3 mg/0.3 mL IJ SOAJ injection Inject into the muscle as directed.   escitalopram (LEXAPRO) 20 MG tablet TAKE 1 TABLET BY MOUTH  DAILY   levocetirizine (XYZAL) 5 MG tablet Take 5 mg by mouth every evening.   levothyroxine  (SYNTHROID) 112 MCG tablet TAKE 1 TABLET BY MOUTH  DAILY   montelukast (SINGULAIR) 10 MG tablet TAKE 1 TABLET BY MOUTH AT  BEDTIME   omeprazole (PRILOSEC) 40 MG capsule TAKE 1 CAPSULE BY MOUTH  DAILY   pravastatin (PRAVACHOL) 10 MG tablet TAKE 1 TABLET BY  MOUTH AT  NIGHT   zolpidem (AMBIEN) 10 MG tablet Take 1 tablet by mouth at bedtime as needed.   No facility-administered encounter medications on file as of 04/25/2021.     Lab Results  Component Value Date   WBC 8.3 12/25/2019   HGB 13.1 12/25/2019   HCT 39.6 12/25/2019   PLT 300.0 12/25/2019   GLUCOSE 96 12/25/2019   CHOL 206 (H) 12/25/2019   TRIG 134.0 12/25/2019   HDL 50.50 12/25/2019   LDLDIRECT 157.7 04/21/2013   LDLCALC 129 (H) 12/25/2019   ALT 17 12/25/2019   AST 17 12/25/2019   NA 139 12/25/2019   K 4.4 12/25/2019   CL 102 12/25/2019   CREATININE 0.89 12/25/2019   BUN 11 12/25/2019   CO2 30 12/25/2019   TSH 0.76 05/28/2019    US Abdomen Complete  Result Date: 12/17/2019 CLINICAL DATA:  Gallstones, bilateral flank pain. EXAM: ABDOMEN ULTRASOUND COMPLETE COMPARISON:  Abdominal ultrasound 09/24/2016 FINDINGS: Gallbladder: There is a shadowing gallstone measuring 1.2 cm. No gallbladder wall thickening. No sonographic Murphy sign noted by sonographer. Common bile duct: Diameter: 0.4 cm, within normal limits. Liver: No focal lesion identified. Diffusely increased liver parenchymal echogenicity. Portal vein is patent on color Doppler imaging with normal direction of blood flow towards the liver. IVC: No abnormality visualized. Pancreas: Visualized portion unremarkable. Spleen: Size and appearance within normal limits.  Small splenule. Right Kidney: Length: 11.3 cm. Echogenicity within normal limits. No mass or hydronephrosis visualized. Left Kidney: Length: 11.0 cm. Echogenicity within normal limits. No mass or hydronephrosis visualized. Abdominal aorta: No aneurysm visualized. Other findings: None. IMPRESSION: 1.  Cholelithiasis  without evidence of cholecystitis. 2. Diffusely increased liver parenchymal echogenicity most commonly seen with hepatic steatosis. Electronically Signed   By: Emmaline Kluver M.D.   On: 12/17/2019 11:17       Assessment & Plan:   Problem List Items Addressed This Visit     Anemia    Follow cbc.       Relevant Orders   CBC with Differential/Platelet   Asthma    Continue symbicort, singulair and albuterol.   Doing well on current regimen.  Breathing stable. Follow.        Environmental allergies    Continue singulair and xyzal.  Stable.       Family history of colon cancer    Apparently had colonoscopy recently - Dr Marca Ancona [facility].  Obtain records.       Fatty liver    Low carb diet and exercise.  Follow liver function tests.        GERD (gastroesophageal reflux disease)    No upper symptoms reported.  On prilosec.        Hypercholesterolemia    On pravastatin.  Low cholesterol diet and exercise.  Follow lipid panel and liver function tests.        Relevant Orders   Lipid panel   Hepatic function panel   Basic metabolic panel   Hypothyroidism    On thyroid medication.  Follow tsh.        Relevant Orders   TSH   OSA (obstructive sleep apnea)    Continue cpap.       Stress    Increased stress.  Discussed.  On lexapro.  Not sure if this is controlling symptoms.  Discussed psych referral.  Desires referral.       Relevant Orders   Ambulatory referral to Psychiatry     Dale [place], MD

## 2021-04-29 ENCOUNTER — Encounter: Payer: Self-pay | Admitting: Internal Medicine

## 2021-04-29 NOTE — Assessment & Plan Note (Signed)
Continue cpap.  

## 2021-04-29 NOTE — Assessment & Plan Note (Signed)
Low carb diet and exercise.  Follow liver function tests.   

## 2021-04-29 NOTE — Assessment & Plan Note (Signed)
Continue singulair and xyzal.  Stable.  

## 2021-04-29 NOTE — Assessment & Plan Note (Signed)
No upper symptoms reported. On prilosec.  

## 2021-04-29 NOTE — Assessment & Plan Note (Signed)
On thyroid medication.  Follow tsh.  

## 2021-04-29 NOTE — Assessment & Plan Note (Signed)
On pravastatin.  Low cholesterol diet and exercise.  Follow lipid panel and liver function tests.   

## 2021-04-29 NOTE — Assessment & Plan Note (Signed)
Continue symbicort, singulair and albuterol.   Doing well on current regimen.  Breathing stable. Follow.   

## 2021-04-29 NOTE — Assessment & Plan Note (Signed)
Apparently had colonoscopy recently - Dr Marca Ancona [facility].  Obtain records.

## 2021-04-29 NOTE — Assessment & Plan Note (Signed)
Increased stress.  Discussed.  On lexapro.  Not sure if this is controlling symptoms.  Discussed psych referral.  Desires referral.

## 2021-04-29 NOTE — Assessment & Plan Note (Signed)
Follow cbc.  

## 2021-05-05 ENCOUNTER — Other Ambulatory Visit (INDEPENDENT_AMBULATORY_CARE_PROVIDER_SITE_OTHER): Payer: Commercial Managed Care - PPO

## 2021-05-05 DIAGNOSIS — E039 Hypothyroidism, unspecified: Secondary | ICD-10-CM

## 2021-05-05 DIAGNOSIS — E7800 Pure hypercholesterolemia, unspecified: Secondary | ICD-10-CM

## 2021-05-05 DIAGNOSIS — D649 Anemia, unspecified: Secondary | ICD-10-CM | POA: Diagnosis not present

## 2021-05-05 LAB — BASIC METABOLIC PANEL
BUN: 13 mg/dL (ref 6–23)
CO2: 33 mEq/L — ABNORMAL HIGH (ref 19–32)
Calcium: 9.4 mg/dL (ref 8.4–10.5)
Chloride: 102 mEq/L (ref 96–112)
Creatinine, Ser: 0.85 mg/dL (ref 0.40–1.20)
GFR: 75.88 mL/min (ref >60.00)
Glucose, Bld: 103 mg/dL — ABNORMAL HIGH (ref 70–99)
Potassium: 4.2 mEq/L (ref 3.5–5.1)
Sodium: 140 mEq/L (ref 135–145)

## 2021-05-05 LAB — CBC WITH DIFFERENTIAL/PLATELET
Basophils Absolute: 0 10*3/uL (ref 0.0–0.1)
Basophils Relative: 0.2 % (ref 0.0–3.0)
Eosinophils Absolute: 0.2 10*3/uL (ref 0.0–0.7)
Eosinophils Relative: 2.4 % (ref 0.0–5.0)
HCT: 38.3 % (ref 36.0–46.0)
Hemoglobin: 12.7 g/dL (ref 12.0–15.0)
Lymphocytes Relative: 22.8 % (ref 12.0–46.0)
Lymphs Abs: 1.8 10*3/uL (ref 0.7–4.0)
MCHC: 33.2 g/dL (ref 30.0–36.0)
MCV: 82.8 fl (ref 78.0–100.0)
Monocytes Absolute: 0.7 10*3/uL (ref 0.1–1.0)
Monocytes Relative: 8.5 % (ref 3.0–12.0)
Neutro Abs: 5.2 10*3/uL (ref 1.4–7.7)
Neutrophils Relative %: 66.1 % (ref 43.0–77.0)
Platelets: 270 10*3/uL (ref 150.0–400.0)
RBC: 4.62 Mil/uL (ref 3.87–5.11)
RDW: 14.5 % (ref 11.5–15.5)
WBC: 7.8 10*3/uL (ref 4.0–10.5)

## 2021-05-05 LAB — HEPATIC FUNCTION PANEL
ALT: 19 U/L (ref 0–35)
AST: 18 U/L (ref 0–37)
Albumin: 4.4 g/dL (ref 3.5–5.2)
Alkaline Phosphatase: 81 U/L (ref 39–117)
Bilirubin, Direct: 0.1 mg/dL (ref 0.0–0.3)
Total Bilirubin: 0.5 mg/dL (ref 0.2–1.2)
Total Protein: 7.6 g/dL (ref 6.0–8.3)

## 2021-05-05 LAB — TSH: TSH: 0.88 u[IU]/mL (ref 0.35–5.50)

## 2021-05-05 LAB — LIPID PANEL
Cholesterol: 195 mg/dL (ref 0–200)
HDL: 49.3 mg/dL (ref >39.00)
LDL Cholesterol: 123 mg/dL — ABNORMAL HIGH (ref 0–99)
NonHDL: 145.27
Total CHOL/HDL Ratio: 4
Triglycerides: 109 mg/dL (ref 0.0–149.0)
VLDL: 21.8 mg/dL (ref 0.0–40.0)

## 2021-05-10 ENCOUNTER — Telehealth: Payer: Self-pay | Admitting: Internal Medicine

## 2021-05-10 NOTE — Telephone Encounter (Signed)
Messaged copied to chart.  ?

## 2021-05-20 ENCOUNTER — Other Ambulatory Visit: Payer: Self-pay | Admitting: Internal Medicine

## 2021-05-26 ENCOUNTER — Other Ambulatory Visit: Payer: Self-pay

## 2021-05-26 ENCOUNTER — Telehealth: Payer: Self-pay | Admitting: Internal Medicine

## 2021-05-26 MED ORDER — ESCITALOPRAM OXALATE 20 MG PO TABS: 20.0000 mg | ORAL_TABLET | Freq: Every day | ORAL | 3 refills | Status: DC

## 2021-05-26 NOTE — Telephone Encounter (Signed)
Medication refilled

## 2021-05-26 NOTE — Telephone Encounter (Signed)
Pt need refill on Escitalopram sent to [facility] in [place]. Pt has changed pharmacies ?

## 2021-05-31 ENCOUNTER — Encounter: Payer: Self-pay | Admitting: Internal Medicine

## 2021-06-19 ENCOUNTER — Encounter (INDEPENDENT_AMBULATORY_CARE_PROVIDER_SITE_OTHER): Payer: Self-pay

## 2021-06-19 ENCOUNTER — Ambulatory Visit (INDEPENDENT_AMBULATORY_CARE_PROVIDER_SITE_OTHER): Payer: Self-pay | Admitting: Vascular Surgery

## 2021-06-21 ENCOUNTER — Ambulatory Visit: Payer: Commercial Managed Care - PPO | Admitting: Pulmonary Disease

## 2021-06-21 ENCOUNTER — Encounter: Payer: Self-pay | Admitting: Pulmonary Disease

## 2021-06-21 VITALS — BP 120/80 | HR 71 | Temp 98.2°F | Ht 63.0 in | Wt 225.0 lb

## 2021-06-21 DIAGNOSIS — G4733 Obstructive sleep apnea (adult) (pediatric): Secondary | ICD-10-CM | POA: Diagnosis not present

## 2021-06-21 MED ORDER — ZOLPIDEM TARTRATE ER 6.25 MG PO TBCR: 6.2500 mg | EXTENDED_RELEASE_TABLET | Freq: Every evening | ORAL | 3 refills | Status: DC | PRN

## 2021-06-21 NOTE — Progress Notes (Signed)
? ?      ?Bethany Cook    [id number]    1965/09/07 ? ?Primary Care Physician:Scott, Westley Hummer, MD ? ?Referring Physician: Dale [place], MD ?[address] ?[address] ?[place],  [place] [address] ? ?Chief complaint:   ?History of obstructive sleep apnea ? ?HPI: ? ?History of obstructive sleep apnea, used to follow-up with Dr. Bard Herbert ? ?Continues to use CPAP nightly ?Usually sleeps about 8 hours ? ?She does still have multiple awakenings ?Uses Ambien 5 mg nightly ?Unable to get a good nights rest without sleep aid ? ?Does have mask leaks, has used the same type of mask over time ?She usually has to pull it quite tightly with the mask leaving marks on her face during the day ? ?Weight has remained about the same but over time is gained about 10 pounds ? ?Does have a history of asthma and allergies-better controlled with allergy shots recently ? ? ? ?Outpatient Encounter Medications as of 06/21/2021  ?Medication Sig  ? albuterol (PROVENTIL HFA;VENTOLIN HFA) 108 (90 Base) MCG/ACT inhaler Inhale 1 puff into the lungs every 6 (six) hours as needed for wheezing or shortness of breath.  ? albuterol (PROVENTIL) (2.5 MG/3ML) 0.083% nebulizer solution Take 3 mLs (2.5 mg total) by nebulization every 6 (six) hours as needed for wheezing or shortness of breath.  ? ALPRAZolam (XANAX) 0.25 MG tablet Take 0.25 mg by mouth every 8 (eight) hours as needed.  ? amoxicillin-clavulanate (AUGMENTIN) 875-125 MG tablet Take 1 tablet by mouth 2 (two) times daily.  ? Azelastine HCl 137 MCG/SPRAY SOLN PLACE 1 SPRAY INTO BOTH NOSTRILS 2 (TWO) TIMES DAILY. USE IN EACH NOSTRIL AS DIRECTED  ? budesonide-formoterol (SYMBICORT) 160-4.5 MCG/ACT inhaler Inhale 2 puffs into the lungs 2 (two) times daily.  ? EPINEPHrine 0.3 mg/0.3 mL IJ SOAJ injection Inject into the muscle as directed.  ? escitalopram (LEXAPRO) 20 MG tablet Take 1 tablet (20 mg total) by mouth daily.  ? levocetirizine (XYZAL) 5 MG tablet Take 5 mg by mouth every evening.   ? levothyroxine (SYNTHROID) 112 MCG tablet TAKE 1 TABLET BY MOUTH  DAILY  ? montelukast (SINGULAIR) 10 MG tablet TAKE 1 TABLET BY MOUTH AT  BEDTIME  ? omeprazole (PRILOSEC) 40 MG capsule TAKE 1 CAPSULE BY MOUTH  DAILY  ? pravastatin (PRAVACHOL) 10 MG tablet TAKE 1 TABLET BY MOUTH AT  NIGHT  ? zolpidem (AMBIEN CR) 6.25 MG CR tablet Take 1 tablet (6.25 mg total) by mouth at bedtime as needed for sleep.  ? zolpidem (AMBIEN) 10 MG tablet Take 1 tablet by mouth at bedtime as needed.  ? ?No facility-administered encounter medications on file as of 06/21/2021.  ? ? ?Allergies as of 06/21/2021 - Review Complete 06/21/2021  ?Allergen Reaction Noted  ? Erythromycin Rash 01/27/2012  ? ? ?Past Medical History:  ?Diagnosis Date  ? Anemia   ? iron deficient  ? Asthma   ? Change in bowel movement 12/21/2016  ? Cholelithiasis 10/27/2016  ? Dizziness 12/26/2014  ? Environmental allergies   ? Family history of colon cancer 04/25/2013  ? Colonoscopy 11/18/14 - diverticulosis (sigmoid - mild).  No colorectal neoplasia.  Recommend f/u colonoscopy in five years.    ? Fatty liver 10/27/2016  ? GERD (gastroesophageal reflux disease) 04/25/2013  ? Health care maintenance 04/23/2014  ? Hypercholesterolemia   ? Hyperthyroidism   ? s/p ablation  ? Hypothyroidism 01/27/2012  ? Lung nodule 10/27/2016  ? Viral upper respiratory illness 05/30/2014  ? ? ?Past Surgical History:  ?  Procedure Laterality Date  ? NO PAST SURGERIES    ? ? ?Family History  ?Problem Relation Age of Onset  ? Colon cancer Father   ? Colon cancer Paternal Uncle   ?     x4  ? Asthma Other   ?     cousine  ? Breast cancer Neg Hx   ? ? ?Social History  ? ?Socioeconomic History  ? Marital status: Single  ?  Spouse name: Not on file  ? Number of children: 0  ? Years of education: Not on file  ? Highest education level: Not on file  ?Occupational History  ? Not on file  ?Tobacco Use  ? Smoking status: Never  ? Smokeless tobacco: Never  ?Vaping Use  ? Vaping Use: Never used  ?Substance and  Sexual Activity  ? Alcohol use: Yes  ?  Alcohol/week: 0.0 standard drinks  ?  Comment: occasional  ? Drug use: No  ? Sexual activity: Not on file  ?Other Topics Concern  ? Not on file  ?Social History Narrative  ? Not on file  ? ?Social Determinants of Health  ? ?Financial Resource Strain: Not on file  ?Food Insecurity: Not on file  ?Transportation Needs: Not on file  ?Physical Activity: Not on file  ?Stress: Not on file  ?Social Connections: Not on file  ?Intimate Partner Violence: Not on file  ? ? ?Review of Systems  ?Respiratory:  Positive for apnea.   ?Psychiatric/Behavioral:  Positive for sleep disturbance.   ? ?Vitals:  ? 06/21/21 1550  ?BP: 120/80  ?Pulse: 71  ?Temp: 98.2 ?F (36.8 ?C)  ? ? ? ?Physical Exam ?Constitutional:   ?   Appearance: She is obese.  ?HENT:  ?   Head: Normocephalic.  ?   Mouth/Throat:  ?   Mouth: Mucous membranes are moist.  ?Cardiovascular:  ?   Rate and Rhythm: Normal rate and regular rhythm.  ?   Heart sounds: No murmur heard. ?  No friction rub.  ?Pulmonary:  ?   Effort: No respiratory distress.  ?   Breath sounds: No stridor. No wheezing or rhonchi.  ?Musculoskeletal:  ?   Cervical back: No rigidity or tenderness.  ?Neurological:  ?   Mental Status: She is alert.  ?Psychiatric:     ?   Mood and Affect: Mood normal.  ? ? ?  04/16/2018  ?  3:00 PM  ?Results of the Epworth flowsheet  ?Sitting and reading 2  ?Watching TV 1  ?Sitting, inactive in a public place (e.g. a theatre or a meeting) 1  ?As a passenger in a car for an hour without a break 0  ?Lying down to rest in the afternoon when circumstances permit 2  ?Sitting and talking to someone 0  ?Sitting quietly after a lunch without alcohol 1  ?In a car, while stopped for a few minutes in traffic 0  ?Total score 7  ? ? ?Data Reviewed: ?Download from the machine reveals excellent compliance at 93% ?Average use of 8 hours 3 minutes ?Set between 8 and 20 ?95 percentile pressure of 15.4 ?Occasional significant leaks ?Residual AHI of  11.7 ? ?Assessment:  ?Severe obstructive sleep apnea ?-Improved symptoms ?-AHI remains elevated ?-Having some mask issues with significant mask leaks on occasion ? ?Sleep maintenance insomnia ?-On Ambien ? ?Allergic rhinitis for which she is on allergy shots ? ?Asthma ?-Improvement in symptoms following initiation of allergy shots ? ?Plan/Recommendations: ?Continue CPAP ? ?Trial with a new mask ? ?Extended  release Ambien will be sent in-6.25 mg ? ?Encouraged to continue CPAP ? ?Follow-up in about 6 months ?We will see you sooner ? ?Encouraged weight loss efforts ? ? ? ? ?Virl Diamond MD ?[facility] ?06/21/2021, 4:25 PM ? ?CC: Dale [place], MD ? ? ? ?

## 2021-06-21 NOTE — Patient Instructions (Addendum)
?  I will see you in about 6 months ? ?DME referral for CPAP mask change ? ?Call us with significant concerns ? ?I will send in the prescription for 6.25 mg extended release Ambien in place of your current order ? ?Call with significant concerns ? ?If a different mask does not fit as well as the current month, go back to what ever fits the best ? ? ? ? ?

## 2021-07-24 ENCOUNTER — Other Ambulatory Visit: Payer: Self-pay | Admitting: Internal Medicine

## 2021-07-27 ENCOUNTER — Ambulatory Visit: Payer: Commercial Managed Care - PPO | Admitting: Internal Medicine

## 2021-08-14 ENCOUNTER — Telehealth: Payer: Self-pay | Admitting: Internal Medicine

## 2021-08-14 NOTE — Telephone Encounter (Signed)
Pt called stating that she has poison ivy on her face and want to be prescribed medication. I let pt know that the provider will not prescribe any medication without being seen first. I attempted to make appointment but it was to far out for pt. Pt stated could I just send the note back. I stated I would.

## 2021-08-14 NOTE — Telephone Encounter (Signed)
Pt sched for 8am 6/6

## 2021-08-15 ENCOUNTER — Encounter: Payer: Self-pay | Admitting: Internal Medicine

## 2021-08-15 ENCOUNTER — Ambulatory Visit: Payer: Commercial Managed Care - PPO | Admitting: Internal Medicine

## 2021-08-15 DIAGNOSIS — E039 Hypothyroidism, unspecified: Secondary | ICD-10-CM

## 2021-08-15 DIAGNOSIS — J4520 Mild intermittent asthma, uncomplicated: Secondary | ICD-10-CM

## 2021-08-15 DIAGNOSIS — K219 Gastro-esophageal reflux disease without esophagitis: Secondary | ICD-10-CM

## 2021-08-15 DIAGNOSIS — G4733 Obstructive sleep apnea (adult) (pediatric): Secondary | ICD-10-CM

## 2021-08-15 DIAGNOSIS — R21 Rash and other nonspecific skin eruption: Secondary | ICD-10-CM

## 2021-08-15 DIAGNOSIS — F439 Reaction to severe stress, unspecified: Secondary | ICD-10-CM

## 2021-08-15 MED ORDER — METHYLPREDNISOLONE 4 MG PO TBPK: ORAL_TABLET | ORAL | 0 refills | Status: DC

## 2021-08-15 NOTE — Progress Notes (Signed)
Patient ID: Bethany Cook, female   DOB: June 04, 1965, 56 y.o.   MRN: [id number]   Subjective:    Patient ID: Bethany Cook, female    DOB: June 04, 1965, 56 y.o.   MRN: [id number]   Patient here for work in appt.   Chief Complaint  Patient presents with   Follow-up   Rash   .   HPI Work in for rash on face. Was working in her yard Friday and came in contact with poison ivy.  Went in and washed off.  Went back out to work, but had a cloth that she used to wipe her face. Has since developed a rash - mostly face.  Minimal on her arm.  Increased itching.  Some increased swelling beside her eye - improved today.  No documented fever.  No chest pain or sob.  No increased cough or congestion.  Taking xyzal regularly.  Took benadryl yesterday evening.  Has noticed a spot - back - mid/left - stinging.  No rash.  Flares intermittently.  Overall handling stress relatively well.     Past Medical History:  Diagnosis Date   Anemia    iron deficient   Asthma    Change in bowel movement 12/21/2016   Cholelithiasis 10/27/2016   Dizziness 12/26/2014   Environmental allergies    Family history of colon cancer 04/25/2013   Colonoscopy 11/18/14 - diverticulosis (sigmoid - mild).  No colorectal neoplasia.  Recommend f/u colonoscopy in five years.     Fatty liver 10/27/2016   GERD (gastroesophageal reflux disease) 04/25/2013   Health care maintenance 04/23/2014   Hypercholesterolemia    Hyperthyroidism    s/p ablation   Hypothyroidism 01/27/2012   Lung nodule 10/27/2016   Viral upper respiratory illness 05/30/2014   Past Surgical History:  Procedure Laterality Date   NO PAST SURGERIES     Family History  Problem Relation Age of Onset   Colon cancer Father    Colon cancer Paternal Uncle        x4   Asthma Other        cousine   Breast cancer Neg Hx    Social History   Socioeconomic History   Marital status: Single    Spouse name: Not on file   Number of children: 0   Years of education: Not on  file   Highest education level: Not on file  Occupational History   Not on file  Tobacco Use   Smoking status: Never   Smokeless tobacco: Never  Vaping Use   Vaping Use: Never used  Substance and Sexual Activity   Alcohol use: Yes    Alcohol/week: 0.0 standard drinks of alcohol    Comment: occasional   Drug use: No   Sexual activity: Not on file  Other Topics Concern   Not on file  Social History Narrative   Not on file   Social Determinants of Health   Financial Resource Strain: Not on file  Food Insecurity: Not on file  Transportation Needs: Not on file  Physical Activity: Not on file  Stress: Not on file  Social Connections: Not on file     Review of Systems  Constitutional:  Negative for appetite change, fever and unexpected weight change.  HENT:  Negative for congestion and sinus pressure.   Respiratory:  Negative for cough and chest tightness.        Breathing stable.   Cardiovascular:  Negative for chest pain and palpitations.  Gastrointestinal:  Negative for abdominal  pain, diarrhea, nausea and vomiting.  Genitourinary:  Negative for difficulty urinating and dysuria.  Musculoskeletal:  Negative for joint swelling and myalgias.  Skin:  Positive for rash. Negative for wound.  Neurological:  Negative for dizziness, light-headedness and headaches.  Psychiatric/Behavioral:  Negative for agitation and dysphoric mood.        Objective:     BP 130/78 (BP Location: Left Arm, Patient Position: Sitting, Cuff Size: Large)   Pulse 65   Temp 98.2 F (36.8 C) (Temporal)   Resp 17   Ht 5\' 3"  (1.6 m)   Wt 225 lb 6.4 oz (102.2 kg)   SpO2 98%   BMI 39.93 kg/m  Wt Readings from Last 3 Encounters:  08/15/21 225 lb 6.4 oz (102.2 kg)  06/21/21 225 lb (102.1 kg)  04/25/21 223 lb 3.2 oz (101.2 kg)    Physical Exam Vitals reviewed.  Constitutional:      General: She is not in acute distress.    Appearance: Normal appearance.  HENT:     Head: Normocephalic and  atraumatic.     Right Ear: External ear normal.     Left Ear: External ear normal.  Eyes:     General: No scleral icterus.       Right eye: No discharge.        Left eye: No discharge.     Conjunctiva/sclera: Conjunctivae normal.  Neck:     Thyroid: No thyromegaly.  Cardiovascular:     Rate and Rhythm: Normal rate and regular rhythm.  Pulmonary:     Effort: No respiratory distress.     Breath sounds: Normal breath sounds. No wheezing.  Abdominal:     General: Bowel sounds are normal.     Palpations: Abdomen is soft.     Tenderness: There is no abdominal tenderness.  Musculoskeletal:        General: No swelling or tenderness.     Cervical back: Neck supple. No tenderness.  Lymphadenopathy:     Cervical: No cervical adenopathy.  Skin:    Comments: Some minimal soft tissue swelling - adjacent to right eye.  Erythema - surrounding eye - face.  Few lesions arm.    Neurological:     Mental Status: She is alert.  Psychiatric:        Mood and Affect: Mood normal.        Behavior: Behavior normal.      Outpatient Encounter Medications as of 08/15/2021  Medication Sig   albuterol (PROVENTIL HFA;VENTOLIN HFA) 108 (90 Base) MCG/ACT inhaler Inhale 1 puff into the lungs every 6 (six) hours as needed for wheezing or shortness of breath.   albuterol (PROVENTIL) (2.5 MG/3ML) 0.083% nebulizer solution Take 3 mLs (2.5 mg total) by nebulization every 6 (six) hours as needed for wheezing or shortness of breath.   ALPRAZolam (XANAX) 0.25 MG tablet Take 0.25 mg by mouth every 8 (eight) hours as needed.   amoxicillin-clavulanate (AUGMENTIN) 875-125 MG tablet Take 1 tablet by mouth 2 (two) times daily.   Azelastine HCl 137 MCG/SPRAY SOLN PLACE 1 SPRAY INTO BOTH NOSTRILS 2 (TWO) TIMES DAILY. USE IN EACH NOSTRIL AS DIRECTED   budesonide-formoterol (SYMBICORT) 160-4.5 MCG/ACT inhaler Inhale 2 puffs into the lungs 2 (two) times daily.   EPINEPHrine 0.3 mg/0.3 mL IJ SOAJ injection Inject into the muscle  as directed.   escitalopram (LEXAPRO) 20 MG tablet TAKE 1 TABLET BY MOUTH  DAILY   levocetirizine (XYZAL) 5 MG tablet Take 5 mg by mouth every evening.  levothyroxine (SYNTHROID) 112 MCG tablet TAKE 1 TABLET BY MOUTH  DAILY   methylPREDNISolone (MEDROL DOSEPAK) 4 MG TBPK tablet Medrol dosepak.  6 day taper.  Take as directed.   montelukast (SINGULAIR) 10 MG tablet TAKE 1 TABLET BY MOUTH AT  BEDTIME   omeprazole (PRILOSEC) 40 MG capsule TAKE 1 CAPSULE BY MOUTH  DAILY   pravastatin (PRAVACHOL) 10 MG tablet TAKE 1 TABLET BY MOUTH AT  NIGHT   zolpidem (AMBIEN CR) 6.25 MG CR tablet Take 1 tablet (6.25 mg total) by mouth at bedtime as needed for sleep.   zolpidem (AMBIEN) 10 MG tablet Take 1 tablet by mouth at bedtime as needed.   No facility-administered encounter medications on file as of 08/15/2021.     Lab Results  Component Value Date   WBC 7.8 05/05/2021   HGB 12.7 05/05/2021   HCT 38.3 05/05/2021   PLT 270.0 05/05/2021   GLUCOSE 103 (H) 05/05/2021   CHOL 195 05/05/2021   TRIG 109.0 05/05/2021   HDL 49.30 05/05/2021   LDLDIRECT 157.7 04/21/2013   LDLCALC 123 (H) 05/05/2021   ALT 19 05/05/2021   AST 18 05/05/2021   NA 140 05/05/2021   K 4.2 05/05/2021   CL 102 05/05/2021   CREATININE 0.85 05/05/2021   BUN 13 05/05/2021   CO2 33 (H) 05/05/2021   TSH 0.88 05/05/2021    US Abdomen Complete  Result Date: 12/17/2019 CLINICAL DATA:  Gallstones, bilateral flank pain. EXAM: ABDOMEN ULTRASOUND COMPLETE COMPARISON:  Abdominal ultrasound 09/24/2016 FINDINGS: Gallbladder: There is a shadowing gallstone measuring 1.2 cm. No gallbladder wall thickening. No sonographic Murphy sign noted by sonographer. Common bile duct: Diameter: 0.4 cm, within normal limits. Liver: No focal lesion identified. Diffusely increased liver parenchymal echogenicity. Portal vein is patent on color Doppler imaging with normal direction of blood flow towards the liver. IVC: No abnormality visualized. Pancreas:  Visualized portion unremarkable. Spleen: Size and appearance within normal limits.  Small splenule. Right Kidney: Length: 11.3 cm. Echogenicity within normal limits. No mass or hydronephrosis visualized. Left Kidney: Length: 11.0 cm. Echogenicity within normal limits. No mass or hydronephrosis visualized. Abdominal aorta: No aneurysm visualized. Other findings: None. IMPRESSION: 1.  Cholelithiasis without evidence of cholecystitis. 2. Diffusely increased liver parenchymal echogenicity most commonly seen with hepatic steatosis. Electronically Signed   By: Emmaline Kluver M.D.   On: 12/17/2019 11:17       Assessment & Plan:   Problem List Items Addressed This Visit     Asthma    Continue symbicort, singulair and albuterol.   Doing well on current regimen.  Breathing stable. Follow.        Relevant Medications   methylPREDNISolone (MEDROL DOSEPAK) 4 MG TBPK tablet   GERD (gastroesophageal reflux disease)    No upper symptoms reported.  On prilosec.        Hypothyroidism    On thyroid medication.  Follow tsh.        OSA (obstructive sleep apnea)    Continue cpap.       Rash    Appears to be c/w allergic reaction.  On xyzal.  Took benadryl. Is some better.  Given location and distribution, will treat with medro dosepak 6 day taper.  She has taken steroids and tolerated.  Follow.  Call with update.       Stress    Discussed.  On lexapro.  Overall appears to be handling things relatively well.  Follow.         Dale Moline, MD

## 2021-08-20 ENCOUNTER — Encounter: Payer: Self-pay | Admitting: Internal Medicine

## 2021-08-20 DIAGNOSIS — R21 Rash and other nonspecific skin eruption: Secondary | ICD-10-CM | POA: Insufficient documentation

## 2021-08-20 NOTE — Assessment & Plan Note (Signed)
No upper symptoms reported. On prilosec.  

## 2021-08-20 NOTE — Assessment & Plan Note (Signed)
Appears to be c/w allergic reaction.  On xyzal.  Took benadryl. Is some better.  Given location and distribution, will treat with medro dosepak 6 day taper.  She has taken steroids and tolerated.  Follow.  Call with update.

## 2021-08-20 NOTE — Assessment & Plan Note (Signed)
Continue cpap.  

## 2021-08-20 NOTE — Assessment & Plan Note (Signed)
On thyroid medication.  Follow tsh.  

## 2021-08-20 NOTE — Assessment & Plan Note (Signed)
Continue symbicort, singulair and albuterol.   Doing well on current regimen.  Breathing stable. Follow.   

## 2021-08-20 NOTE — Assessment & Plan Note (Signed)
Discussed.  On lexapro.  Overall appears to be handling things relatively well.  Follow.

## 2021-08-29 ENCOUNTER — Other Ambulatory Visit: Payer: Self-pay | Admitting: Internal Medicine

## 2021-08-30 ENCOUNTER — Other Ambulatory Visit: Payer: Self-pay | Admitting: Internal Medicine

## 2021-09-01 ENCOUNTER — Ambulatory Visit: Payer: Commercial Managed Care - PPO | Admitting: Internal Medicine

## 2021-09-18 ENCOUNTER — Telehealth: Payer: Self-pay

## 2021-09-18 ENCOUNTER — Telehealth: Payer: Self-pay | Admitting: Internal Medicine

## 2021-09-18 NOTE — Telephone Encounter (Signed)
error 

## 2021-09-18 NOTE — Telephone Encounter (Signed)
Pt called stating she was on Zpac and prednisone medication because of her respiratory infection and she is still no better

## 2021-09-18 NOTE — Telephone Encounter (Signed)
Per pt request - virtual appt kept

## 2021-09-18 NOTE — Telephone Encounter (Signed)
Noted.  Spoke to you about this pt.

## 2021-09-18 NOTE — Telephone Encounter (Signed)
FYI S/w pt - still with congestion, cough. No fever, no shortness of breath. Ears clogged - very bothersome.  Pt scheduled virtual through my chart for tomorrow 330 with you. Changed to an in person visit , for better eval. Ok w/ pt.

## 2021-09-19 ENCOUNTER — Telehealth (INDEPENDENT_AMBULATORY_CARE_PROVIDER_SITE_OTHER): Payer: Commercial Managed Care - PPO | Admitting: Internal Medicine

## 2021-09-19 DIAGNOSIS — H938X9 Other specified disorders of ear, unspecified ear: Secondary | ICD-10-CM

## 2021-09-19 DIAGNOSIS — H9319 Tinnitus, unspecified ear: Secondary | ICD-10-CM | POA: Diagnosis not present

## 2021-09-19 DIAGNOSIS — J4520 Mild intermittent asthma, uncomplicated: Secondary | ICD-10-CM

## 2021-09-19 MED ORDER — AMOXICILLIN-POT CLAVULANATE 875-125 MG PO TABS
1.0000 | ORAL_TABLET | Freq: Two times a day (BID) | ORAL | 0 refills | Status: DC
Start: 2021-09-19 — End: 2022-02-06

## 2021-09-19 NOTE — Progress Notes (Unsigned)
Patient ID: Bethany Cook, female   DOB: 04-26-1965, 56 y.o.   MRN: [id number]   Subjective:    Patient ID: Bethany Cook, female    DOB: 1965/05/11, 56 y.o.   MRN: [id number]  This visit occurred during the SARS-CoV-2 public health emergency.  Safety protocols were in place, including screening questions prior to the visit, additional usage of staff PPE, and extensive cleaning of exam room while observing appropriate contact time as indicated for disinfecting solutions.   Patient here for No chief complaint on file.  Marland Kitchen   HPI    Past Medical History:  Diagnosis Date   Anemia    iron deficient   Asthma    Change in bowel movement 12/21/2016   Cholelithiasis 10/27/2016   Dizziness 12/26/2014   Environmental allergies    Family history of colon cancer 04/25/2013   Colonoscopy 11/18/14 - diverticulosis (sigmoid - mild).  No colorectal neoplasia.  Recommend f/u colonoscopy in five years.     Fatty liver 10/27/2016   GERD (gastroesophageal reflux disease) 04/25/2013   Health care maintenance 04/23/2014   Hypercholesterolemia    Hyperthyroidism    s/p ablation   Hypothyroidism 01/27/2012   Lung nodule 10/27/2016   Viral upper respiratory illness 05/30/2014   Past Surgical History:  Procedure Laterality Date   NO PAST SURGERIES     Family History  Problem Relation Age of Onset   Colon cancer Father    Colon cancer Paternal Uncle        x4   Asthma Other        cousine   Breast cancer Neg Hx    Social History   Socioeconomic History   Marital status: Single    Spouse name: Not on file   Number of children: 0   Years of education: Not on file   Highest education level: Not on file  Occupational History   Not on file  Tobacco Use   Smoking status: Never   Smokeless tobacco: Never  Vaping Use   Vaping Use: Never used  Substance and Sexual Activity   Alcohol use: Yes    Alcohol/week: 0.0 standard drinks of alcohol    Comment: occasional   Drug use: No   Sexual activity:  Not on file  Other Topics Concern   Not on file  Social History Narrative   Not on file   Social Determinants of Health   Financial Resource Strain: Not on file  Food Insecurity: Not on file  Transportation Needs: Not on file  Physical Activity: Not on file  Stress: Not on file  Social Connections: Not on file     Review of Systems     Objective:     There were no vitals taken for this visit. Wt Readings from Last 3 Encounters:  08/15/21 225 lb 6.4 oz (102.2 kg)  06/21/21 225 lb (102.1 kg)  04/25/21 223 lb 3.2 oz (101.2 kg)    Physical Exam   Outpatient Encounter Medications as of 09/19/2021  Medication Sig   albuterol (PROVENTIL HFA;VENTOLIN HFA) 108 (90 Base) MCG/ACT inhaler Inhale 1 puff into the lungs every 6 (six) hours as needed for wheezing or shortness of breath.   albuterol (PROVENTIL) (2.5 MG/3ML) 0.083% nebulizer solution Take 3 mLs (2.5 mg total) by nebulization every 6 (six) hours as needed for wheezing or shortness of breath.   ALPRAZolam (XANAX) 0.25 MG tablet Take 0.25 mg by mouth every 8 (eight) hours as needed.   amoxicillin-clavulanate (AUGMENTIN) 875-125  MG tablet Take 1 tablet by mouth 2 (two) times daily.   Azelastine HCl 137 MCG/SPRAY SOLN PLACE 1 SPRAY INTO BOTH NOSTRILS 2 (TWO) TIMES DAILY. USE IN EACH NOSTRIL AS DIRECTED   budesonide-formoterol (SYMBICORT) 160-4.5 MCG/ACT inhaler Inhale 2 puffs into the lungs 2 (two) times daily.   EPINEPHrine 0.3 mg/0.3 mL IJ SOAJ injection Inject into the muscle as directed.   escitalopram (LEXAPRO) 20 MG tablet TAKE 1 TABLET BY MOUTH  DAILY   levocetirizine (XYZAL) 5 MG tablet Take 5 mg by mouth every evening.   levothyroxine (SYNTHROID) 112 MCG tablet TAKE 1 TABLET BY MOUTH  DAILY   methylPREDNISolone (MEDROL DOSEPAK) 4 MG TBPK tablet Medrol dosepak.  6 day taper.  Take as directed.   montelukast (SINGULAIR) 10 MG tablet TAKE 1 TABLET BY MOUTH AT  BEDTIME   omeprazole (PRILOSEC) 40 MG capsule TAKE 1 CAPSULE  BY MOUTH  DAILY   pravastatin (PRAVACHOL) 10 MG tablet TAKE 1 TABLET BY MOUTH AT  NIGHT   zolpidem (AMBIEN CR) 6.25 MG CR tablet Take 1 tablet (6.25 mg total) by mouth at bedtime as needed for sleep.   zolpidem (AMBIEN) 10 MG tablet Take 1 tablet by mouth at bedtime as needed.   No facility-administered encounter medications on file as of 09/19/2021.     Lab Results  Component Value Date   WBC 7.8 05/05/2021   HGB 12.7 05/05/2021   HCT 38.3 05/05/2021   PLT 270.0 05/05/2021   GLUCOSE 103 (H) 05/05/2021   CHOL 195 05/05/2021   TRIG 109.0 05/05/2021   HDL 49.30 05/05/2021   LDLDIRECT 157.7 04/21/2013   LDLCALC 123 (H) 05/05/2021   ALT 19 05/05/2021   AST 18 05/05/2021   NA 140 05/05/2021   K 4.2 05/05/2021   CL 102 05/05/2021   CREATININE 0.85 05/05/2021   BUN 13 05/05/2021   CO2 33 (H) 05/05/2021   TSH 0.88 05/05/2021    US Abdomen Complete  Result Date: 12/17/2019 CLINICAL DATA:  Gallstones, bilateral flank pain. EXAM: ABDOMEN ULTRASOUND COMPLETE COMPARISON:  Abdominal ultrasound 09/24/2016 FINDINGS: Gallbladder: There is a shadowing gallstone measuring 1.2 cm. No gallbladder wall thickening. No sonographic Murphy sign noted by sonographer. Common bile duct: Diameter: 0.4 cm, within normal limits. Liver: No focal lesion identified. Diffusely increased liver parenchymal echogenicity. Portal vein is patent on color Doppler imaging with normal direction of blood flow towards the liver. IVC: No abnormality visualized. Pancreas: Visualized portion unremarkable. Spleen: Size and appearance within normal limits.  Small splenule. Right Kidney: Length: 11.3 cm. Echogenicity within normal limits. No mass or hydronephrosis visualized. Left Kidney: Length: 11.0 cm. Echogenicity within normal limits. No mass or hydronephrosis visualized. Abdominal aorta: No aneurysm visualized. Other findings: None. IMPRESSION: 1.  Cholelithiasis without evidence of cholecystitis. 2. Diffusely increased liver  parenchymal echogenicity most commonly seen with hepatic steatosis. Electronically Signed   By: Emmaline Kluver M.D.   On: 12/17/2019 11:17       Assessment & Plan:   Problem List Items Addressed This Visit   None    Dale Colby, MD

## 2021-09-20 ENCOUNTER — Other Ambulatory Visit: Payer: Self-pay | Admitting: Internal Medicine

## 2021-09-20 ENCOUNTER — Encounter: Payer: Self-pay | Admitting: Internal Medicine

## 2021-09-20 ENCOUNTER — Other Ambulatory Visit: Payer: Self-pay

## 2021-09-20 DIAGNOSIS — H9319 Tinnitus, unspecified ear: Secondary | ICD-10-CM | POA: Insufficient documentation

## 2021-09-20 DIAGNOSIS — H938X9 Other specified disorders of ear, unspecified ear: Secondary | ICD-10-CM | POA: Insufficient documentation

## 2021-09-20 MED ORDER — CIPROFLOXACIN-DEXAMETHASONE 0.3-0.1 % OT SUSP
4.0000 [drp] | Freq: Two times a day (BID) | OTIC | 0 refills | Status: DC
Start: 2021-09-20 — End: 2022-02-06

## 2021-09-20 NOTE — Assessment & Plan Note (Signed)
Persistent issue with ringing in her ears.  Refer to ENT for evaluation.

## 2021-09-20 NOTE — Progress Notes (Signed)
Patient ID: Bethany Cook, female   DOB: 12/08/1965, 56 y.o.   MRN: [id number]   Virtual Visit via video Note   All issues noted in this document were discussed and addressed.  No physical exam was performed (except for noted visual exam findings with Video Visits).   I connected with Bethany Cook by a video enabled telemedicine application and verified that I am speaking with the correct person using two identifiers. Location patient: home Location provider: work  Persons participating in the virtual visit: patient, provider  The limitations, risks, security and privacy concerns of performing an evaluation and management service by video and the availability of in person appointments have been discussed.  It has also been discussed with the patient that there may be a patient responsible charge related to this service. The patient expressed understanding and agreed to proceed.   Reason for visit: work in appt  HPI: Work in with concerns regarding persistent ear fullness and decreased hearing.  States she went to the beach two weeks ago and after returning - developed sore throat and ear popping.  Moved into her chest.  Developed a cough - mucus - blood tinged.  Saw Dr [place] Callas - treated with zpak and prednisone.  Is doing better.  No sinus pressure now.  Dry cough.  No chest congestion, chest pain or sob.  No wheezing.  Canal felt swollen.  Ear full.  Started on ciprodex.  Swelling may be a little better.  Still full and notices discomfort.  Decreased hearing. No fever.  No headache.     ROS: See pertinent positives and negatives per HPI.  Past Medical History:  Diagnosis Date   Anemia    iron deficient   Asthma    Change in bowel movement 12/21/2016   Cholelithiasis 10/27/2016   Dizziness 12/26/2014   Environmental allergies    Family history of colon cancer 04/25/2013   Colonoscopy 11/18/14 - diverticulosis (sigmoid - mild).  No colorectal neoplasia.  Recommend f/u colonoscopy in five  years.     Fatty liver 10/27/2016   GERD (gastroesophageal reflux disease) 04/25/2013   Health care maintenance 04/23/2014   Hypercholesterolemia    Hyperthyroidism    s/p ablation   Hypothyroidism 01/27/2012   Lung nodule 10/27/2016   Viral upper respiratory illness 05/30/2014    Past Surgical History:  Procedure Laterality Date   NO PAST SURGERIES      Family History  Problem Relation Age of Onset   Colon cancer Father    Colon cancer Paternal Uncle        x4   Asthma Other        cousine   Breast cancer Neg Hx     SOCIAL HX: reviewed.    Current Outpatient Medications:    albuterol (PROVENTIL HFA;VENTOLIN HFA) 108 (90 Base) MCG/ACT inhaler, Inhale 1 puff into the lungs every 6 (six) hours as needed for wheezing or shortness of breath., Disp: 21 Inhaler, Rfl: 3   albuterol (PROVENTIL) (2.5 MG/3ML) 0.083% nebulizer solution, Take 3 mLs (2.5 mg total) by nebulization every 6 (six) hours as needed for wheezing or shortness of breath., Disp: 150 mL, Rfl: 1   ALPRAZolam (XANAX) 0.25 MG tablet, Take 0.25 mg by mouth every 8 (eight) hours as needed., Disp: , Rfl:    amoxicillin-clavulanate (AUGMENTIN) 875-125 MG tablet, Take 1 tablet by mouth 2 (two) times daily., Disp: 14 tablet, Rfl: 0   Azelastine HCl 137 MCG/SPRAY SOLN, PLACE 1 SPRAY INTO BOTH NOSTRILS 2 (  TWO) TIMES DAILY. USE IN EACH NOSTRIL AS DIRECTED, Disp: 30 mL, Rfl: 1   budesonide-formoterol (SYMBICORT) 160-4.5 MCG/ACT inhaler, Inhale 2 puffs into the lungs 2 (two) times daily., Disp: , Rfl:    EPINEPHrine 0.3 mg/0.3 mL IJ SOAJ injection, Inject into the muscle as directed., Disp: , Rfl:    escitalopram (LEXAPRO) 20 MG tablet, TAKE 1 TABLET BY MOUTH  DAILY, Disp: 90 tablet, Rfl: 3   levocetirizine (XYZAL) 5 MG tablet, Take 5 mg by mouth every evening., Disp: , Rfl:    levothyroxine (SYNTHROID) 112 MCG tablet, TAKE 1 TABLET BY MOUTH  DAILY, Disp: 90 tablet, Rfl: 3   methylPREDNISolone (MEDROL DOSEPAK) 4 MG TBPK tablet, Medrol  dosepak.  6 day taper.  Take as directed., Disp: 21 tablet, Rfl: 0   montelukast (SINGULAIR) 10 MG tablet, TAKE 1 TABLET BY MOUTH AT  BEDTIME, Disp: 90 tablet, Rfl: 3   omeprazole (PRILOSEC) 40 MG capsule, TAKE 1 CAPSULE BY MOUTH  DAILY, Disp: 90 capsule, Rfl: 3   pravastatin (PRAVACHOL) 10 MG tablet, TAKE 1 TABLET BY MOUTH AT  NIGHT, Disp: 90 tablet, Rfl: 3   zolpidem (AMBIEN CR) 6.25 MG CR tablet, Take 1 tablet (6.25 mg total) by mouth at bedtime as needed for sleep., Disp: 30 tablet, Rfl: 3   zolpidem (AMBIEN) 10 MG tablet, Take 1 tablet by mouth at bedtime as needed., Disp: , Rfl:   EXAM:  GENERAL: alert, oriented, appears well and in no acute distress  HEENT: atraumatic, conjunttiva clear, no obvious abnormalities on inspection of external nose and ears  NECK: normal movements of the head and neck  LUNGS: on inspection no signs of respiratory distress, breathing rate appears normal, no obvious gross SOB, gasping or wheezing  CV: no obvious cyanosis  PSYCH/NEURO: pleasant and cooperative, no obvious depression or anxiety, speech and thought processing grossly intact  ASSESSMENT AND PLAN:  Discussed the following assessment and plan:  Problem List Items Addressed This Visit     Asthma    Continue symbicort, singulair and albuterol.   Doing well on current regimen.  Breathing stable. Follow.        Ear fullness    Recent cough, congestion and earache/fullness and decreased hearing.  Recently treated with zpak and prednisone. Cough and congestion improved.  No sinus pressure.  Does have residual ear fullness and discomfort.  Using ciprodex.  Will treat with augmentin as directed.  Discussed the need to take probiotics.  Afrin nasal spray and steroid nasal spray as directed.  Has noticed persistent ringing in her ear.  Refer to ENT for evaluation.        Relevant Orders   Ambulatory referral to ENT   Tinnitus    Persistent issue with ringing in her ears.  Refer to ENT for  evaluation.       Relevant Orders   Ambulatory referral to ENT    Return if symptoms worsen or fail to improve, for keep scheduled.   I discussed the assessment and treatment plan with the patient. The patient was provided an opportunity to ask questions and all were answered. The patient agreed with the plan and demonstrated an understanding of the instructions.   The patient was advised to call back or seek an in-person evaluation if the symptoms worsen or if the condition fails to improve as anticipated.   Dale Boyds, MD

## 2021-09-20 NOTE — Assessment & Plan Note (Signed)
Recent cough, congestion and earache/fullness and decreased hearing.  Recently treated with zpak and prednisone. Cough and congestion improved.  No sinus pressure.  Does have residual ear fullness and discomfort.  Using ciprodex.  Will treat with augmentin as directed.  Discussed the need to take probiotics.  Afrin nasal spray and steroid nasal spray as directed.  Has noticed persistent ringing in her ear.  Refer to ENT for evaluation.

## 2021-09-20 NOTE — Assessment & Plan Note (Signed)
Continue symbicort, singulair and albuterol.   Doing well on current regimen.  Breathing stable. Follow.   

## 2021-09-22 ENCOUNTER — Ambulatory Visit (HOSPITAL_COMMUNITY): Admit: 2021-09-22 | Discharge status: Against Medical Advice | Payer: Commercial Managed Care - PPO

## 2021-09-22 NOTE — Telephone Encounter (Signed)
S/w pt - new sx of headache and worsening hearing loss. Agreeable to be seen at [facility] for eval  Pt will go today.

## 2021-09-22 NOTE — Telephone Encounter (Signed)
Please let her know that I am out of the office today and beginning of next week.   If the symptoms are not improving with the ciprodex, then I would hold on another ear drop right now.  If she is having persistent symptoms and now headache - she needs to be reevaluated.  If unable to get earlier appt with ENT, then I would recommend evaluation today.

## 2021-09-25 IMAGING — CR DG LUMBAR SPINE COMPLETE 4+V
5 series · 5 of 5 positions shown · non-contrast
Comparison: None

CLINICAL DATA: Slipped and fell down three steps this morning,
RIGHT-sided low back pain radiating to RIGHT hip

EXAM:
LUMBAR SPINE - COMPLETE 4+ VIEW

[l-spine ap]
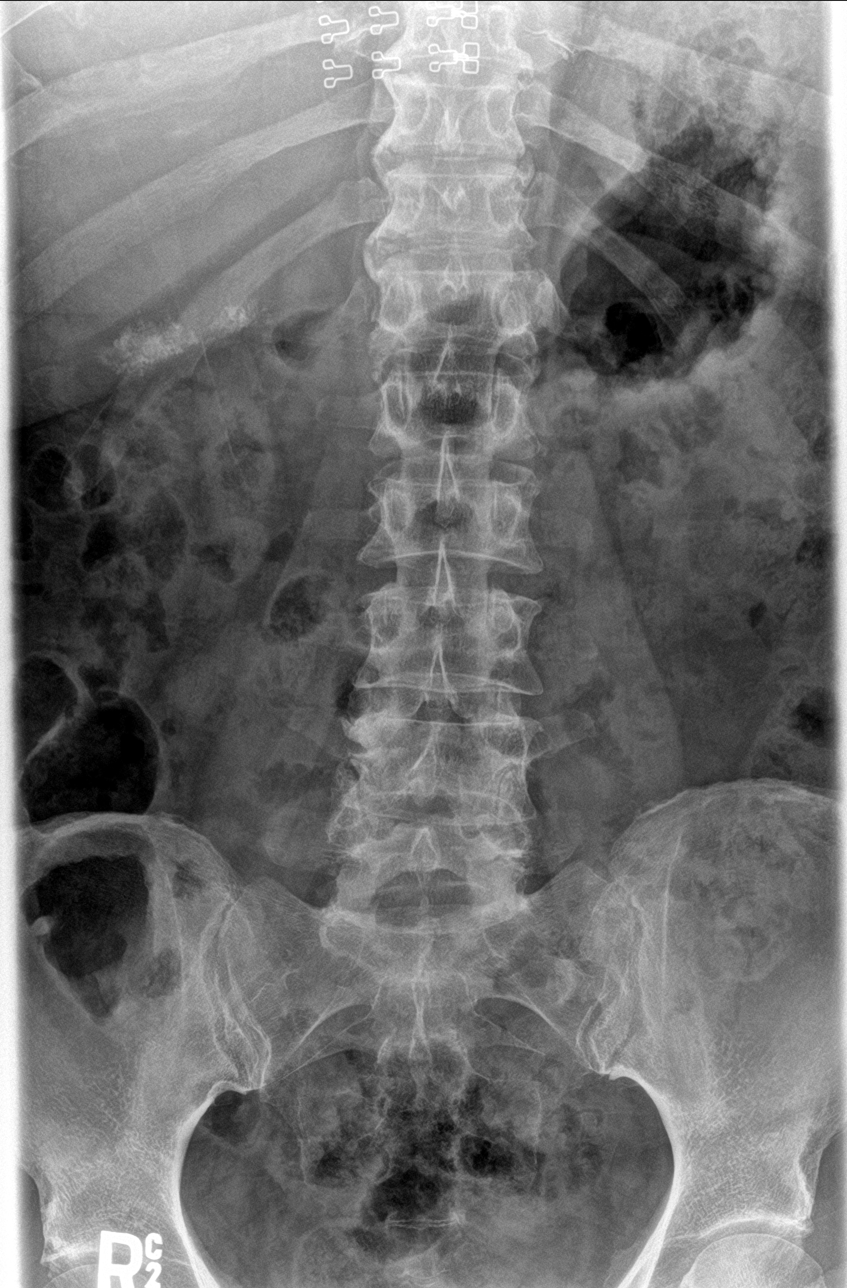

[l-spine obl (1 of 2)]
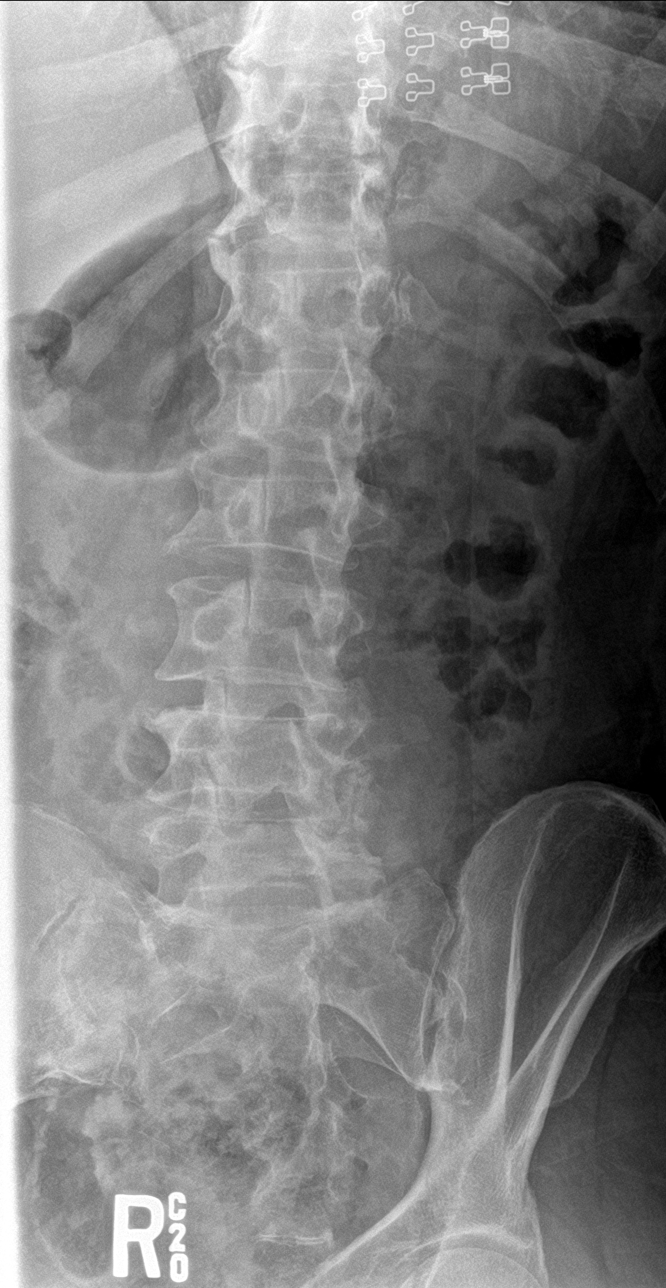

[l-spine obl (2 of 2)]
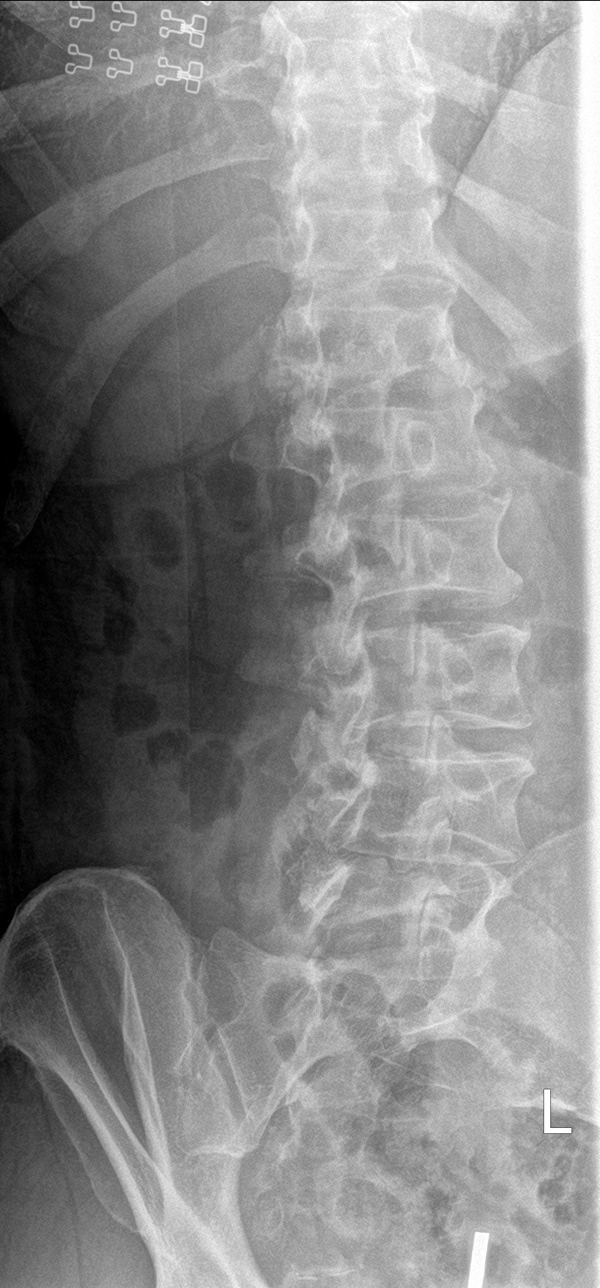

[l-spine lat]
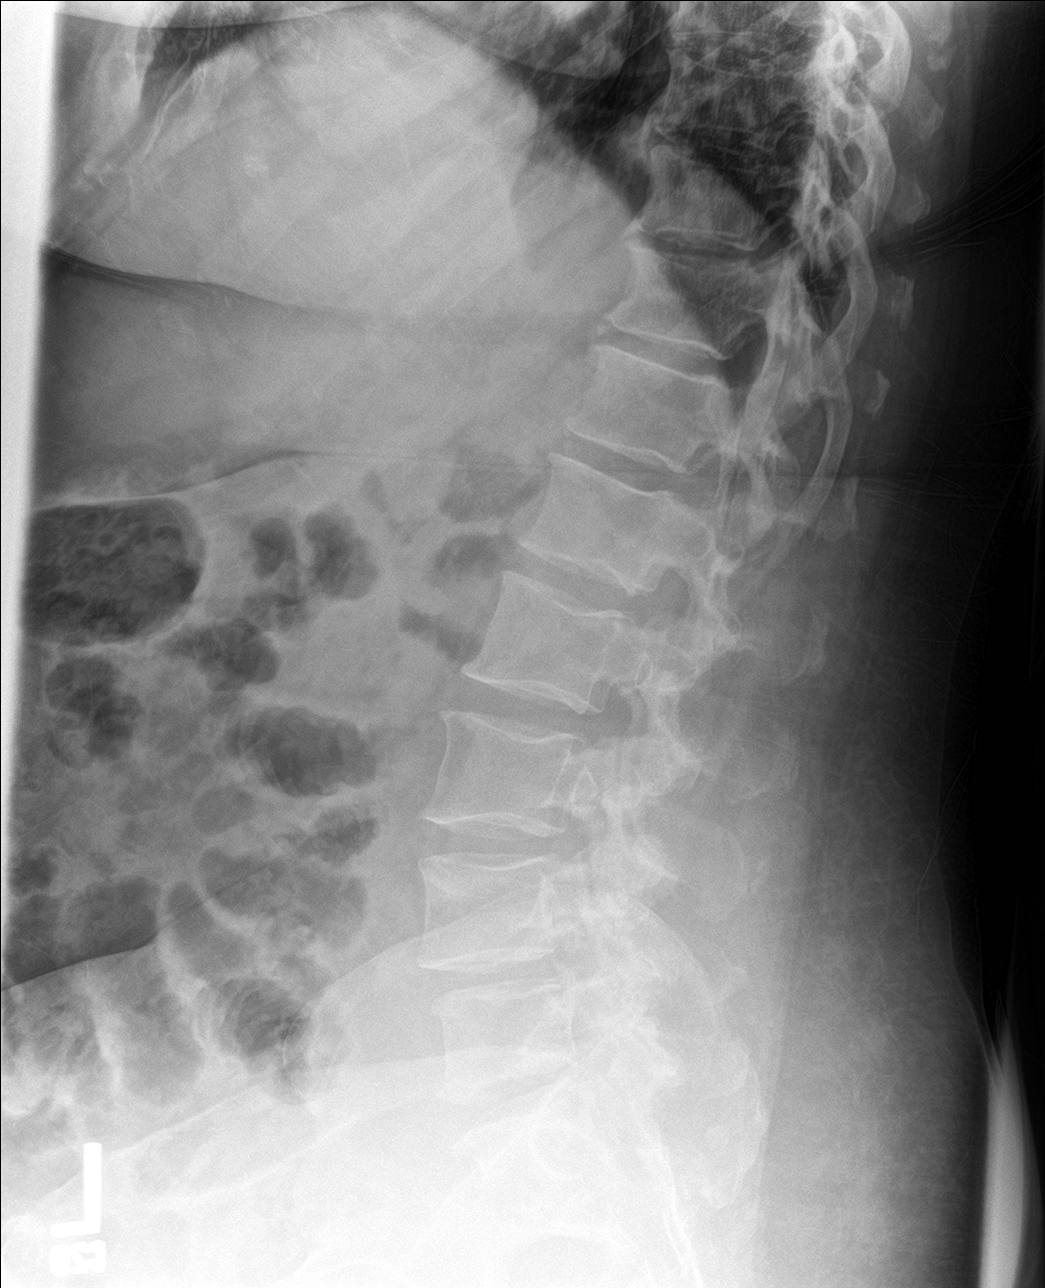

[l-spine spot]
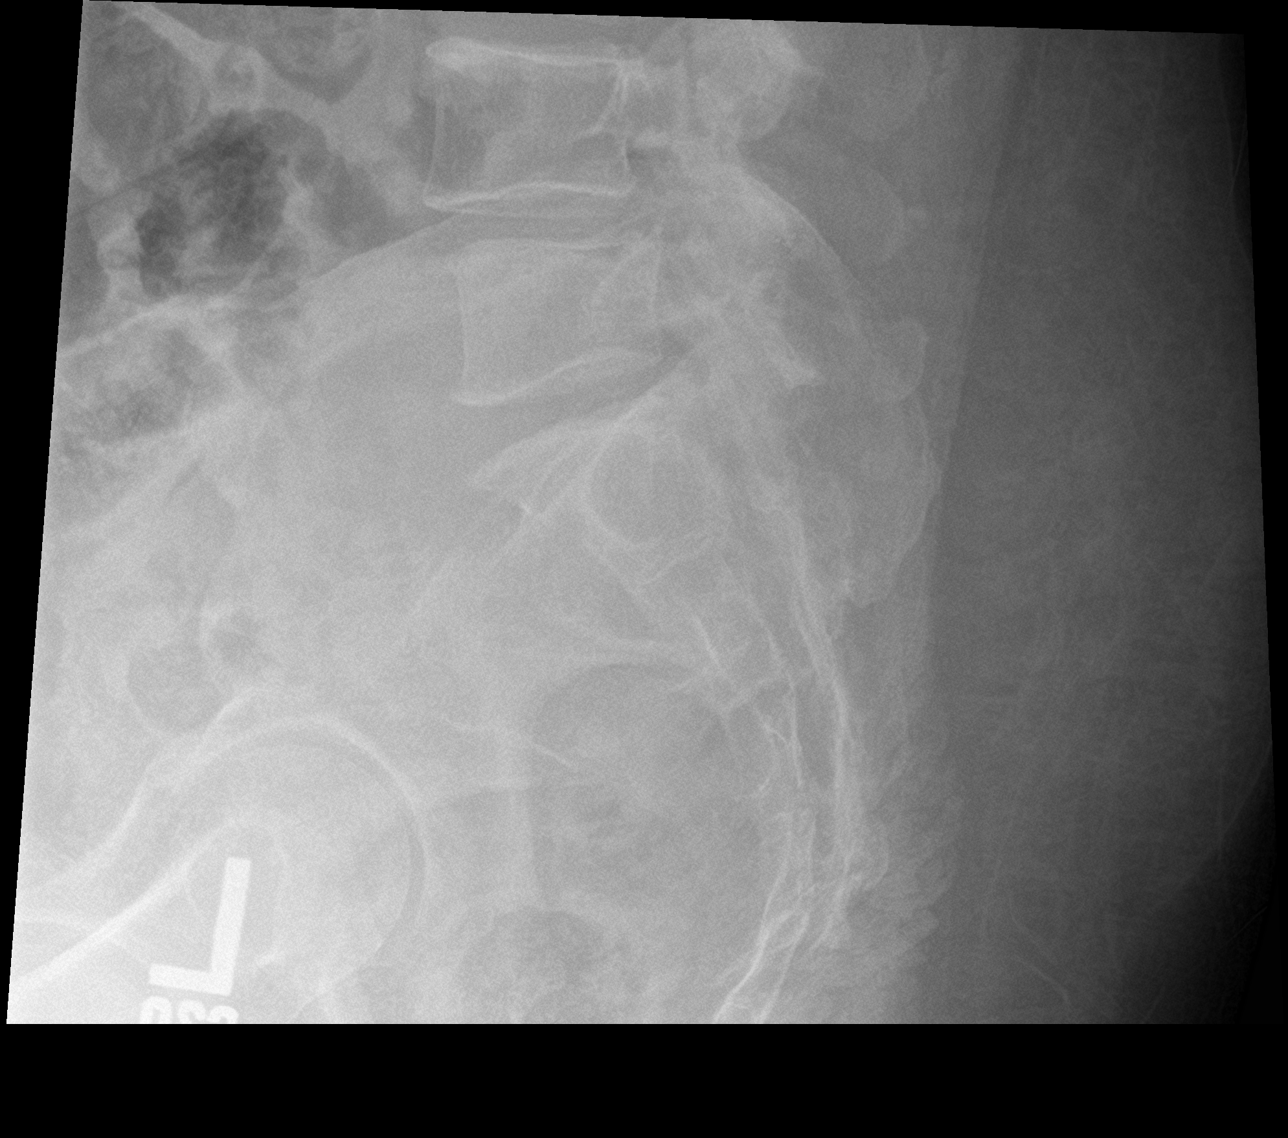

[5 of 5 positions shown; findings below may reference images not displayed]

FINDINGS: Osseous demineralization.

Hypoplastic last rib pair.

Five non-rib-bearing lumbar vertebra.

Minimal disc space narrowing and anterolisthesis L4-L5 due to a
combination of degenerative disc and facet disease.

Vertebral body heights maintained without fracture or additional
subluxation.

No bone destruction or spondylolysis.

SI joints preserved.

Amorphous calcific density in the RIGHT upper quadrant, could
represent radiodense medication, less likely milk of calcium within
gallbladder.
IMPRESSION: Degenerative disc and facet disease changes L4-L5 with minimal
anterolisthesis.

No acute osseous abnormalities.

Amorphous calcific density RIGHT upper quadrant question radiodense
medication in bowel versus milk of calcium within gallbladder.

## 2021-10-29 ENCOUNTER — Other Ambulatory Visit: Payer: Self-pay | Admitting: Pulmonary Disease

## 2021-10-31 NOTE — Telephone Encounter (Signed)
Zolpidem renewed 

## 2021-11-15 ENCOUNTER — Ambulatory Visit: Payer: Commercial Managed Care - PPO | Admitting: Internal Medicine

## 2021-11-24 ENCOUNTER — Other Ambulatory Visit: Payer: Self-pay | Admitting: Internal Medicine

## 2021-12-02 ENCOUNTER — Other Ambulatory Visit: Payer: Self-pay | Admitting: Internal Medicine

## 2022-01-04 ENCOUNTER — Ambulatory Visit: Payer: Commercial Managed Care - PPO | Admitting: Internal Medicine

## 2022-01-04 ENCOUNTER — Encounter: Payer: Self-pay | Admitting: Internal Medicine

## 2022-01-04 ENCOUNTER — Ambulatory Visit (INDEPENDENT_AMBULATORY_CARE_PROVIDER_SITE_OTHER): Payer: Commercial Managed Care - PPO

## 2022-01-04 VITALS — BP 138/80 | HR 88 | Temp 98.1°F | Ht 63.0 in | Wt 232.2 lb

## 2022-01-04 DIAGNOSIS — D649 Anemia, unspecified: Secondary | ICD-10-CM | POA: Diagnosis not present

## 2022-01-04 DIAGNOSIS — K219 Gastro-esophageal reflux disease without esophagitis: Secondary | ICD-10-CM

## 2022-01-04 DIAGNOSIS — R002 Palpitations: Secondary | ICD-10-CM | POA: Diagnosis not present

## 2022-01-04 DIAGNOSIS — E039 Hypothyroidism, unspecified: Secondary | ICD-10-CM

## 2022-01-04 DIAGNOSIS — J4520 Mild intermittent asthma, uncomplicated: Secondary | ICD-10-CM

## 2022-01-04 DIAGNOSIS — E7800 Pure hypercholesterolemia, unspecified: Secondary | ICD-10-CM | POA: Diagnosis not present

## 2022-01-04 DIAGNOSIS — M546 Pain in thoracic spine: Secondary | ICD-10-CM | POA: Diagnosis not present

## 2022-01-04 DIAGNOSIS — F439 Reaction to severe stress, unspecified: Secondary | ICD-10-CM

## 2022-01-04 DIAGNOSIS — K760 Fatty (change of) liver, not elsewhere classified: Secondary | ICD-10-CM

## 2022-01-04 DIAGNOSIS — G4733 Obstructive sleep apnea (adult) (pediatric): Secondary | ICD-10-CM

## 2022-01-04 NOTE — Patient Instructions (Signed)
Restart 1/2 20mg  lexapro

## 2022-01-04 NOTE — Progress Notes (Signed)
Patient ID: Bethany Cook, female   DOB: October 23, 1965, 56 y.o.   MRN: [id number]   Subjective:    Patient ID: Bethany Cook, female    DOB: [birth year], 56 y.o.   MRN: [id number]   Patient here for  Chief Complaint  Patient presents with   Follow-up    3 mnths   .   HPI Here to follow up regarding increased cholesterol, asthma and allergies.  Reports her breathing is relatively stable.  No increased cough or congestion reported.  Does report pain - mid back.  Persistent.  Has noticed some palpitations.  No increased acid reflux reported.  Does report some previous loose stool  not loose lately.  Some previous BRB.  Discussed increased stress.  Discussed lexapro.    Past Medical History:  Diagnosis Date   Anemia    iron deficient   Asthma    Change in bowel movement 12/21/2016   Cholelithiasis 10/27/2016   Dizziness 12/26/2014   Environmental allergies    Family history of colon cancer 04/25/2013   Colonoscopy 11/18/14 - diverticulosis (sigmoid - mild).  No colorectal neoplasia.  Recommend f/u colonoscopy in five years.     Fatty liver 10/27/2016   GERD (gastroesophageal reflux disease) 04/25/2013   Health care maintenance 04/23/2014   Hypercholesterolemia    Hyperthyroidism    s/p ablation   Hypothyroidism 01/27/2012   Lung nodule 10/27/2016   Viral upper respiratory illness 05/30/2014   Past Surgical History:  Procedure Laterality Date   NO PAST SURGERIES     Family History  Problem Relation Age of Onset   Colon cancer Father    Colon cancer Paternal Uncle        x4   Asthma Other        cousine   Breast cancer Neg Hx    Social History   Socioeconomic History   Marital status: Single    Spouse name: Not on file   Number of children: 0   Years of education: Not on file   Highest education level: Not on file  Occupational History   Not on file  Tobacco Use   Smoking status: Never   Smokeless tobacco: Never  Vaping Use   Vaping Use: Never used  Substance and Sexual  Activity   Alcohol use: Yes    Alcohol/week: 0.0 standard drinks of alcohol    Comment: occasional   Drug use: No   Sexual activity: Not on file  Other Topics Concern   Not on file  Social History Narrative   Not on file   Social Determinants of Health   Financial Resource Strain: Not on file  Food Insecurity: Not on file  Transportation Needs: Not on file  Physical Activity: Not on file  Stress: Not on file  Social Connections: Not on file     Review of Systems  Constitutional:  Negative for appetite change and unexpected weight change.  HENT:  Negative for congestion and sinus pressure.   Respiratory:  Negative for cough and chest tightness.        Breathing stable.   Cardiovascular:  Positive for palpitations. Negative for chest pain and leg swelling.  Gastrointestinal:  Negative for abdominal pain, diarrhea, nausea and vomiting.  Genitourinary:  Negative for difficulty urinating and dysuria.  Musculoskeletal:  Negative for joint swelling and myalgias.  Skin:  Negative for color change and rash.  Neurological:  Negative for dizziness and headaches.  Psychiatric/Behavioral:  Negative for agitation and dysphoric mood.  Increased stress as outlined.        Objective:     BP 138/80 (BP Location: Left Arm, Patient Position: Sitting, Cuff Size: Normal)   Pulse 88   Temp 98.1 F (36.7 C) (Oral)   Ht 5\' 3"  (1.6 m)   Wt 232 lb 3.2 oz (105.3 kg)   LMP  (LMP Unknown)   SpO2 98%   BMI 41.13 kg/m  Wt Readings from Last 3 Encounters:  01/04/22 232 lb 3.2 oz (105.3 kg)  08/15/21 225 lb 6.4 oz (102.2 kg)  06/21/21 225 lb (102.1 kg)    Physical Exam Vitals reviewed.  Constitutional:      General: She is not in acute distress.    Appearance: Normal appearance.  HENT:     Head: Normocephalic and atraumatic.     Right Ear: External ear normal.     Left Ear: External ear normal.  Eyes:     General: No scleral icterus.       Right eye: No discharge.        Left  eye: No discharge.     Conjunctiva/sclera: Conjunctivae normal.  Neck:     Thyroid: No thyromegaly.  Cardiovascular:     Rate and Rhythm: Normal rate and regular rhythm.  Pulmonary:     Effort: No respiratory distress.     Breath sounds: Normal breath sounds. No wheezing.  Abdominal:     General: Bowel sounds are normal.     Palpations: Abdomen is soft.     Tenderness: There is no abdominal tenderness.  Musculoskeletal:        General: No swelling or tenderness.     Cervical back: Neck supple. No tenderness.  Lymphadenopathy:     Cervical: No cervical adenopathy.  Skin:    Findings: No erythema or rash.  Neurological:     Mental Status: She is alert.  Psychiatric:        Mood and Affect: Mood normal.        Behavior: Behavior normal.      Outpatient Encounter Medications as of 01/04/2022  Medication Sig   albuterol (PROVENTIL HFA;VENTOLIN HFA) 108 (90 Base) MCG/ACT inhaler Inhale 1 puff into the lungs every 6 (six) hours as needed for wheezing or shortness of breath.   albuterol (PROVENTIL) (2.5 MG/3ML) 0.083% nebulizer solution Take 3 mLs (2.5 mg total) by nebulization every 6 (six) hours as needed for wheezing or shortness of breath.   ALPRAZolam (XANAX) 0.25 MG tablet Take 0.25 mg by mouth every 8 (eight) hours as needed.   amoxicillin-clavulanate (AUGMENTIN) 875-125 MG tablet Take 1 tablet by mouth 2 (two) times daily.   Azelastine HCl 137 MCG/SPRAY SOLN PLACE 1 SPRAY INTO BOTH NOSTRILS 2 (TWO) TIMES DAILY. USE IN EACH NOSTRIL AS DIRECTED   budesonide-formoterol (SYMBICORT) 160-4.5 MCG/ACT inhaler Inhale 2 puffs into the lungs 2 (two) times daily.   ciprofloxacin-dexamethasone (CIPRODEX) OTIC suspension Place 4 drops into both ears 2 (two) times daily.   EPINEPHrine 0.3 mg/0.3 mL IJ SOAJ injection Inject into the muscle as directed.   escitalopram (LEXAPRO) 20 MG tablet TAKE 1 TABLET BY MOUTH  DAILY   levocetirizine (XYZAL) 5 MG tablet Take 5 mg by mouth every evening.    levothyroxine (SYNTHROID) 112 MCG tablet TAKE 1 TABLET BY MOUTH DAILY   methylPREDNISolone (MEDROL DOSEPAK) 4 MG TBPK tablet Medrol dosepak.  6 day taper.  Take as directed.   montelukast (SINGULAIR) 10 MG tablet TAKE 1 TABLET BY MOUTH AT  BEDTIME  omeprazole (PRILOSEC) 40 MG capsule TAKE 1 CAPSULE BY MOUTH  DAILY   zolpidem (AMBIEN CR) 6.25 MG CR tablet TAKE 1 TABLET BY MOUTH AT BEDTIME AS NEEDED FOR SLEEP.   [DISCONTINUED] pravastatin (PRAVACHOL) 10 MG tablet TAKE 1 TABLET BY MOUTH AT  NIGHT   No facility-administered encounter medications on file as of 01/04/2022.     Lab Results  Component Value Date   WBC 9.9 01/10/2022   HGB 12.8 01/10/2022   HCT 39.3 01/10/2022   PLT 334.0 01/10/2022   GLUCOSE 125 (H) 01/10/2022   CHOL 176 01/10/2022   TRIG 106.0 01/10/2022   HDL 49.60 01/10/2022   LDLDIRECT 157.7 04/21/2013   LDLCALC 105 (H) 01/10/2022   ALT 18 01/10/2022   AST 16 01/10/2022   NA 138 01/10/2022   K 4.1 01/10/2022   CL 100 01/10/2022   CREATININE 0.86 01/10/2022   BUN 12 01/10/2022   CO2 28 01/10/2022   TSH 0.73 01/10/2022    US Abdomen Complete  Result Date: 12/17/2019 CLINICAL DATA:  Gallstones, bilateral flank pain. EXAM: ABDOMEN ULTRASOUND COMPLETE COMPARISON:  Abdominal ultrasound 09/24/2016 FINDINGS: Gallbladder: There is a shadowing gallstone measuring 1.2 cm. No gallbladder wall thickening. No sonographic Murphy sign noted by sonographer. Common bile duct: Diameter: 0.4 cm, within normal limits. Liver: No focal lesion identified. Diffusely increased liver parenchymal echogenicity. Portal vein is patent on color Doppler imaging with normal direction of blood flow towards the liver. IVC: No abnormality visualized. Pancreas: Visualized portion unremarkable. Spleen: Size and appearance within normal limits.  Small splenule. Right Kidney: Length: 11.3 cm. Echogenicity within normal limits. No mass or hydronephrosis visualized. Left Kidney: Length: 11.0 cm. Echogenicity  within normal limits. No mass or hydronephrosis visualized. Abdominal aorta: No aneurysm visualized. Other findings: None. IMPRESSION: 1.  Cholelithiasis without evidence of cholecystitis. 2. Diffusely increased liver parenchymal echogenicity most commonly seen with hepatic steatosis. Electronically Signed   By: Emmaline Kluver M.D.   On: 12/17/2019 11:17       Assessment & Plan:   Problem List Items Addressed This Visit     Anemia    Follow cbc.       Relevant Orders   CBC with Differential/Platelet (Completed)   Asthma    Continue symbicort, singulair and albuterol.   Doing well on current regimen.  Breathing stable. Follow.        Back pain - Primary    Persistent mid back pain.  Check xray.        Relevant Orders   DG Thoracic Spine 2 View (Completed)   Urinalysis, Routine w reflex microscopic (Completed)   Fatty liver    Low carb diet and exercise.  Follow liver function tests.        GERD (gastroesophageal reflux disease)    No upper symptoms reported.  On prilosec.        Hypercholesterolemia    On pravastatin.  Low cholesterol diet and exercise.  Follow lipid panel and liver function tests.        Relevant Orders   Basic metabolic panel (Completed)   Hepatic function panel (Completed)   Lipid panel (Completed)   Hypothyroidism    On thyroid medication.  Follow tsh.        Relevant Orders   TSH (Completed)   OSA (obstructive sleep apnea)    Continue cpap.       Palpitations    Noticed palpitations. Persistent intermittent.  Treat anxiety.  EKG - SR increased artifact.  No acute  ischemic change noted.  Given persistent intermittent notice, will have cardiology evaluate - with question of need for monitor, echo, etc.        Relevant Orders   EKG 12-Lead (Completed)   Ambulatory referral to Cardiology   Stress    Lexapro.  Discussed.  Follow.         Dale Highfill, MD

## 2022-01-08 ENCOUNTER — Encounter (INDEPENDENT_AMBULATORY_CARE_PROVIDER_SITE_OTHER): Payer: Self-pay

## 2022-01-10 ENCOUNTER — Other Ambulatory Visit (INDEPENDENT_AMBULATORY_CARE_PROVIDER_SITE_OTHER): Payer: Commercial Managed Care - PPO

## 2022-01-10 DIAGNOSIS — E7800 Pure hypercholesterolemia, unspecified: Secondary | ICD-10-CM | POA: Diagnosis not present

## 2022-01-10 DIAGNOSIS — E039 Hypothyroidism, unspecified: Secondary | ICD-10-CM

## 2022-01-10 DIAGNOSIS — D649 Anemia, unspecified: Secondary | ICD-10-CM | POA: Diagnosis not present

## 2022-01-10 DIAGNOSIS — M546 Pain in thoracic spine: Secondary | ICD-10-CM | POA: Diagnosis not present

## 2022-01-10 LAB — URINALYSIS, ROUTINE W REFLEX MICROSCOPIC
Bilirubin Urine: NEGATIVE
Hgb urine dipstick: NEGATIVE
Ketones, ur: NEGATIVE
Leukocytes,Ua: NEGATIVE
Nitrite: NEGATIVE
RBC / HPF: NONE SEEN (ref 0–?)
Specific Gravity, Urine: <=1.005 — AB (ref 1.000–1.030)
Total Protein, Urine: NEGATIVE
Urine Glucose: NEGATIVE
Urobilinogen, UA: 0.2 (ref 0.0–1.0)
pH: 6 (ref 5.0–8.0)

## 2022-01-10 LAB — LIPID PANEL
Cholesterol: 176 mg/dL (ref 0–200)
HDL: 49.6 mg/dL (ref >39.00)
LDL Cholesterol: 105 mg/dL — ABNORMAL HIGH (ref 0–99)
NonHDL: 126.54
Total CHOL/HDL Ratio: 4
Triglycerides: 106 mg/dL (ref 0.0–149.0)
VLDL: 21.2 mg/dL (ref 0.0–40.0)

## 2022-01-10 LAB — HEPATIC FUNCTION PANEL
ALT: 18 U/L (ref 0–35)
AST: 16 U/L (ref 0–37)
Albumin: 4.3 g/dL (ref 3.5–5.2)
Alkaline Phosphatase: 80 U/L (ref 39–117)
Bilirubin, Direct: 0.1 mg/dL (ref 0.0–0.3)
Total Bilirubin: 0.4 mg/dL (ref 0.2–1.2)
Total Protein: 7.6 g/dL (ref 6.0–8.3)

## 2022-01-10 LAB — CBC WITH DIFFERENTIAL/PLATELET
Basophils Absolute: 0.1 10*3/uL (ref 0.0–0.1)
Basophils Relative: 0.7 % (ref 0.0–3.0)
Eosinophils Absolute: 0.3 10*3/uL (ref 0.0–0.7)
Eosinophils Relative: 2.6 % (ref 0.0–5.0)
HCT: 39.3 % (ref 36.0–46.0)
Hemoglobin: 12.8 g/dL (ref 12.0–15.0)
Lymphocytes Relative: 25.5 % (ref 12.0–46.0)
Lymphs Abs: 2.5 10*3/uL (ref 0.7–4.0)
MCHC: 32.4 g/dL (ref 30.0–36.0)
MCV: 83 fl (ref 78.0–100.0)
Monocytes Absolute: 0.7 10*3/uL (ref 0.1–1.0)
Monocytes Relative: 7.4 % (ref 3.0–12.0)
Neutro Abs: 6.3 10*3/uL (ref 1.4–7.7)
Neutrophils Relative %: 63.8 % (ref 43.0–77.0)
Platelets: 334 10*3/uL (ref 150.0–400.0)
RBC: 4.74 Mil/uL (ref 3.87–5.11)
RDW: 14.3 % (ref 11.5–15.5)
WBC: 9.9 10*3/uL (ref 4.0–10.5)

## 2022-01-10 LAB — BASIC METABOLIC PANEL
BUN: 12 mg/dL (ref 6–23)
CO2: 28 mEq/L (ref 19–32)
Calcium: 9.5 mg/dL (ref 8.4–10.5)
Chloride: 100 mEq/L (ref 96–112)
Creatinine, Ser: 0.86 mg/dL (ref 0.40–1.20)
GFR: 74.46 mL/min (ref >60.00)
Glucose, Bld: 125 mg/dL — ABNORMAL HIGH (ref 70–99)
Potassium: 4.1 mEq/L (ref 3.5–5.1)
Sodium: 138 mEq/L (ref 135–145)

## 2022-01-10 LAB — TSH: TSH: 0.73 u[IU]/mL (ref 0.35–5.50)

## 2022-01-11 ENCOUNTER — Encounter: Payer: Self-pay | Admitting: Internal Medicine

## 2022-01-11 ENCOUNTER — Other Ambulatory Visit: Payer: Self-pay

## 2022-01-11 MED ORDER — PRAVASTATIN SODIUM 20 MG PO TABS: 20.0000 mg | ORAL_TABLET | Freq: Every day | ORAL | 1 refills | Status: DC

## 2022-01-12 NOTE — Telephone Encounter (Signed)
Lm for pt to cb.

## 2022-01-12 NOTE — Telephone Encounter (Signed)
Notify - we will follow her sugar.  Have her follow low carb diet and exercise.  Schedule for fasting glucose and A1c (dx hyperglycemia) in 3-4 weeks.

## 2022-01-14 ENCOUNTER — Encounter: Payer: Self-pay | Admitting: Internal Medicine

## 2022-01-14 NOTE — Assessment & Plan Note (Signed)
Continue symbicort, singulair and albuterol.   Doing well on current regimen.  Breathing stable. Follow.   

## 2022-01-14 NOTE — Assessment & Plan Note (Signed)
No upper symptoms reported. On prilosec.  

## 2022-01-14 NOTE — Assessment & Plan Note (Signed)
On pravastatin.  Low cholesterol diet and exercise.  Follow lipid panel and liver function tests.   

## 2022-01-14 NOTE — Assessment & Plan Note (Signed)
Follow cbc.  

## 2022-01-14 NOTE — Assessment & Plan Note (Signed)
Continue cpap.  

## 2022-01-14 NOTE — Assessment & Plan Note (Signed)
Persistent mid back pain.  Check xray.

## 2022-01-14 NOTE — Assessment & Plan Note (Signed)
On thyroid medication.  Follow tsh.  

## 2022-01-14 NOTE — Assessment & Plan Note (Signed)
Noticed palpitations. Persistent intermittent.  Treat anxiety.  EKG - SR increased artifact.  No acute ischemic change noted.  Given persistent intermittent notice, will have cardiology evaluate - with question of need for monitor, echo, etc.

## 2022-01-14 NOTE — Assessment & Plan Note (Signed)
Low carb diet and exercise.  Follow liver function tests.   

## 2022-01-14 NOTE — Assessment & Plan Note (Signed)
Lexapro.  Discussed.  Follow.  

## 2022-01-23 ENCOUNTER — Other Ambulatory Visit: Payer: Self-pay

## 2022-01-23 DIAGNOSIS — R739 Hyperglycemia, unspecified: Secondary | ICD-10-CM

## 2022-02-05 NOTE — Progress Notes (Unsigned)
Cardiology Office Note:    Date:  02/06/2022   ID:  DEDRICK KOTAS, DOB 1965-07-20, MRN [id number]  PCP:  Einar Pheasant, MD  Cardiologist:  None   Referring MD: Einar Pheasant, MD   No chief complaint on file.   History of Present Illness:    Bethany Cook is a 56 y.o. female with a hx of palpitations.   She has at least a 1 year history of intermittent palpitations.  She describes it variably as heart flipping, heart will go, and on occasion racing associated with shortness of breath.  Never had lightheadedness or dizziness or syncope with it.  She denies angina.  She is able to lie flat without dyspnea.  She has obstructive sleep apnea and wears CPAP.  She does not smoke.  She drinks alcohol on occasion.  Past Medical History:  Diagnosis Date   Anemia    iron deficient   Asthma    Change in bowel movement 12/21/2016   Cholelithiasis 10/27/2016   Dizziness 12/26/2014   Environmental allergies    Family history of colon cancer 04/25/2013   Colonoscopy 11/18/14 - diverticulosis (sigmoid - mild).  No colorectal neoplasia.  Recommend f/u colonoscopy in five years.     Fatty liver 10/27/2016   GERD (gastroesophageal reflux disease) 04/25/2013   Health care maintenance 04/23/2014   Hypercholesterolemia    Hyperthyroidism    s/p ablation   Hypothyroidism 01/27/2012   Lung nodule 10/27/2016   Viral upper respiratory illness 05/30/2014    Past Surgical History:  Procedure Laterality Date   NO PAST SURGERIES      Current Medications: Current Meds  Medication Sig   albuterol (PROVENTIL) (2.5 MG/3ML) 0.083% nebulizer solution Take 3 mLs (2.5 mg total) by nebulization every 6 (six) hours as needed for wheezing or shortness of breath.   ALPRAZolam (XANAX) 0.25 MG tablet Take 0.25 mg by mouth every 8 (eight) hours as needed.   Azelastine HCl 137 MCG/SPRAY SOLN PLACE 1 SPRAY INTO BOTH NOSTRILS 2 (TWO) TIMES DAILY. USE IN EACH NOSTRIL AS DIRECTED   budesonide-formoterol (SYMBICORT)  160-4.5 MCG/ACT inhaler Inhale 2 puffs into the lungs 2 (two) times daily.   EPINEPHrine 0.3 mg/0.3 mL IJ SOAJ injection Inject into the muscle as directed.   escitalopram (LEXAPRO) 20 MG tablet TAKE 1 TABLET BY MOUTH  DAILY   levocetirizine (XYZAL) 5 MG tablet Take 5 mg by mouth every evening.   levothyroxine (SYNTHROID) 112 MCG tablet TAKE 1 TABLET BY MOUTH DAILY   montelukast (SINGULAIR) 10 MG tablet TAKE 1 TABLET BY MOUTH AT  BEDTIME   omeprazole (PRILOSEC) 40 MG capsule TAKE 1 CAPSULE BY MOUTH  DAILY   pravastatin (PRAVACHOL) 20 MG tablet Take 1 tablet (20 mg total) by mouth daily.   zolpidem (AMBIEN CR) 6.25 MG CR tablet TAKE 1 TABLET BY MOUTH AT BEDTIME AS NEEDED FOR SLEEP.     Allergies:   Erythromycin   Social History   Socioeconomic History   Marital status: Single    Spouse name: Not on file   Number of children: 0   Years of education: Not on file   Highest education level: Not on file  Occupational History   Not on file  Tobacco Use   Smoking status: Never   Smokeless tobacco: Never  Vaping Use   Vaping Use: Never used  Substance and Sexual Activity   Alcohol use: Yes    Alcohol/week: 0.0 standard drinks of alcohol    Comment: occasional  Drug use: No   Sexual activity: Not on file  Other Topics Concern   Not on file  Social History Narrative   Not on file   Social Determinants of Health   Financial Resource Strain: Not on file  Food Insecurity: Not on file  Transportation Needs: Not on file  Physical Activity: Not on file  Stress: Not on file  Social Connections: Not on file     Family History: The patient's family history includes Asthma in an other family member; Colon cancer in her father and paternal uncle. There is no history of Breast cancer.  ROS:   Please see the history of present illness.    Tearful when we spoke about her grandmother who died of heart complication.  Her mother had atrial fibrillation.  All other systems reviewed and are  negative.  EKGs/Labs/Other Studies Reviewed:    The following studies were reviewed today: No cardiac evaluation  EKG:  EKG performed 01/04/2022, revealed normal sinus rhythm with baseline artifact.  Recent Labs: 01/10/2022: ALT 18; BUN 12; Creatinine, Ser 0.86; Hemoglobin 12.8; Platelets 334.0; Potassium 4.1; Sodium 138; TSH 0.73  Recent Lipid Panel    Component Value Date/Time   CHOL 176 01/10/2022 0818   TRIG 106.0 01/10/2022 0818   HDL 49.60 01/10/2022 0818   CHOLHDL 4 01/10/2022 0818   VLDL 21.2 01/10/2022 0818   LDLCALC 105 (H) 01/10/2022 0818   LDLDIRECT 157.7 04/21/2013 0807    Physical Exam:    VS:  BP 120/70   Pulse 74   Ht 5\' 3"  (1.6 m)   Wt 228 lb (103.4 kg)   SpO2 99%   BMI 40.39 kg/m     Wt Readings from Last 3 Encounters:  02/06/22 228 lb (103.4 kg)  01/04/22 232 lb 3.2 oz (105.3 kg)  08/15/21 225 lb 6.4 oz (102.2 kg)     GEN: Overweight BMI of 40. No acute distress HEENT: Normal NECK: No JVD. LYMPHATICS: No lymphadenopathy CARDIAC: No murmur. RRR no gallop, or edema. VASCULAR:  Normal Pulses. No bruits. RESPIRATORY:  Clear to auscultation without rales, wheezing or rhonchi  ABDOMEN: Soft, non-tender, non-distended, No pulsatile mass, MUSCULOSKELETAL: No deformity  SKIN: Warm and dry NEUROLOGIC:  Alert and oriented x 3 PSYCHIATRIC:  Normal affect   ASSESSMENT:    1. Palpitations   2. OSA (obstructive sleep apnea)   3. Hypercholesterolemia    PLAN:    In order of problems listed above:  14-day monitor to exclude atrial fibrillation or other consequential arrhythmias. Encouraged CPAP. LDL is 105.  Grandmother had coronary disease.  If we see significant arrhythmia, especially ventricular in nature, she will need to have an ischemic assessment possibly including a coronary calcium score.   Medication Adjustments/Labs and Tests Ordered: Current medicines are reviewed at length with the patient today.  Concerns regarding medicines are  outlined above.  Orders Placed This Encounter  Procedures   LONG TERM MONITOR (3-14 DAYS)   No orders of the defined types were placed in this encounter.   Patient Instructions  Medication Instructions:  Your physician recommends that you continue on your current medications as directed. Please refer to the Current Medication list given to you today.  *If you need a refill on your cardiac medications before your next appointment, please call your pharmacy*  Testing/Procedures: Your physician has requested that you wear a Zio heart monitor for 14 days. This will be mailed to your home with instructions on how to apply the monitor and how  to return it when finished. Please allow 2 weeks after returning the heart monitor before our office calls you with the results.   Follow-Up: As needed  Other Instructions ZIO XT- Long Term Monitor Instructions     Your physician has requested you wear a ZIO patch monitor for 14 days.  This is a single patch monitor. [employer] supplies one patch monitor per enrollment. Additional  stickers are not available. Please do not apply patch if you will be having a Nuclear Stress Test,  Echocardiogram, Cardiac CT, MRI, or Chest Xray during the period you would be wearing the  monitor. The patch cannot be worn during these tests. You cannot remove and re-apply the  ZIO XT patch monitor.  Your ZIO patch monitor will be mailed 3 day USPS to your address on file. It may take 3-5 days  to receive your monitor after you have been enrolled.  Once you have received your monitor, please review the enclosed instructions. Your monitor  has already been registered assigning a specific monitor serial # to you.     Billing and Patient Assistance Program Information     We have supplied [employer] with any of your insurance information on file for billing purposes.  [employer] offers a sliding scale Patient Assistance Program for patients that do not have  insurance, or whose  insurance does not completely cover the cost of the ZIO monitor.  You must apply for the Patient Assistance Program to qualify for this discounted rate.  To apply, please call [employer] at [phone], select option 4, select option 2, ask to apply for  Patient Assistance Program. Meredeth Ide will ask your household income, and how many people  are in your household. They will quote your out-of-pocket cost based on that information.  [employer] will also be able to set up a 54-month, interest-free payment plan if needed.     Applying the monitor     Shave hair from upper left chest.  Hold abrader disc by orange tab. Rub abrader in 40 strokes over the upper left chest as  indicated in your monitor instructions.  Clean area with 4 enclosed alcohol pads. Let dry.  Apply patch as indicated in monitor instructions. Patch will be placed under collarbone on left  side of chest with arrow pointing upward.  Rub patch adhesive wings for 2 minutes. Remove white label marked "1". Remove the white  label marked "2". Rub patch adhesive wings for 2 additional minutes.  While looking in a mirror, press and release button in center of patch. A small green light will  flash 3-4 times. This will be your only indicator that the monitor has been turned on.  Do not shower for the first 24 hours. You may shower after the first 24 hours.  Press the button if you feel a symptom. You will hear a small click. Record Date, Time and  Symptom in the Patient Logbook.  When you are ready to remove the patch, follow instructions on the last 2 pages of Patient  Logbook. Stick patch monitor onto the last page of Patient Logbook.  Place Patient Logbook in the blue and white box. Use locking tab on box and tape box closed  securely. The blue and white box has prepaid postage on it. Please place it in the mailbox as  soon as possible. Your physician should have your test results approximately 7 days after the  monitor has been mailed  back to [facility].  Call [facility]  at [phone] if you have questions regarding  your ZIO XT patch monitor. Call them immediately if you see an orange light blinking on your  monitor.  If your monitor falls off in less than 4 days, contact our Monitor department at [phone].  If your monitor becomes loose or falls off after 4 days call [employer] at [phone] for  suggestions on securing your monitor.     Important Information About Sugar         Signed, Sinclair Grooms, MD  02/06/2022 3:30 PM    [place]

## 2022-02-06 ENCOUNTER — Ambulatory Visit: Payer: Commercial Managed Care - PPO | Attending: Interventional Cardiology | Admitting: Interventional Cardiology

## 2022-02-06 ENCOUNTER — Ambulatory Visit (INDEPENDENT_AMBULATORY_CARE_PROVIDER_SITE_OTHER): Payer: Commercial Managed Care - PPO

## 2022-02-06 ENCOUNTER — Encounter: Payer: Self-pay | Admitting: Interventional Cardiology

## 2022-02-06 VITALS — BP 120/70 | HR 74 | Ht 63.0 in | Wt 228.0 lb

## 2022-02-06 DIAGNOSIS — E7800 Pure hypercholesterolemia, unspecified: Secondary | ICD-10-CM | POA: Diagnosis not present

## 2022-02-06 DIAGNOSIS — R002 Palpitations: Secondary | ICD-10-CM

## 2022-02-06 DIAGNOSIS — G4733 Obstructive sleep apnea (adult) (pediatric): Secondary | ICD-10-CM

## 2022-02-06 NOTE — Patient Instructions (Signed)
Medication Instructions:  Your physician recommends that you continue on your current medications as directed. Please refer to the Current Medication list given to you today.  *If you need a refill on your cardiac medications before your next appointment, please call your pharmacy*  Testing/Procedures: Your physician has requested that you wear a Zio heart monitor for 14 days. This will be mailed to your home with instructions on how to apply the monitor and how to return it when finished. Please allow 2 weeks after returning the heart monitor before our office calls you with the results.   Follow-Up: As needed  Other Instructions ZIO XT- Long Term Monitor Instructions     Your physician has requested you wear a ZIO patch monitor for 14 days.  This is a single patch monitor. [employer] supplies one patch monitor per enrollment. Additional  stickers are not available. Please do not apply patch if you will be having a Nuclear Stress Test,  Echocardiogram, Cardiac CT, MRI, or Chest Xray during the period you would be wearing the  monitor. The patch cannot be worn during these tests. You cannot remove and re-apply the  ZIO XT patch monitor.  Your ZIO patch monitor will be mailed 3 day USPS to your address on file. It may take 3-5 days  to receive your monitor after you have been enrolled.  Once you have received your monitor, please review the enclosed instructions. Your monitor  has already been registered assigning a specific monitor serial # to you.     Billing and Patient Assistance Program Information     We have supplied [employer] with any of your insurance information on file for billing purposes.  [employer] offers a sliding scale Patient Assistance Program for patients that do not have  insurance, or whose insurance does not completely cover the cost of the ZIO monitor.  You must apply for the Patient Assistance Program to qualify for this discounted rate.  To apply, please call [employer]  at [phone], select option 4, select option 2, ask to apply for  Patient Assistance Program. Meredeth Ide will ask your household income, and how many people  are in your household. They will quote your out-of-pocket cost based on that information.  [employer] will also be able to set up a 22-month, interest-free payment plan if needed.     Applying the monitor     Shave hair from upper left chest.  Hold abrader disc by orange tab. Rub abrader in 40 strokes over the upper left chest as  indicated in your monitor instructions.  Clean area with 4 enclosed alcohol pads. Let dry.  Apply patch as indicated in monitor instructions. Patch will be placed under collarbone on left  side of chest with arrow pointing upward.  Rub patch adhesive wings for 2 minutes. Remove white label marked "1". Remove the white  label marked "2". Rub patch adhesive wings for 2 additional minutes.  While looking in a mirror, press and release button in center of patch. A small green light will  flash 3-4 times. This will be your only indicator that the monitor has been turned on.  Do not shower for the first 24 hours. You may shower after the first 24 hours.  Press the button if you feel a symptom. You will hear a small click. Record Date, Time and  Symptom in the Patient Logbook.  When you are ready to remove the patch, follow instructions on the last 2 pages of Patient  Logbook. Stick patch  monitor onto the last page of Patient Logbook.  Place Patient Logbook in the blue and white box. Use locking tab on box and tape box closed  securely. The blue and white box has prepaid postage on it. Please place it in the mailbox as  soon as possible. Your physician should have your test results approximately 7 days after the  monitor has been mailed back to [facility].  Call [facility] Customer Care at [phone] if you have questions regarding  your ZIO XT patch monitor. Call them immediately if you see an orange light  blinking on your  monitor.  If your monitor falls off in less than 4 days, contact our Monitor department at [phone].  If your monitor becomes loose or falls off after 4 days call [employer] at [phone] for  suggestions on securing your monitor.     Important Information About Sugar

## 2022-02-06 NOTE — Progress Notes (Unsigned)
Enrolled patient for a 14 day Zio XT  monitor to be mailed to patients home  °

## 2022-02-07 ENCOUNTER — Other Ambulatory Visit: Payer: Commercial Managed Care - PPO

## 2022-02-07 ENCOUNTER — Other Ambulatory Visit (INDEPENDENT_AMBULATORY_CARE_PROVIDER_SITE_OTHER): Payer: Commercial Managed Care - PPO

## 2022-02-07 DIAGNOSIS — R739 Hyperglycemia, unspecified: Secondary | ICD-10-CM

## 2022-02-07 LAB — GLUCOSE, RANDOM: Glucose, Bld: 116 mg/dL — ABNORMAL HIGH (ref 70–99)

## 2022-02-07 LAB — HEMOGLOBIN A1C: Hgb A1c MFr Bld: 6.4 % (ref 4.6–6.5)

## 2022-02-09 DIAGNOSIS — R002 Palpitations: Secondary | ICD-10-CM | POA: Diagnosis not present

## 2022-02-20 ENCOUNTER — Ambulatory Visit: Payer: Commercial Managed Care - PPO | Admitting: Internal Medicine

## 2022-03-29 ENCOUNTER — Other Ambulatory Visit: Payer: Self-pay | Admitting: Pulmonary Disease

## 2022-03-30 NOTE — Telephone Encounter (Signed)
Please advise on refill request

## 2022-04-11 ENCOUNTER — Ambulatory Visit: Payer: Commercial Managed Care - PPO | Admitting: Internal Medicine

## 2022-04-11 ENCOUNTER — Encounter: Payer: Self-pay | Admitting: Internal Medicine

## 2022-04-11 VITALS — BP 130/70 | HR 69 | Temp 97.9°F | Resp 16 | Ht 63.0 in | Wt 224.8 lb

## 2022-04-11 DIAGNOSIS — R002 Palpitations: Secondary | ICD-10-CM

## 2022-04-11 DIAGNOSIS — D649 Anemia, unspecified: Secondary | ICD-10-CM | POA: Diagnosis not present

## 2022-04-11 DIAGNOSIS — K219 Gastro-esophageal reflux disease without esophagitis: Secondary | ICD-10-CM

## 2022-04-11 DIAGNOSIS — Z9109 Other allergy status, other than to drugs and biological substances: Secondary | ICD-10-CM

## 2022-04-11 DIAGNOSIS — E039 Hypothyroidism, unspecified: Secondary | ICD-10-CM

## 2022-04-11 DIAGNOSIS — F439 Reaction to severe stress, unspecified: Secondary | ICD-10-CM

## 2022-04-11 DIAGNOSIS — K760 Fatty (change of) liver, not elsewhere classified: Secondary | ICD-10-CM

## 2022-04-11 DIAGNOSIS — J4520 Mild intermittent asthma, uncomplicated: Secondary | ICD-10-CM | POA: Diagnosis not present

## 2022-04-11 DIAGNOSIS — Z800 Family history of malignant neoplasm of digestive organs: Secondary | ICD-10-CM

## 2022-04-11 DIAGNOSIS — E7800 Pure hypercholesterolemia, unspecified: Secondary | ICD-10-CM | POA: Diagnosis not present

## 2022-04-11 DIAGNOSIS — R739 Hyperglycemia, unspecified: Secondary | ICD-10-CM

## 2022-04-11 DIAGNOSIS — G4733 Obstructive sleep apnea (adult) (pediatric): Secondary | ICD-10-CM

## 2022-04-11 MED ORDER — METFORMIN HCL ER 500 MG PO TB24: 500.0000 mg | ORAL_TABLET | Freq: Every day | ORAL | 1 refills | Status: DC

## 2022-04-11 NOTE — Progress Notes (Signed)
Subjective:    Patient ID: Bethany Cook, female    DOB: [birth year], 57 y.o.   MRN: [id number]  Patient here for  Chief Complaint  Patient presents with   Medical Management of Chronic Issues    HPI Here to follow up regarding hypercholesterolemia, asthma and allergies and increased stress.  Last visit, reported intermittent palpitations.  Saw cardiology 02/06/22 - recommended zio monitor - nonsustained SVT.  No afib. Appears to be doing well from a cardiac standpoint.  No chest pain or sob reported.  No increased heart rate or palpitations reported.  Breathing overall stable.  Discussed acid.  Discussed pepcid.  Issues previously - diarrhea.  Trying to stay active.  Rejoined gym.  Discussed labs.  Discussed A1c.  Desires to start metformin.  Recent A1c 6.4.    Past Medical History:  Diagnosis Date   Anemia    iron deficient   Asthma    Change in bowel movement 12/21/2016   Cholelithiasis 10/27/2016   Dizziness 12/26/2014   Environmental allergies    Family history of colon cancer 04/25/2013   Colonoscopy 11/18/14 - diverticulosis (sigmoid - mild).  No colorectal neoplasia.  Recommend f/u colonoscopy in five years.     Fatty liver 10/27/2016   GERD (gastroesophageal reflux disease) 04/25/2013   Health care maintenance 04/23/2014   Hypercholesterolemia    Hyperthyroidism    s/p ablation   Hypothyroidism 01/27/2012   Lung nodule 10/27/2016   Viral upper respiratory illness 05/30/2014   Past Surgical History:  Procedure Laterality Date   NO PAST SURGERIES     Family History  Problem Relation Age of Onset   Colon cancer Father    Colon cancer Paternal Uncle        x4   Asthma Other        cousine   Breast cancer Neg Hx    Social History   Socioeconomic History   Marital status: Single    Spouse name: Not on file   Number of children: 0   Years of education: Not on file   Highest education level: Not on file  Occupational History   Not on file  Tobacco Use   Smoking  status: Never   Smokeless tobacco: Never  Vaping Use   Vaping Use: Never used  Substance and Sexual Activity   Alcohol use: Yes    Alcohol/week: 0.0 standard drinks of alcohol    Comment: occasional   Drug use: No   Sexual activity: Not on file  Other Topics Concern   Not on file  Social History Narrative   Not on file   Social Determinants of Health   Financial Resource Strain: Not on file  Food Insecurity: Not on file  Transportation Needs: Not on file  Physical Activity: Not on file  Stress: Not on file  Social Connections: Not on file     Review of Systems  Constitutional:  Negative for appetite change and unexpected weight change.  HENT:  Negative for congestion and sinus pressure.   Respiratory:  Negative for cough, chest tightness and shortness of breath.   Cardiovascular:  Negative for chest pain, palpitations and leg swelling.  Gastrointestinal:  Negative for abdominal pain, diarrhea, nausea and vomiting.  Genitourinary:  Negative for difficulty urinating and dysuria.  Musculoskeletal:  Negative for joint swelling and myalgias.  Skin:  Negative for color change and rash.  Neurological:  Negative for dizziness and headaches.  Psychiatric/Behavioral:  Negative for agitation and dysphoric mood.  Objective:     BP 130/70   Pulse 69   Temp 97.9 F (36.6 C)   Resp 16   Ht 5\' 3"  (1.6 m)   Wt 224 lb 12.8 oz (102 kg)   SpO2 99%   BMI 39.82 kg/m  Wt Readings from Last 3 Encounters:  04/11/22 224 lb 12.8 oz (102 kg)  02/06/22 228 lb (103.4 kg)  01/04/22 232 lb 3.2 oz (105.3 kg)    Physical Exam Vitals reviewed.  Constitutional:      General: She is not in acute distress.    Appearance: Normal appearance.  HENT:     Head: Normocephalic and atraumatic.     Right Ear: External ear normal.     Left Ear: External ear normal.  Eyes:     General: No scleral icterus.       Right eye: No discharge.        Left eye: No discharge.      Conjunctiva/sclera: Conjunctivae normal.  Neck:     Thyroid: No thyromegaly.  Cardiovascular:     Rate and Rhythm: Normal rate and regular rhythm.  Pulmonary:     Effort: No respiratory distress.     Breath sounds: Normal breath sounds. No wheezing.  Abdominal:     General: Bowel sounds are normal.     Palpations: Abdomen is soft.     Tenderness: There is no abdominal tenderness.  Musculoskeletal:        General: No swelling or tenderness.     Cervical back: Neck supple. No tenderness.  Lymphadenopathy:     Cervical: No cervical adenopathy.  Skin:    Findings: No erythema or rash.  Neurological:     Mental Status: She is alert.  Psychiatric:        Mood and Affect: Mood normal.        Behavior: Behavior normal.      Outpatient Encounter Medications as of 04/11/2022  Medication Sig   metFORMIN (GLUCOPHAGE-XR) 500 MG 24 hr tablet Take 1 tablet (500 mg total) by mouth daily with breakfast.   albuterol (PROVENTIL) (2.5 MG/3ML) 0.083% nebulizer solution Take 3 mLs (2.5 mg total) by nebulization every 6 (six) hours as needed for wheezing or shortness of breath.   ALPRAZolam (XANAX) 0.25 MG tablet Take 0.25 mg by mouth every 8 (eight) hours as needed.   Azelastine HCl 137 MCG/SPRAY SOLN PLACE 1 SPRAY INTO BOTH NOSTRILS 2 (TWO) TIMES DAILY. USE IN EACH NOSTRIL AS DIRECTED   budesonide-formoterol (SYMBICORT) 160-4.5 MCG/ACT inhaler Inhale 2 puffs into the lungs 2 (two) times daily.   EPINEPHrine 0.3 mg/0.3 mL IJ SOAJ injection Inject into the muscle as directed.   escitalopram (LEXAPRO) 20 MG tablet TAKE 1 TABLET BY MOUTH  DAILY   levocetirizine (XYZAL) 5 MG tablet Take 5 mg by mouth every evening.   levothyroxine (SYNTHROID) 112 MCG tablet TAKE 1 TABLET BY MOUTH DAILY   montelukast (SINGULAIR) 10 MG tablet TAKE 1 TABLET BY MOUTH AT  BEDTIME   omeprazole (PRILOSEC) 40 MG capsule TAKE 1 CAPSULE BY MOUTH  DAILY   pravastatin (PRAVACHOL) 20 MG tablet Take 1 tablet (20 mg total) by mouth  daily.   zolpidem (AMBIEN CR) 6.25 MG CR tablet TAKE 1 TABLET BY MOUTH EVERY DAY AT BEDTIME AS NEEDED FOR SLEEP   No facility-administered encounter medications on file as of 04/11/2022.     Lab Results  Component Value Date   WBC 9.9 01/10/2022   HGB 12.8 01/10/2022  HCT 39.3 01/10/2022   PLT 334.0 01/10/2022   GLUCOSE 116 (H) 02/07/2022   CHOL 176 01/10/2022   TRIG 106.0 01/10/2022   HDL 49.60 01/10/2022   LDLDIRECT 157.7 04/21/2013   LDLCALC 105 (H) 01/10/2022   ALT 18 01/10/2022   AST 16 01/10/2022   NA 138 01/10/2022   K 4.1 01/10/2022   CL 100 01/10/2022   CREATININE 0.86 01/10/2022   BUN 12 01/10/2022   CO2 28 01/10/2022   TSH 0.73 01/10/2022   HGBA1C 6.4 02/07/2022    US Abdomen Complete  Result Date: 12/17/2019 CLINICAL DATA:  Gallstones, bilateral flank pain. EXAM: ABDOMEN ULTRASOUND COMPLETE COMPARISON:  Abdominal ultrasound 09/24/2016 FINDINGS: Gallbladder: There is a shadowing gallstone measuring 1.2 cm. No gallbladder wall thickening. No sonographic Murphy sign noted by sonographer. Common bile duct: Diameter: 0.4 cm, within normal limits. Liver: No focal lesion identified. Diffusely increased liver parenchymal echogenicity. Portal vein is patent on color Doppler imaging with normal direction of blood flow towards the liver. IVC: No abnormality visualized. Pancreas: Visualized portion unremarkable. Spleen: Size and appearance within normal limits.  Small splenule. Right Kidney: Length: 11.3 cm. Echogenicity within normal limits. No mass or hydronephrosis visualized. Left Kidney: Length: 11.0 cm. Echogenicity within normal limits. No mass or hydronephrosis visualized. Abdominal aorta: No aneurysm visualized. Other findings: None. IMPRESSION: 1.  Cholelithiasis without evidence of cholecystitis. 2. Diffusely increased liver parenchymal echogenicity most commonly seen with hepatic steatosis. Electronically Signed   By: Audie Pinto M.D.   On: 12/17/2019 11:17        Assessment & Plan:  Hypercholesterolemia Assessment & Plan: On pravastatin.  Low cholesterol diet and exercise.  Follow lipid panel and liver function tests.    Orders: -     Lipid panel; Future -     Hepatic function panel; Future -     Basic metabolic panel; Future  Hyperglycemia -     Hemoglobin A1c; Future  Mild intermittent asthma without complication Assessment & Plan: Continue symbicort, singulair and albuterol.   Doing well on current regimen.  Breathing stable. Follow.     Anemia, unspecified type Assessment & Plan: Follow cbc.    Environmental allergies Assessment & Plan: Continue singulair and xyzal.  Stable.    Family history of colon cancer Assessment & Plan: Colonoscopy 04/20/20 - tubular adenomas (4). Hyperplastic polyp.  Recommended f/u colonoscopy in 3 years.  [facility])   Fatty liver Assessment & Plan: Low carb diet and exercise.  Follow liver function tests.     Gastroesophageal reflux disease, unspecified whether esophagitis present Assessment & Plan: On prilosec.  Discussed adding pepcid.    Hypothyroidism, unspecified type Assessment & Plan: On thyroid medication.  Follow tsh.     OSA (obstructive sleep apnea) Assessment & Plan: Continue cpap.    Palpitations Assessment & Plan: Saw cardiology 02/06/22 - recommended zio monitor - nonsustained SVT.  No afib. Appears to be doing well from a cardiac standpoint.  No chest pain or sob reported.  No increased heart rate or palpitations reported.  Breathing overall stable.    Stress Assessment & Plan: Lexapro.  Discussed.  Follow.    Other orders -     metFORMIN HCl ER; Take 1 tablet (500 mg total) by mouth daily with breakfast.  Dispense: 90 tablet; Refill: 1     Einar Pheasant, MD

## 2022-04-14 ENCOUNTER — Encounter: Payer: Self-pay | Admitting: Internal Medicine

## 2022-04-14 NOTE — Assessment & Plan Note (Signed)
Lexapro.  Discussed.  Follow.

## 2022-04-14 NOTE — Assessment & Plan Note (Signed)
On thyroid medication.  Follow tsh.  

## 2022-04-14 NOTE — Assessment & Plan Note (Signed)
Continue cpap.  

## 2022-04-14 NOTE — Assessment & Plan Note (Signed)
Saw cardiology 02/06/22 - recommended zio monitor - nonsustained SVT.  No afib. Appears to be doing well from a cardiac standpoint.  No chest pain or sob reported.  No increased heart rate or palpitations reported.  Breathing overall stable.

## 2022-04-14 NOTE — Assessment & Plan Note (Signed)
Continue symbicort, singulair and albuterol.   Doing well on current regimen.  Breathing stable. Follow.

## 2022-04-14 NOTE — Assessment & Plan Note (Signed)
Continue singulair and xyzal.  Stable.

## 2022-04-14 NOTE — Assessment & Plan Note (Addendum)
On prilosec.  Discussed adding pepcid.

## 2022-04-14 NOTE — Assessment & Plan Note (Signed)
Low carb diet and exercise.  Follow liver function tests.

## 2022-04-14 NOTE — Assessment & Plan Note (Signed)
Colonoscopy 04/20/20 - tubular adenomas (4). Hyperplastic polyp.  Recommended f/u colonoscopy in 3 years.  [facility])

## 2022-04-14 NOTE — Assessment & Plan Note (Signed)
Follow cbc.  

## 2022-04-14 NOTE — Assessment & Plan Note (Signed)
On pravastatin.  Low cholesterol diet and exercise.  Follow lipid panel and liver function tests.   

## 2022-06-27 ENCOUNTER — Other Ambulatory Visit: Payer: Self-pay | Admitting: Internal Medicine

## 2022-07-05 ENCOUNTER — Other Ambulatory Visit (INDEPENDENT_AMBULATORY_CARE_PROVIDER_SITE_OTHER): Payer: Commercial Managed Care - PPO

## 2022-07-05 DIAGNOSIS — R739 Hyperglycemia, unspecified: Secondary | ICD-10-CM

## 2022-07-05 DIAGNOSIS — E7800 Pure hypercholesterolemia, unspecified: Secondary | ICD-10-CM | POA: Diagnosis not present

## 2022-07-05 LAB — BASIC METABOLIC PANEL
BUN: 8 mg/dL (ref 6–23)
CO2: 27 mEq/L (ref 19–32)
Calcium: 9.4 mg/dL (ref 8.4–10.5)
Chloride: 103 mEq/L (ref 96–112)
Creatinine, Ser: 0.79 mg/dL (ref 0.40–1.20)
GFR: 82.17 mL/min (ref >60.00)
Glucose, Bld: 104 mg/dL — ABNORMAL HIGH (ref 70–99)
Potassium: 3.8 mEq/L (ref 3.5–5.1)
Sodium: 140 mEq/L (ref 135–145)

## 2022-07-05 LAB — HEMOGLOBIN A1C: Hgb A1c MFr Bld: 6.4 % (ref 4.6–6.5)

## 2022-07-05 LAB — HEPATIC FUNCTION PANEL
ALT: 17 U/L (ref 0–35)
AST: 14 U/L (ref 0–37)
Albumin: 4.3 g/dL (ref 3.5–5.2)
Alkaline Phosphatase: 82 U/L (ref 39–117)
Bilirubin, Direct: 0 mg/dL (ref 0.0–0.3)
Total Bilirubin: 0.4 mg/dL (ref 0.2–1.2)
Total Protein: 7.8 g/dL (ref 6.0–8.3)

## 2022-07-05 LAB — LIPID PANEL
Cholesterol: 174 mg/dL (ref 0–200)
HDL: 47.3 mg/dL (ref >39.00)
LDL Cholesterol: 104 mg/dL — ABNORMAL HIGH (ref 0–99)
NonHDL: 126.35
Total CHOL/HDL Ratio: 4
Triglycerides: 111 mg/dL (ref 0.0–149.0)
VLDL: 22.2 mg/dL (ref 0.0–40.0)

## 2022-07-09 LAB — HM DIABETES EYE EXAM

## 2022-07-10 LAB — HM MAMMOGRAPHY

## 2022-07-11 ENCOUNTER — Ambulatory Visit: Payer: Commercial Managed Care - PPO | Admitting: Internal Medicine

## 2022-07-24 ENCOUNTER — Ambulatory Visit: Payer: Commercial Managed Care - PPO | Admitting: Internal Medicine

## 2022-08-03 ENCOUNTER — Other Ambulatory Visit: Payer: Self-pay | Admitting: Internal Medicine

## 2022-08-13 ENCOUNTER — Ambulatory Visit: Payer: Commercial Managed Care - PPO | Admitting: Internal Medicine

## 2022-08-13 ENCOUNTER — Encounter: Payer: Self-pay | Admitting: Internal Medicine

## 2022-08-13 VITALS — BP 110/70 | HR 71 | Temp 98.0°F | Resp 16 | Ht 63.0 in | Wt 220.0 lb

## 2022-08-13 DIAGNOSIS — K760 Fatty (change of) liver, not elsewhere classified: Secondary | ICD-10-CM

## 2022-08-13 DIAGNOSIS — D649 Anemia, unspecified: Secondary | ICD-10-CM

## 2022-08-13 DIAGNOSIS — R739 Hyperglycemia, unspecified: Secondary | ICD-10-CM

## 2022-08-13 DIAGNOSIS — F439 Reaction to severe stress, unspecified: Secondary | ICD-10-CM

## 2022-08-13 DIAGNOSIS — G4733 Obstructive sleep apnea (adult) (pediatric): Secondary | ICD-10-CM

## 2022-08-13 DIAGNOSIS — J4520 Mild intermittent asthma, uncomplicated: Secondary | ICD-10-CM | POA: Diagnosis not present

## 2022-08-13 DIAGNOSIS — Z0000 Encounter for general adult medical examination without abnormal findings: Secondary | ICD-10-CM

## 2022-08-13 DIAGNOSIS — Z800 Family history of malignant neoplasm of digestive organs: Secondary | ICD-10-CM

## 2022-08-13 DIAGNOSIS — Z9109 Other allergy status, other than to drugs and biological substances: Secondary | ICD-10-CM

## 2022-08-13 DIAGNOSIS — Z1231 Encounter for screening mammogram for malignant neoplasm of breast: Secondary | ICD-10-CM

## 2022-08-13 DIAGNOSIS — E039 Hypothyroidism, unspecified: Secondary | ICD-10-CM

## 2022-08-13 DIAGNOSIS — E7800 Pure hypercholesterolemia, unspecified: Secondary | ICD-10-CM

## 2022-08-13 NOTE — Progress Notes (Signed)
Subjective:    Patient ID: Bethany Cook, female    DOB: November 10, 1965, 57 y.o.   MRN: [id number]  Patient here for  Chief Complaint  Patient presents with   Medical Management of Chronic Issues    HPI Here to follow up regarding hypercholesterolemia, asthma and allergies and increased stress.  Saw cardiology 02/06/22 - recommended zio monitor - nonsustained SVT.  No afib. Appears to be doing well from a cardiac standpoint.  No chest pain or sob reported.  No increased heart rate or palpitations. Last visit, started metformin.  Last A1c 6.4.  has been trying to watch her diet.  Decreased carb intake and sweets.  No abdominal pain or bowel change reported.  Has lost some weight.  Handling stress.    Past Medical History:  Diagnosis Date   Anemia    iron deficient   Asthma    Change in bowel movement 12/21/2016   Cholelithiasis 10/27/2016   Dizziness 12/26/2014   Environmental allergies    Family history of colon cancer 04/25/2013   Colonoscopy 11/18/14 - diverticulosis (sigmoid - mild).  No colorectal neoplasia.  Recommend f/u colonoscopy in five years.     Fatty liver 10/27/2016   GERD (gastroesophageal reflux disease) 04/25/2013   Health care maintenance 04/23/2014   Hypercholesterolemia    Hyperthyroidism    s/p ablation   Hypothyroidism 01/27/2012   Lung nodule 10/27/2016   Viral upper respiratory illness 05/30/2014   Past Surgical History:  Procedure Laterality Date   NO PAST SURGERIES     Family History  Problem Relation Age of Onset   Colon cancer Father    Colon cancer Paternal Uncle        x4   Asthma Other        cousine   Breast cancer Neg Hx    Social History   Socioeconomic History   Marital status: Single    Spouse name: Not on file   Number of children: 0   Years of education: Not on file   Highest education level: Not on file  Occupational History   Not on file  Tobacco Use   Smoking status: Never   Smokeless tobacco: Never  Vaping Use   Vaping Use:  Never used  Substance and Sexual Activity   Alcohol use: Yes    Alcohol/week: 0.0 standard drinks of alcohol    Comment: occasional   Drug use: No   Sexual activity: Not on file  Other Topics Concern   Not on file  Social History Narrative   Not on file   Social Determinants of Health   Financial Resource Strain: Not on file  Food Insecurity: Not on file  Transportation Needs: Not on file  Physical Activity: Not on file  Stress: Not on file  Social Connections: Not on file     Review of Systems  Constitutional:  Negative for appetite change and unexpected weight change.  HENT:  Negative for congestion and sinus pressure.   Respiratory:  Negative for cough, chest tightness and shortness of breath.   Cardiovascular:  Negative for chest pain, palpitations and leg swelling.  Gastrointestinal:  Negative for abdominal pain, diarrhea, nausea and vomiting.  Genitourinary:  Negative for difficulty urinating and dysuria.  Musculoskeletal:  Negative for joint swelling and myalgias.  Skin:  Negative for color change and rash.  Neurological:  Negative for dizziness and headaches.  Psychiatric/Behavioral:  Negative for agitation and dysphoric mood.        Objective:  BP 110/70   Pulse 71   Temp 98 F (36.7 C)   Resp 16   Ht 5\' 3"  (1.6 m)   Wt 220 lb (99.8 kg)   SpO2 99%   BMI 38.97 kg/m  Wt Readings from Last 3 Encounters:  08/13/22 220 lb (99.8 kg)  04/11/22 224 lb 12.8 oz (102 kg)  02/06/22 228 lb (103.4 kg)    Physical Exam Vitals reviewed.  Constitutional:      General: She is not in acute distress.    Appearance: Normal appearance.  HENT:     Head: Normocephalic and atraumatic.     Right Ear: External ear normal.     Left Ear: External ear normal.  Eyes:     General: No scleral icterus.       Right eye: No discharge.        Left eye: No discharge.     Conjunctiva/sclera: Conjunctivae normal.  Neck:     Thyroid: No thyromegaly.  Cardiovascular:      Rate and Rhythm: Normal rate and regular rhythm.  Pulmonary:     Effort: No respiratory distress.     Breath sounds: Normal breath sounds. No wheezing.  Abdominal:     General: Bowel sounds are normal.     Palpations: Abdomen is soft.     Tenderness: There is no abdominal tenderness.  Musculoskeletal:        General: No swelling or tenderness.     Cervical back: Neck supple. No tenderness.  Lymphadenopathy:     Cervical: No cervical adenopathy.  Skin:    Findings: No erythema or rash.  Neurological:     Mental Status: She is alert.  Psychiatric:        Mood and Affect: Mood normal.        Behavior: Behavior normal.      Outpatient Encounter Medications as of 08/13/2022  Medication Sig   albuterol (PROVENTIL) (2.5 MG/3ML) 0.083% nebulizer solution Take 3 mLs (2.5 mg total) by nebulization every 6 (six) hours as needed for wheezing or shortness of breath.   ALPRAZolam (XANAX) 0.25 MG tablet Take 0.25 mg by mouth every 8 (eight) hours as needed.   Azelastine HCl 137 MCG/SPRAY SOLN PLACE 1 SPRAY INTO BOTH NOSTRILS 2 (TWO) TIMES DAILY. USE IN EACH NOSTRIL AS DIRECTED   budesonide-formoterol (SYMBICORT) 160-4.5 MCG/ACT inhaler Inhale 2 puffs into the lungs 2 (two) times daily.   EPINEPHrine 0.3 mg/0.3 mL IJ SOAJ injection Inject into the muscle as directed.   escitalopram (LEXAPRO) 20 MG tablet TAKE 1 TABLET BY MOUTH EVERY DAY   levocetirizine (XYZAL) 5 MG tablet Take 5 mg by mouth every evening.   levothyroxine (SYNTHROID) 112 MCG tablet TAKE 1 TABLET BY MOUTH DAILY   metFORMIN (GLUCOPHAGE-XR) 500 MG 24 hr tablet Take 1 tablet (500 mg total) by mouth daily with breakfast.   montelukast (SINGULAIR) 10 MG tablet TAKE 1 TABLET BY MOUTH AT  BEDTIME   omeprazole (PRILOSEC) 40 MG capsule TAKE 1 CAPSULE BY MOUTH  DAILY   pravastatin (PRAVACHOL) 20 MG tablet Take 1 tablet (20 mg total) by mouth daily.   zolpidem (AMBIEN CR) 6.25 MG CR tablet TAKE 1 TABLET BY MOUTH EVERY DAY AT BEDTIME AS  NEEDED FOR SLEEP   No facility-administered encounter medications on file as of 08/13/2022.     Lab Results  Component Value Date   WBC 9.9 01/10/2022   HGB 12.8 01/10/2022   HCT 39.3 01/10/2022   PLT 334.0 01/10/2022  GLUCOSE 104 (H) 07/05/2022   CHOL 174 07/05/2022   TRIG 111.0 07/05/2022   HDL 47.30 07/05/2022   LDLDIRECT 157.7 04/21/2013   LDLCALC 104 (H) 07/05/2022   ALT 17 07/05/2022   AST 14 07/05/2022   NA 140 07/05/2022   K 3.8 07/05/2022   CL 103 07/05/2022   CREATININE 0.79 07/05/2022   BUN 8 07/05/2022   CO2 27 07/05/2022   TSH 0.73 01/10/2022   HGBA1C 6.4 07/05/2022    US Abdomen Complete  Result Date: 12/17/2019 CLINICAL DATA:  Gallstones, bilateral flank pain. EXAM: ABDOMEN ULTRASOUND COMPLETE COMPARISON:  Abdominal ultrasound 09/24/2016 FINDINGS: Gallbladder: There is a shadowing gallstone measuring 1.2 cm. No gallbladder wall thickening. No sonographic Murphy sign noted by sonographer. Common bile duct: Diameter: 0.4 cm, within normal limits. Liver: No focal lesion identified. Diffusely increased liver parenchymal echogenicity. Portal vein is patent on color Doppler imaging with normal direction of blood flow towards the liver. IVC: No abnormality visualized. Pancreas: Visualized portion unremarkable. Spleen: Size and appearance within normal limits.  Small splenule. Right Kidney: Length: 11.3 cm. Echogenicity within normal limits. No mass or hydronephrosis visualized. Left Kidney: Length: 11.0 cm. Echogenicity within normal limits. No mass or hydronephrosis visualized. Abdominal aorta: No aneurysm visualized. Other findings: None. IMPRESSION: 1.  Cholelithiasis without evidence of cholecystitis. 2. Diffusely increased liver parenchymal echogenicity most commonly seen with hepatic steatosis. Electronically Signed   By: Emmaline Kluver M.D.   On: 12/17/2019 11:17       Assessment & Plan:  Health care maintenance  Anemia, unspecified type Assessment &  Plan: Follow cbc.   Orders: -     CBC with Differential/Platelet; Future  Mild intermittent asthma without complication Assessment & Plan: Continue symbicort, singulair and albuterol.   Doing well on current regimen.  Breathing stable. Follow.     Encounter for screening mammogram for malignant neoplasm of breast Assessment & Plan: States she is up to date.  Received records from [facility].  Did not receive mammogram report.  Called and requested.     Environmental allergies Assessment & Plan: Continue singulair and xyzal.  Stable.    Fatty liver Assessment & Plan: Low carb diet and exercise.  Follow liver function tests.     Hypercholesterolemia Assessment & Plan: On pravastatin.  Low cholesterol diet and exercise.  Follow lipid panel and liver function tests.    Orders: -     Basic metabolic panel; Future -     Lipid panel; Future -     Hepatic function panel; Future  Hypothyroidism, unspecified type Assessment & Plan: On thyroid medication.  Follow tsh.    Orders: -     TSH; Future  OSA (obstructive sleep apnea) Assessment & Plan: Continue cpap.    Stress Assessment & Plan: Lexapro.  Appears to be doing well.  Follow.    Family history of colon cancer Assessment & Plan: Colonoscopy 04/20/20 - tubular adenomas (4). Hyperplastic polyp.  Recommended f/u colonoscopy in 3 years.  [facility]).  Sees Dr Carole Binning.     Hyperglycemia Assessment & Plan: Low carb diet and exercise.  Follow met b and A1c.   Orders: -     Hemoglobin A1c; Future     [place], MD

## 2022-08-19 ENCOUNTER — Encounter: Payer: Self-pay | Admitting: Internal Medicine

## 2022-08-19 DIAGNOSIS — R739 Hyperglycemia, unspecified: Secondary | ICD-10-CM | POA: Insufficient documentation

## 2022-08-19 NOTE — Assessment & Plan Note (Signed)
On pravastatin.  Low cholesterol diet and exercise.  Follow lipid panel and liver function tests.   

## 2022-08-19 NOTE — Assessment & Plan Note (Signed)
On thyroid medication.  Follow tsh.  

## 2022-08-19 NOTE — Assessment & Plan Note (Addendum)
Colonoscopy 04/20/20 - tubular adenomas (4). Hyperplastic polyp.  Recommended f/u colonoscopy in 3 years.  [facility]).  Sees Dr Carole Binning.

## 2022-08-19 NOTE — Assessment & Plan Note (Signed)
Low carb diet and exercise.  Follow liver function tests.   

## 2022-08-19 NOTE — Assessment & Plan Note (Signed)
Continue cpap.  

## 2022-08-19 NOTE — Assessment & Plan Note (Signed)
Follow cbc.  

## 2022-08-19 NOTE — Assessment & Plan Note (Signed)
Continue singulair and xyzal.  Stable.  °

## 2022-08-19 NOTE — Assessment & Plan Note (Signed)
States she is up to date.  Received records from [facility].  Did not receive mammogram report.  Called and requested.

## 2022-08-19 NOTE — Assessment & Plan Note (Signed)
Continue symbicort, singulair and albuterol.   Doing well on current regimen.  Breathing stable. Follow.   

## 2022-08-19 NOTE — Assessment & Plan Note (Signed)
Lexapro.  Appears to be doing well.  Follow.

## 2022-08-19 NOTE — Assessment & Plan Note (Signed)
Low-carb diet and exercise.  Follow met b and A1c. 

## 2022-08-20 ENCOUNTER — Encounter: Payer: Self-pay | Admitting: Internal Medicine

## 2022-09-02 ENCOUNTER — Other Ambulatory Visit: Payer: Self-pay | Admitting: Internal Medicine

## 2022-10-05 ENCOUNTER — Other Ambulatory Visit: Payer: Self-pay | Admitting: Internal Medicine

## 2022-10-22 ENCOUNTER — Other Ambulatory Visit: Payer: Self-pay | Admitting: Pulmonary Disease

## 2022-10-25 ENCOUNTER — Other Ambulatory Visit: Payer: Self-pay | Admitting: Internal Medicine

## 2022-10-25 ENCOUNTER — Encounter (INDEPENDENT_AMBULATORY_CARE_PROVIDER_SITE_OTHER): Payer: Self-pay

## 2022-11-13 ENCOUNTER — Ambulatory Visit: Payer: Commercial Managed Care - PPO | Admitting: Internal Medicine

## 2022-12-08 ENCOUNTER — Other Ambulatory Visit: Payer: Self-pay | Admitting: Internal Medicine

## 2022-12-12 ENCOUNTER — Encounter: Payer: Self-pay | Admitting: Internal Medicine

## 2022-12-12 MED ORDER — LEVOTHYROXINE SODIUM 112 MCG PO TABS: 112.0000 ug | ORAL_TABLET | Freq: Every day | ORAL | 0 refills | Status: DC

## 2022-12-25 ENCOUNTER — Encounter: Payer: Self-pay | Admitting: Internal Medicine

## 2022-12-25 ENCOUNTER — Other Ambulatory Visit (INDEPENDENT_AMBULATORY_CARE_PROVIDER_SITE_OTHER): Payer: Commercial Managed Care - PPO

## 2022-12-25 DIAGNOSIS — M25562 Pain in left knee: Secondary | ICD-10-CM | POA: Insufficient documentation

## 2022-12-25 DIAGNOSIS — E039 Hypothyroidism, unspecified: Secondary | ICD-10-CM

## 2022-12-25 DIAGNOSIS — E7800 Pure hypercholesterolemia, unspecified: Secondary | ICD-10-CM | POA: Diagnosis not present

## 2022-12-25 DIAGNOSIS — D649 Anemia, unspecified: Secondary | ICD-10-CM | POA: Diagnosis not present

## 2022-12-25 DIAGNOSIS — R739 Hyperglycemia, unspecified: Secondary | ICD-10-CM | POA: Diagnosis not present

## 2022-12-25 LAB — LIPID PANEL
Cholesterol: 177 mg/dL (ref 0–200)
HDL: 51.1 mg/dL (ref >39.00)
LDL Cholesterol: 99 mg/dL (ref 0–99)
NonHDL: 126.01
Total CHOL/HDL Ratio: 3
Triglycerides: 137 mg/dL (ref 0.0–149.0)
VLDL: 27.4 mg/dL (ref 0.0–40.0)

## 2022-12-25 LAB — CBC WITH DIFFERENTIAL/PLATELET
Basophils Absolute: 0.1 10*3/uL (ref 0.0–0.1)
Basophils Relative: 1.1 % (ref 0.0–3.0)
Eosinophils Absolute: 0.3 10*3/uL (ref 0.0–0.7)
Eosinophils Relative: 3.4 % (ref 0.0–5.0)
HCT: 37.9 % (ref 36.0–46.0)
Hemoglobin: 12.2 g/dL (ref 12.0–15.0)
Lymphocytes Relative: 25.6 % (ref 12.0–46.0)
Lymphs Abs: 2 10*3/uL (ref 0.7–4.0)
MCHC: 32.1 g/dL (ref 30.0–36.0)
MCV: 84.7 fL (ref 78.0–100.0)
Monocytes Absolute: 0.7 10*3/uL (ref 0.1–1.0)
Monocytes Relative: 8.5 % (ref 3.0–12.0)
Neutro Abs: 4.8 10*3/uL (ref 1.4–7.7)
Neutrophils Relative %: 61.4 % (ref 43.0–77.0)
Platelets: 271 10*3/uL (ref 150.0–400.0)
RBC: 4.48 Mil/uL (ref 3.87–5.11)
RDW: 15.7 % — ABNORMAL HIGH (ref 11.5–15.5)
WBC: 7.8 10*3/uL (ref 4.0–10.5)

## 2022-12-25 LAB — HEPATIC FUNCTION PANEL
ALT: 17 U/L (ref 0–35)
AST: 14 U/L (ref 0–37)
Albumin: 4.1 g/dL (ref 3.5–5.2)
Alkaline Phosphatase: 75 U/L (ref 39–117)
Bilirubin, Direct: 0 mg/dL (ref 0.0–0.3)
Total Bilirubin: 0.3 mg/dL (ref 0.2–1.2)
Total Protein: 7.2 g/dL (ref 6.0–8.3)

## 2022-12-25 LAB — BASIC METABOLIC PANEL
BUN: 11 mg/dL (ref 6–23)
CO2: 28 meq/L (ref 19–32)
Calcium: 9.3 mg/dL (ref 8.4–10.5)
Chloride: 103 meq/L (ref 96–112)
Creatinine, Ser: 0.77 mg/dL (ref 0.40–1.20)
GFR: 84.46 mL/min (ref >60.00)
Glucose, Bld: 107 mg/dL — ABNORMAL HIGH (ref 70–99)
Potassium: 4.1 meq/L (ref 3.5–5.1)
Sodium: 140 meq/L (ref 135–145)

## 2022-12-25 LAB — HEMOGLOBIN A1C: Hgb A1c MFr Bld: 6.3 % (ref 4.6–6.5)

## 2022-12-25 LAB — TSH: TSH: 0.25 u[IU]/mL — ABNORMAL LOW (ref 0.35–5.50)

## 2022-12-26 ENCOUNTER — Other Ambulatory Visit: Payer: Self-pay

## 2022-12-26 MED ORDER — LEVOTHYROXINE SODIUM 100 MCG PO TABS: 100.0000 ug | ORAL_TABLET | Freq: Every day | ORAL | 1 refills | Status: DC

## 2022-12-28 ENCOUNTER — Ambulatory Visit: Payer: Commercial Managed Care - PPO | Admitting: Internal Medicine

## 2022-12-28 ENCOUNTER — Encounter: Payer: Self-pay | Admitting: Internal Medicine

## 2022-12-28 VITALS — BP 126/74 | HR 62 | Temp 98.0°F | Resp 16 | Ht 62.0 in | Wt 217.6 lb

## 2022-12-28 DIAGNOSIS — M25562 Pain in left knee: Secondary | ICD-10-CM

## 2022-12-28 DIAGNOSIS — E039 Hypothyroidism, unspecified: Secondary | ICD-10-CM | POA: Diagnosis not present

## 2022-12-28 DIAGNOSIS — G4733 Obstructive sleep apnea (adult) (pediatric): Secondary | ICD-10-CM

## 2022-12-28 DIAGNOSIS — F439 Reaction to severe stress, unspecified: Secondary | ICD-10-CM

## 2022-12-28 DIAGNOSIS — K760 Fatty (change of) liver, not elsewhere classified: Secondary | ICD-10-CM

## 2022-12-28 DIAGNOSIS — D649 Anemia, unspecified: Secondary | ICD-10-CM

## 2022-12-28 DIAGNOSIS — E7800 Pure hypercholesterolemia, unspecified: Secondary | ICD-10-CM

## 2022-12-28 DIAGNOSIS — R739 Hyperglycemia, unspecified: Secondary | ICD-10-CM

## 2022-12-28 DIAGNOSIS — J4520 Mild intermittent asthma, uncomplicated: Secondary | ICD-10-CM

## 2022-12-28 DIAGNOSIS — Z800 Family history of malignant neoplasm of digestive organs: Secondary | ICD-10-CM

## 2022-12-28 DIAGNOSIS — K219 Gastro-esophageal reflux disease without esophagitis: Secondary | ICD-10-CM

## 2022-12-28 NOTE — Assessment & Plan Note (Signed)
Low carb diet and exercise.  Follow liver function tests.   

## 2022-12-28 NOTE — Progress Notes (Signed)
Subjective:    Patient ID: Bethany Cook, female    DOB: [birth year], 57 y.o.   MRN: [id number]  Patient here for  Chief Complaint  Patient presents with   Medical Management of Chronic Issues    HPI Here for a scheduled follow up. Saw cardiology 02/06/22 - recommended zio monitor - nonsustained SVT. No afib. Continues symbicort, singulair and albuterol. Reviewed recent labs.  TSH suppressed.  Synthroid dose reduced. She is doing.  Has been having left knee pain.  Saw [facility].  S/p injection. The evening of the appt, was getting in the car and hear something pop.  Reevaluated the next day - ortho.  Placed on meloxicam.  Knee is doing better.  No chest pain or sob reported.  No increased cough or congestion.  No abdominal pain or bowel change.  Handling stress.  Overall doing well.    Past Medical History:  Diagnosis Date   Anemia    iron deficient   Asthma    Change in bowel movement 12/21/2016   Cholelithiasis 10/27/2016   Dizziness 12/26/2014   Environmental allergies    Family history of colon cancer 04/25/2013   Colonoscopy 11/18/14 - diverticulosis (sigmoid - mild).  No colorectal neoplasia.  Recommend f/u colonoscopy in five years.     Fatty liver 10/27/2016   GERD (gastroesophageal reflux disease) 04/25/2013   Health care maintenance 04/23/2014   Hypercholesterolemia    Hyperthyroidism    s/p ablation   Hypothyroidism 01/27/2012   Lung nodule 10/27/2016   Viral upper respiratory illness 05/30/2014   Past Surgical History:  Procedure Laterality Date   NO PAST SURGERIES     Family History  Problem Relation Age of Onset   Colon cancer Father    Colon cancer Paternal Uncle        x4   Asthma Other        cousine   Breast cancer Neg Hx    Social History   Socioeconomic History   Marital status: Single    Spouse name: Not on file   Number of children: 0   Years of education: Not on file   Highest education level: Not on file  Occupational History   Not on file   Tobacco Use   Smoking status: Never   Smokeless tobacco: Never  Vaping Use   Vaping status: Never Used  Substance and Sexual Activity   Alcohol use: Yes    Alcohol/week: 0.0 standard drinks of alcohol    Comment: occasional   Drug use: No   Sexual activity: Not on file  Other Topics Concern   Not on file  Social History Narrative   Not on file   Social Determinants of Health   Financial Resource Strain: Not on file  Food Insecurity: Not on file  Transportation Needs: Not on file  Physical Activity: Not on file  Stress: Not on file  Social Connections: Not on file     Review of Systems  Constitutional:  Negative for appetite change and unexpected weight change.  HENT:  Negative for congestion and sinus pressure.   Respiratory:  Negative for cough, chest tightness and shortness of breath.   Cardiovascular:  Negative for chest pain, palpitations and leg swelling.  Gastrointestinal:  Negative for abdominal pain, diarrhea, nausea and vomiting.  Genitourinary:  Negative for difficulty urinating and dysuria.  Musculoskeletal:  Negative for joint swelling and myalgias.  Skin:  Negative for color change and rash.  Neurological:  Negative for dizziness and  headaches.  Psychiatric/Behavioral:  Negative for agitation and dysphoric mood.        Objective:     BP 126/74   Pulse 62   Temp 98 F (36.7 C)   Resp 16   Ht 5\' 2"  (1.575 m)   Wt 217 lb 9.6 oz (98.7 kg)   SpO2 98%   BMI 39.80 kg/m  Wt Readings from Last 3 Encounters:  12/28/22 217 lb 9.6 oz (98.7 kg)  08/13/22 220 lb (99.8 kg)  04/11/22 224 lb 12.8 oz (102 kg)    Physical Exam Vitals reviewed.  Constitutional:      General: She is not in acute distress.    Appearance: Normal appearance.  HENT:     Head: Normocephalic and atraumatic.     Right Ear: External ear normal.     Left Ear: External ear normal.  Eyes:     General: No scleral icterus.       Right eye: No discharge.        Left eye: No  discharge.     Conjunctiva/sclera: Conjunctivae normal.  Neck:     Thyroid: No thyromegaly.  Cardiovascular:     Rate and Rhythm: Normal rate and regular rhythm.  Pulmonary:     Effort: No respiratory distress.     Breath sounds: Normal breath sounds. No wheezing.  Abdominal:     General: Bowel sounds are normal.     Palpations: Abdomen is soft.     Tenderness: There is no abdominal tenderness.  Musculoskeletal:        General: No swelling or tenderness.     Cervical back: Neck supple. No tenderness.  Lymphadenopathy:     Cervical: No cervical adenopathy.  Skin:    Findings: No erythema or rash.  Neurological:     Mental Status: She is alert.  Psychiatric:        Mood and Affect: Mood normal.        Behavior: Behavior normal.      Outpatient Encounter Medications as of 12/28/2022  Medication Sig   albuterol (PROVENTIL) (2.5 MG/3ML) 0.083% nebulizer solution Take 3 mLs (2.5 mg total) by nebulization every 6 (six) hours as needed for wheezing or shortness of breath.   ALPRAZolam (XANAX) 0.25 MG tablet Take 0.25 mg by mouth every 8 (eight) hours as needed.   Azelastine HCl 137 MCG/SPRAY SOLN PLACE 1 SPRAY INTO BOTH NOSTRILS 2 (TWO) TIMES DAILY. USE IN EACH NOSTRIL AS DIRECTED   budesonide-formoterol (SYMBICORT) 160-4.5 MCG/ACT inhaler Inhale 2 puffs into the lungs 2 (two) times daily.   EPINEPHrine 0.3 mg/0.3 mL IJ SOAJ injection Inject into the muscle as directed.   escitalopram (LEXAPRO) 20 MG tablet TAKE 1 TABLET BY MOUTH EVERY DAY   levocetirizine (XYZAL) 5 MG tablet Take 5 mg by mouth every evening.   levothyroxine (SYNTHROID) 100 MCG tablet Take 1 tablet (100 mcg total) by mouth daily.   metFORMIN (GLUCOPHAGE-XR) 500 MG 24 hr tablet TAKE 1 TABLET BY MOUTH EVERY DAY WITH BREAKFAST   montelukast (SINGULAIR) 10 MG tablet TAKE 1 TABLET BY MOUTH AT  BEDTIME   omeprazole (PRILOSEC) 40 MG capsule TAKE 1 CAPSULE BY MOUTH DAILY   pravastatin (PRAVACHOL) 20 MG tablet TAKE 1 TABLET  BY MOUTH EVERY DAY   zolpidem (AMBIEN CR) 6.25 MG CR tablet TAKE 1 TABLET BY MOUTH AT BEDTIME AS NEEDED FOR SLEEP   No facility-administered encounter medications on file as of 12/28/2022.     Lab Results  Component Value  Date   WBC 7.8 12/25/2022   HGB 12.2 12/25/2022   HCT 37.9 12/25/2022   PLT 271.0 12/25/2022   GLUCOSE 107 (H) 12/25/2022   CHOL 177 12/25/2022   TRIG 137.0 12/25/2022   HDL 51.10 12/25/2022   LDLDIRECT 157.7 04/21/2013   LDLCALC 99 12/25/2022   ALT 17 12/25/2022   AST 14 12/25/2022   NA 140 12/25/2022   K 4.1 12/25/2022   CL 103 12/25/2022   CREATININE 0.77 12/25/2022   BUN 11 12/25/2022   CO2 28 12/25/2022   TSH 0.25 (L) 12/25/2022   HGBA1C 6.3 12/25/2022    US Abdomen Complete  Result Date: 12/17/2019 CLINICAL DATA:  Gallstones, bilateral flank pain. EXAM: ABDOMEN ULTRASOUND COMPLETE COMPARISON:  Abdominal ultrasound 09/24/2016 FINDINGS: Gallbladder: There is a shadowing gallstone measuring 1.2 cm. No gallbladder wall thickening. No sonographic Murphy sign noted by sonographer. Common bile duct: Diameter: 0.4 cm, within normal limits. Liver: No focal lesion identified. Diffusely increased liver parenchymal echogenicity. Portal vein is patent on color Doppler imaging with normal direction of blood flow towards the liver. IVC: No abnormality visualized. Pancreas: Visualized portion unremarkable. Spleen: Size and appearance within normal limits.  Small splenule. Right Kidney: Length: 11.3 cm. Echogenicity within normal limits. No mass or hydronephrosis visualized. Left Kidney: Length: 11.0 cm. Echogenicity within normal limits. No mass or hydronephrosis visualized. Abdominal aorta: No aneurysm visualized. Other findings: None. IMPRESSION: 1.  Cholelithiasis without evidence of cholecystitis. 2. Diffusely increased liver parenchymal echogenicity most commonly seen with hepatic steatosis. Electronically Signed   By: Emmaline Kluver M.D.   On: 12/17/2019 11:17        Assessment & Plan:  Hypothyroidism, unspecified type Assessment & Plan: On thyroid medication.  Just adjusted dose. Follow tsh.     Hyperglycemia Assessment & Plan: Low carb diet and exercise.  Follow met b and A1c. A1c 6.4.    Hypercholesterolemia Assessment & Plan: On pravastatin.  Low cholesterol diet and exercise.  Follow lipid panel and liver function tests.     Stress Assessment & Plan: Lexapro.  Appears to be doing well.  Follow.    OSA (obstructive sleep apnea) Assessment & Plan: Continue cpap.    Left knee pain, unspecified chronicity Assessment & Plan: 12/25/2022 - [facility] - s/p injection. Reevaluated 12/26/22 - increased pain.  Meloxicam. Knee is doing better.    Gastroesophageal reflux disease, unspecified whether esophagitis present Assessment & Plan: No acid reflux reported.  Prilosec.    Fatty liver Assessment & Plan: Low carb diet and exercise.  Follow liver function tests.     Family history of colon cancer Assessment & Plan: Colonoscopy 04/20/20 - tubular adenomas (4). Hyperplastic polyp.  Recommended f/u colonoscopy in 3 years.  [facility]).  Sees Dr Carole Binning.     Mild intermittent asthma without complication Assessment & Plan: Continue symbicort, singulair and albuterol.   Doing well on current regimen.  Breathing stable. Follow.     Anemia, unspecified type Assessment & Plan: Follow cbc.       Dale [place], MD

## 2022-12-28 NOTE — Assessment & Plan Note (Signed)
No acid reflux reported.  Prilosec.  

## 2022-12-28 NOTE — Assessment & Plan Note (Signed)
Follow cbc.  

## 2022-12-28 NOTE — Assessment & Plan Note (Signed)
Continue cpap.  

## 2022-12-28 NOTE — Addendum Note (Signed)
Addended by: Rita Ohara D on: 12/28/2022 09:51 AM   Modules accepted: Orders

## 2022-12-28 NOTE — Assessment & Plan Note (Signed)
Colonoscopy 04/20/20 - tubular adenomas (4). Hyperplastic polyp.  Recommended f/u colonoscopy in 3 years.  [facility]).  Sees Dr Carole Binning.

## 2022-12-28 NOTE — Assessment & Plan Note (Signed)
On thyroid medication.  Just adjusted dose. Follow tsh.

## 2022-12-28 NOTE — Assessment & Plan Note (Signed)
Continue symbicort, singulair and albuterol.   Doing well on current regimen.  Breathing stable. Follow.   

## 2022-12-28 NOTE — Assessment & Plan Note (Signed)
Low carb diet and exercise.  Follow met b and A1c. A1c 6.4.

## 2022-12-28 NOTE — Assessment & Plan Note (Signed)
Lexapro.  Appears to be doing well.  Follow.

## 2022-12-28 NOTE — Assessment & Plan Note (Signed)
12/25/2022 - [facility] - s/p injection. Reevaluated 12/26/22 - increased pain.  Meloxicam. Knee is doing better.

## 2022-12-28 NOTE — Assessment & Plan Note (Signed)
On pravastatin.  Low cholesterol diet and exercise.  Follow lipid panel and liver function tests.   

## 2023-01-16 ENCOUNTER — Encounter: Payer: Self-pay | Admitting: Internal Medicine

## 2023-01-16 NOTE — Telephone Encounter (Signed)
Called patient to let her know that work up was done by ortho so order will have to come from them in order for insurance to cover. Pt gave verbal understanding.

## 2023-01-22 ENCOUNTER — Other Ambulatory Visit: Payer: Self-pay | Admitting: Internal Medicine

## 2023-03-08 ENCOUNTER — Other Ambulatory Visit: Payer: Self-pay | Admitting: Internal Medicine

## 2023-04-09 ENCOUNTER — Other Ambulatory Visit (INDEPENDENT_AMBULATORY_CARE_PROVIDER_SITE_OTHER): Payer: Commercial Managed Care - PPO

## 2023-04-09 DIAGNOSIS — E039 Hypothyroidism, unspecified: Secondary | ICD-10-CM

## 2023-04-09 LAB — TSH: TSH: 2.05 u[IU]/mL (ref 0.35–5.50)

## 2023-04-10 ENCOUNTER — Encounter: Payer: Self-pay | Admitting: Internal Medicine

## 2023-05-01 ENCOUNTER — Encounter: Payer: Self-pay | Admitting: Internal Medicine

## 2023-05-01 ENCOUNTER — Telehealth: Payer: Self-pay | Admitting: Internal Medicine

## 2023-05-01 ENCOUNTER — Telehealth (INDEPENDENT_AMBULATORY_CARE_PROVIDER_SITE_OTHER): Payer: Commercial Managed Care - PPO | Admitting: Internal Medicine

## 2023-05-01 ENCOUNTER — Encounter: Payer: Commercial Managed Care - PPO | Admitting: Internal Medicine

## 2023-05-01 VITALS — BP 126/74 | Ht 62.0 in | Wt 217.0 lb

## 2023-05-01 DIAGNOSIS — E039 Hypothyroidism, unspecified: Secondary | ICD-10-CM | POA: Diagnosis not present

## 2023-05-01 DIAGNOSIS — K8020 Calculus of gallbladder without cholecystitis without obstruction: Secondary | ICD-10-CM | POA: Diagnosis not present

## 2023-05-01 DIAGNOSIS — R739 Hyperglycemia, unspecified: Secondary | ICD-10-CM

## 2023-05-01 DIAGNOSIS — Z800 Family history of malignant neoplasm of digestive organs: Secondary | ICD-10-CM | POA: Diagnosis not present

## 2023-05-01 DIAGNOSIS — E7800 Pure hypercholesterolemia, unspecified: Secondary | ICD-10-CM

## 2023-05-01 DIAGNOSIS — G4733 Obstructive sleep apnea (adult) (pediatric): Secondary | ICD-10-CM

## 2023-05-01 DIAGNOSIS — Z136 Encounter for screening for cardiovascular disorders: Secondary | ICD-10-CM | POA: Insufficient documentation

## 2023-05-01 DIAGNOSIS — J4520 Mild intermittent asthma, uncomplicated: Secondary | ICD-10-CM | POA: Diagnosis not present

## 2023-05-01 DIAGNOSIS — K219 Gastro-esophageal reflux disease without esophagitis: Secondary | ICD-10-CM

## 2023-05-01 DIAGNOSIS — Z1211 Encounter for screening for malignant neoplasm of colon: Secondary | ICD-10-CM

## 2023-05-01 NOTE — Assessment & Plan Note (Signed)
Breathing doing well. Continue symbicort, singulair and albuterol prn.

## 2023-05-01 NOTE — Progress Notes (Signed)
Patient ID: Bethany Cook, female   DOB: 10/09/1965, 58 y.o.   MRN: [id number]   Virtual Visit via video Note  I connected with Myna Bright by a video enabled telemedicine application and verified that I am speaking with the correct person using two identifiers. Location patient: home Location provider: home Persons participating in the virtual visit: patient, provider  The limitations, risks, security and privacy concerns of performing an evaluation and management service by video and the availability of in person appointments have been discussed. It has also been discussed with the patient that there may be a patient responsible charge related to this service. The patient expressed understanding and agreed to proceed.   Reason for visit: follow up appt  HPI: Here for a scheduled follow up. Previously saw cardiology 01/2022 - zio monitor - nonsustained SVT. No afib. Breathing stable - maintained on symbicort, singulair and albuterol. No increased cough or congestion. Reports some persistent intermittent right upper quadrant discomfort. Does notice with certain position changes. Reports maternal aunt s/p CABG - cholesterol of 400. Concerned regarding family history of elevated cholesterol/CAD. Discussed calcium score. Is using cpap. Concerned mask not fitting well. Plans to discuss/f/u with pulmonary. Discussed due colonoscopy. Agreeable for referral.  Having some increased gas and bloating. Discussed monitoring for possible triggers. Seeing [facility] - f/u knee issues - posterior medial meniscus tear. Discussed knee replacement. Taking celebrex as needed.    ROS: See pertinent positives and negatives per HPI.  Past Medical History:  Diagnosis Date   Anemia    iron deficient   Asthma    Change in bowel movement 12/21/2016   Cholelithiasis 10/27/2016   Dizziness 12/26/2014   Environmental allergies    Family history of colon cancer 04/25/2013   Colonoscopy 11/18/14 - diverticulosis  (sigmoid - mild).  No colorectal neoplasia.  Recommend f/u colonoscopy in five years.     Fatty liver 10/27/2016   GERD (gastroesophageal reflux disease) 04/25/2013   Health care maintenance 04/23/2014   Hypercholesterolemia    Hyperthyroidism    s/p ablation   Hypothyroidism 01/27/2012   Lung nodule 10/27/2016   Viral upper respiratory illness 05/30/2014    Past Surgical History:  Procedure Laterality Date   NO PAST SURGERIES      Family History  Problem Relation Age of Onset   Colon cancer Father    Colon cancer Paternal Uncle        x4   Asthma Other        cousine   Breast cancer Neg Hx     SOCIAL HX: reviewed.    Current Outpatient Medications:    albuterol (PROVENTIL) (2.5 MG/3ML) 0.083% nebulizer solution, Take 3 mLs (2.5 mg total) by nebulization every 6 (six) hours as needed for wheezing or shortness of breath., Disp: 150 mL, Rfl: 1   ALPRAZolam (XANAX) 0.25 MG tablet, Take 0.25 mg by mouth every 8 (eight) hours as needed., Disp: , Rfl:    Azelastine HCl 137 MCG/SPRAY SOLN, PLACE 1 SPRAY INTO BOTH NOSTRILS 2 (TWO) TIMES DAILY. USE IN EACH NOSTRIL AS DIRECTED, Disp: 30 mL, Rfl: 1   budesonide-formoterol (SYMBICORT) 160-4.5 MCG/ACT inhaler, Inhale 2 puffs into the lungs 2 (two) times daily., Disp: , Rfl:    CELEBREX 200 MG capsule, , Disp: , Rfl:    EPINEPHrine 0.3 mg/0.3 mL IJ SOAJ injection, Inject into the muscle as directed., Disp: , Rfl:    escitalopram (LEXAPRO) 20 MG tablet, TAKE 1 TABLET BY MOUTH EVERY DAY, Disp: 90  tablet, Rfl: 3   levocetirizine (XYZAL) 5 MG tablet, Take 5 mg by mouth every evening., Disp: , Rfl:    levothyroxine (SYNTHROID) 100 MCG tablet, TAKE 1 TABLET BY MOUTH EVERY DAY, Disp: 90 tablet, Rfl: 1   metFORMIN (GLUCOPHAGE-XR) 500 MG 24 hr tablet, TAKE 1 TABLET BY MOUTH EVERY DAY WITH BREAKFAST, Disp: 90 tablet, Rfl: 1   montelukast (SINGULAIR) 10 MG tablet, TAKE 1 TABLET BY MOUTH AT  BEDTIME, Disp: 90 tablet, Rfl: 3   omeprazole (PRILOSEC) 40 MG  capsule, TAKE 1 CAPSULE BY MOUTH DAILY, Disp: 90 capsule, Rfl: 3   pravastatin (PRAVACHOL) 20 MG tablet, TAKE 1 TABLET BY MOUTH EVERY DAY, Disp: 90 tablet, Rfl: 1   zolpidem (AMBIEN CR) 6.25 MG CR tablet, TAKE 1 TABLET BY MOUTH AT BEDTIME AS NEEDED FOR SLEEP, Disp: 30 tablet, Rfl: 3  EXAM:  GENERAL: alert, oriented, appears well and in no acute distress  HEENT: atraumatic, conjunttiva clear, no obvious abnormalities on inspection of external nose and ears  NECK: normal movements of the head and neck  LUNGS: on inspection no signs of respiratory distress, breathing rate appears normal, no obvious gross SOB, gasping or wheezing  CV: no obvious cyanosis  PSYCH/NEURO: pleasant and cooperative, no obvious depression or anxiety, speech and thought processing grossly intact  ASSESSMENT AND PLAN:  Discussed the following assessment and plan:  Problem List Items Addressed This Visit     Asthma   Breathing doing well. Continue symbicort, singulair and albuterol prn.       Cholelithiasis   Has a documented history of cholelithiasis. Persistent intermittent right upper quadrant pain. Question if msk etiology. Given persistence, will check abdominal ultrasound. Also check labs, including liver panel.       Relevant Orders   US Abdomen Complete   Encounter for screening for coronary artery disease   Discussed family history of CAD and elevated cholesterol. Check CT calcium score.       Relevant Orders   CT CARDIAC SCORING   Family history of colon cancer   Colonoscopy 04/20/20 - tubular adenomas (4). Hyperplastic polyp.  Recommended f/u colonoscopy in 3 years.  [facility]).  Sees Dr Carole Binning.  Discussed due f/u.  Ok for referral.       GERD (gastroesophageal reflux disease)   No acid reflux reported.       Hypercholesterolemia   Continue pravastatin. Check lipid panel and liver function tests.  Discussed diet and exercise.       Hyperglycemia   Low carb diet and  exercise. Check met b and A1c with next fasting labs.       Hypothyroidism - Primary   On thyroid medication. Check tsh with next labs.       OSA (obstructive sleep apnea)   Wearing cpap.  Having issues with her mask as outlined. She plans to f/u with pulmonary to discuss.       Other Visit Diagnoses       Colon cancer screening       Relevant Orders   Ambulatory referral to Gastroenterology       Return in about 2 months (around 06/29/2023) for physical.   I discussed the assessment and treatment plan with the patient. The patient was provided an opportunity to ask questions and all were answered. The patient agreed with the plan and demonstrated an understanding of the instructions.   The patient was advised to call back or seek an in-person evaluation if the symptoms worsen or if  the condition fails to improve as anticipated.    Dale Chandler, MD

## 2023-05-01 NOTE — Assessment & Plan Note (Signed)
Discussed family history of CAD and elevated cholesterol. Check CT calcium score.

## 2023-05-01 NOTE — Assessment & Plan Note (Signed)
No acid reflux reported.

## 2023-05-01 NOTE — Assessment & Plan Note (Addendum)
Low carb diet and exercise. Check met b and A1c with next fasting labs.

## 2023-05-01 NOTE — Assessment & Plan Note (Signed)
Colonoscopy 04/20/20 - tubular adenomas (4). Hyperplastic polyp.  Recommended f/u colonoscopy in 3 years.  [facility]).  Sees Dr Carole Binning.  Discussed due f/u.  Ok for referral.

## 2023-05-01 NOTE — Assessment & Plan Note (Signed)
Wearing cpap.  Having issues with her mask as outlined. She plans to f/u with pulmonary to discuss.

## 2023-05-01 NOTE — Assessment & Plan Note (Signed)
Continue pravastatin. Check lipid panel and liver function tests.  Discussed diet and exercise.

## 2023-05-01 NOTE — Assessment & Plan Note (Signed)
Has a documented history of cholelithiasis. Persistent intermittent right upper quadrant pain. Question if msk etiology. Given persistence, will check abdominal ultrasound. Also check labs, including liver panel.

## 2023-05-01 NOTE — Telephone Encounter (Signed)
Please schedule physical in 2 months and fasting labs in 3-4 weeks.

## 2023-05-01 NOTE — Assessment & Plan Note (Signed)
On thyroid medication. Check tsh with next labs.

## 2023-05-02 ENCOUNTER — Telehealth: Payer: Self-pay | Admitting: Internal Medicine

## 2023-05-02 NOTE — Telephone Encounter (Signed)
Lft pt vm to call ofc to sch [place] and . thanks

## 2023-05-10 ENCOUNTER — Ambulatory Visit
Admission: RE | Admit: 2023-05-10 | Discharge: 2023-05-10 | Disposition: A | Discharge status: Home / Self Care | Payer: Self-pay | Source: Ambulatory Visit | Attending: Internal Medicine | Admitting: Internal Medicine

## 2023-05-10 ENCOUNTER — Ambulatory Visit
Admission: RE | Admit: 2023-05-10 | Discharge: 2023-05-10 | Disposition: A | Discharge status: Home / Self Care | Payer: Commercial Managed Care - PPO | Source: Ambulatory Visit | Attending: Internal Medicine | Admitting: Internal Medicine

## 2023-05-10 DIAGNOSIS — K8020 Calculus of gallbladder without cholecystitis without obstruction: Secondary | ICD-10-CM | POA: Diagnosis present

## 2023-05-10 DIAGNOSIS — Z136 Encounter for screening for cardiovascular disorders: Secondary | ICD-10-CM | POA: Insufficient documentation

## 2023-05-15 ENCOUNTER — Other Ambulatory Visit: Payer: Self-pay

## 2023-05-15 MED ORDER — PRAVASTATIN SODIUM 40 MG PO TABS: 40.0000 mg | ORAL_TABLET | Freq: Every day | ORAL | 1 refills | Status: DC

## 2023-05-21 ENCOUNTER — Telehealth: Payer: Self-pay

## 2023-05-21 NOTE — Telephone Encounter (Signed)
 Copied from CRM [id number]. Topic: General - Other >> May 21, 2023 10:03 AM [place] A wrote: Reason for CRM: Patient returned call for Trisha

## 2023-05-21 NOTE — Telephone Encounter (Signed)
 LMTCB. See result note and relay message when returns call.

## 2023-06-03 ENCOUNTER — Ambulatory Visit: Payer: Commercial Managed Care - PPO | Admitting: Pulmonary Disease

## 2023-06-13 LAB — HM COLONOSCOPY

## 2023-06-18 ENCOUNTER — Encounter: Payer: Self-pay | Admitting: Internal Medicine

## 2023-06-18 ENCOUNTER — Ambulatory Visit (INDEPENDENT_AMBULATORY_CARE_PROVIDER_SITE_OTHER): Payer: Commercial Managed Care - PPO | Admitting: Internal Medicine

## 2023-06-18 ENCOUNTER — Other Ambulatory Visit

## 2023-06-18 VITALS — BP 126/70 | HR 71 | Temp 98.0°F | Resp 16 | Ht 62.0 in | Wt 226.0 lb

## 2023-06-18 DIAGNOSIS — E039 Hypothyroidism, unspecified: Secondary | ICD-10-CM | POA: Diagnosis not present

## 2023-06-18 DIAGNOSIS — E7800 Pure hypercholesterolemia, unspecified: Secondary | ICD-10-CM | POA: Diagnosis not present

## 2023-06-18 DIAGNOSIS — R739 Hyperglycemia, unspecified: Secondary | ICD-10-CM | POA: Diagnosis not present

## 2023-06-18 DIAGNOSIS — Z0000 Encounter for general adult medical examination without abnormal findings: Secondary | ICD-10-CM

## 2023-06-18 DIAGNOSIS — K8020 Calculus of gallbladder without cholecystitis without obstruction: Secondary | ICD-10-CM

## 2023-06-18 DIAGNOSIS — F439 Reaction to severe stress, unspecified: Secondary | ICD-10-CM

## 2023-06-18 DIAGNOSIS — K760 Fatty (change of) liver, not elsewhere classified: Secondary | ICD-10-CM

## 2023-06-18 DIAGNOSIS — Z136 Encounter for screening for cardiovascular disorders: Secondary | ICD-10-CM

## 2023-06-18 DIAGNOSIS — Z800 Family history of malignant neoplasm of digestive organs: Secondary | ICD-10-CM

## 2023-06-18 DIAGNOSIS — J4520 Mild intermittent asthma, uncomplicated: Secondary | ICD-10-CM | POA: Diagnosis not present

## 2023-06-18 DIAGNOSIS — G4733 Obstructive sleep apnea (adult) (pediatric): Secondary | ICD-10-CM

## 2023-06-18 LAB — BASIC METABOLIC PANEL WITH GFR
BUN: 9 mg/dL (ref 6–23)
CO2: 27 meq/L (ref 19–32)
Calcium: 9.2 mg/dL (ref 8.4–10.5)
Chloride: 104 meq/L (ref 96–112)
Creatinine, Ser: 0.82 mg/dL (ref 0.40–1.20)
GFR: 78.05 mL/min (ref >60.00)
Glucose, Bld: 115 mg/dL — ABNORMAL HIGH (ref 70–99)
Potassium: 4.3 meq/L (ref 3.5–5.1)
Sodium: 142 meq/L (ref 135–145)

## 2023-06-18 LAB — LIPID PANEL
Cholesterol: 166 mg/dL (ref 0–200)
HDL: 46 mg/dL (ref >39.00)
LDL Cholesterol: 93 mg/dL (ref 0–99)
NonHDL: 119.84
Total CHOL/HDL Ratio: 4
Triglycerides: 134 mg/dL (ref 0.0–149.0)
VLDL: 26.8 mg/dL (ref 0.0–40.0)

## 2023-06-18 LAB — CBC WITH DIFFERENTIAL/PLATELET
Basophils Absolute: 0.1 10*3/uL (ref 0.0–0.1)
Basophils Relative: 1.2 % (ref 0.0–3.0)
Eosinophils Absolute: 0.3 10*3/uL (ref 0.0–0.7)
Eosinophils Relative: 3.5 % (ref 0.0–5.0)
HCT: 38.3 % (ref 36.0–46.0)
Hemoglobin: 12.7 g/dL (ref 12.0–15.0)
Lymphocytes Relative: 24.9 % (ref 12.0–46.0)
Lymphs Abs: 1.9 10*3/uL (ref 0.7–4.0)
MCHC: 33.1 g/dL (ref 30.0–36.0)
MCV: 84 fl (ref 78.0–100.0)
Monocytes Absolute: 0.6 10*3/uL (ref 0.1–1.0)
Monocytes Relative: 7.4 % (ref 3.0–12.0)
Neutro Abs: 4.8 10*3/uL (ref 1.4–7.7)
Neutrophils Relative %: 63 % (ref 43.0–77.0)
Platelets: 322 10*3/uL (ref 150.0–400.0)
RBC: 4.56 Mil/uL (ref 3.87–5.11)
RDW: 14.8 % (ref 11.5–15.5)
WBC: 7.7 10*3/uL (ref 4.0–10.5)

## 2023-06-18 LAB — HEPATIC FUNCTION PANEL
ALT: 19 U/L (ref 0–35)
AST: 22 U/L (ref 0–37)
Albumin: 4.3 g/dL (ref 3.5–5.2)
Alkaline Phosphatase: 77 U/L (ref 39–117)
Bilirubin, Direct: 0.1 mg/dL (ref 0.0–0.3)
Total Bilirubin: 0.4 mg/dL (ref 0.2–1.2)
Total Protein: 7.2 g/dL (ref 6.0–8.3)

## 2023-06-18 LAB — TSH: TSH: 5.5 u[IU]/mL (ref 0.35–5.50)

## 2023-06-18 LAB — HEMOGLOBIN A1C: Hgb A1c MFr Bld: 6.5 % (ref 4.6–6.5)

## 2023-06-18 MED ORDER — MONTELUKAST SODIUM 10 MG PO TABS: 10.0000 mg | ORAL_TABLET | Freq: Every day | ORAL | 3 refills | Status: AC

## 2023-06-18 MED ORDER — METFORMIN HCL ER 500 MG PO TB24: 500.0000 mg | ORAL_TABLET | Freq: Every day | ORAL | 1 refills | Status: DC

## 2023-06-18 MED ORDER — LEVOTHYROXINE SODIUM 100 MCG PO TABS: 100.0000 ug | ORAL_TABLET | Freq: Every day | ORAL | 1 refills | Status: DC

## 2023-06-18 MED ORDER — ESCITALOPRAM OXALATE 20 MG PO TABS: 20.0000 mg | ORAL_TABLET | Freq: Every day | ORAL | 3 refills | Status: AC

## 2023-06-18 NOTE — Progress Notes (Signed)
 Subjective:    Patient ID: Bethany Cook, female    DOB: 01-08-1966, 58 y.o.   MRN: [id number]  Patient here for  Chief Complaint  Patient presents with   Annual Exam    HPI Here for a physical exam. Previously saw cardiology 01/2022 - zio monitor - nonsustained SVT. No afib. Breathing stable - maintained on symbicort, singulair and albuterol. Recent calcium score 138. Pravastatin dose increased. Abdominal ultrasound 202/25 - gallstones and changes c/w fatty liver. No abdominal pain. No vomiting. Eating. Up to date with colonoscopy. Had colonoscopy last Thursday - Dr Gayl Katos. Five polyps removed. Obtain report. Scheduled for mammogram and bone density 08/2023.    Past Medical History:  Diagnosis Date   Anemia    iron deficient   Asthma    Change in bowel movement 12/21/2016   Cholelithiasis 10/27/2016   Dizziness 12/26/2014   Environmental allergies    Family history of colon cancer 04/25/2013   Colonoscopy 11/18/14 - diverticulosis (sigmoid - mild).  No colorectal neoplasia.  Recommend f/u colonoscopy in five years.     Fatty liver 10/27/2016   GERD (gastroesophageal reflux disease) 04/25/2013   Health care maintenance 04/23/2014   Hypercholesterolemia    Hyperthyroidism    s/p ablation   Hypothyroidism 01/27/2012   Lung nodule 10/27/2016   Viral upper respiratory illness 05/30/2014   Past Surgical History:  Procedure Laterality Date   NO PAST SURGERIES     Family History  Problem Relation Age of Onset   Colon cancer Father    Colon cancer Paternal Uncle        x4   Asthma Other        cousine   Breast cancer Neg Hx    Social History   Socioeconomic History   Marital status: Single    Spouse name: Not on file   Number of children: 0   Years of education: Not on file   Highest education level: Not on file  Occupational History   Not on file  Tobacco Use   Smoking status: Never   Smokeless tobacco: Never  Vaping Use   Vaping status: Never Used  Substance and  Sexual Activity   Alcohol use: Yes    Alcohol/week: 0.0 standard drinks of alcohol    Comment: occasional   Drug use: No   Sexual activity: Not on file  Other Topics Concern   Not on file  Social History Narrative   Not on file   Social Drivers of Health   Financial Resource Strain: Not on file  Food Insecurity: Not on file  Transportation Needs: Not on file  Physical Activity: Not on file  Stress: Not on file  Social Connections: Not on file     Review of Systems  Constitutional:  Negative for appetite change and unexpected weight change.  HENT:  Negative for congestion, sinus pressure and sore throat.   Eyes:  Negative for pain and visual disturbance.  Respiratory:  Negative for cough, chest tightness and shortness of breath.   Cardiovascular:  Negative for chest pain, palpitations and leg swelling.  Gastrointestinal:  Negative for abdominal pain, diarrhea, nausea and vomiting.  Genitourinary:  Negative for difficulty urinating and dysuria.  Musculoskeletal:  Negative for joint swelling and myalgias.  Skin:  Negative for color change and rash.  Neurological:  Negative for dizziness and headaches.  Hematological:  Negative for adenopathy. Does not bruise/bleed easily.  Psychiatric/Behavioral:  Negative for agitation and dysphoric mood.  Objective:     BP 126/70   Pulse 71   Temp 98 F (36.7 C)   Resp 16   Ht 5\' 2"  (1.575 m)   Wt 226 lb (102.5 kg)   SpO2 98%   BMI 41.34 kg/m  Wt Readings from Last 3 Encounters:  06/18/23 226 lb (102.5 kg)  05/01/23 217 lb (98.4 kg)  12/28/22 217 lb 9.6 oz (98.7 kg)    Physical Exam Vitals reviewed.  Constitutional:      General: She is not in acute distress.    Appearance: Normal appearance.  HENT:     Head: Normocephalic and atraumatic.     Right Ear: External ear normal.     Left Ear: External ear normal.     Mouth/Throat:     Pharynx: No oropharyngeal exudate or posterior oropharyngeal erythema.  Eyes:      General: No scleral icterus.       Right eye: No discharge.        Left eye: No discharge.     Conjunctiva/sclera: Conjunctivae normal.  Neck:     Thyroid: No thyromegaly.  Cardiovascular:     Rate and Rhythm: Normal rate and regular rhythm.  Pulmonary:     Effort: No respiratory distress.     Breath sounds: Normal breath sounds. No wheezing.  Abdominal:     General: Bowel sounds are normal.     Palpations: Abdomen is soft.     Tenderness: There is no abdominal tenderness.  Genitourinary:    Comments: Sees gyn.  Musculoskeletal:        General: No swelling or tenderness.     Cervical back: Neck supple. No tenderness.  Lymphadenopathy:     Cervical: No cervical adenopathy.  Skin:    Findings: No erythema or rash.  Neurological:     Mental Status: She is alert.  Psychiatric:        Mood and Affect: Mood normal.        Behavior: Behavior normal.         Outpatient Encounter Medications as of 06/18/2023  Medication Sig   albuterol (PROVENTIL) (2.5 MG/3ML) 0.083% nebulizer solution Take 3 mLs (2.5 mg total) by nebulization every 6 (six) hours as needed for wheezing or shortness of breath.   ALPRAZolam (XANAX) 0.25 MG tablet Take 0.25 mg by mouth every 8 (eight) hours as needed.   Azelastine HCl 137 MCG/SPRAY SOLN PLACE 1 SPRAY INTO BOTH NOSTRILS 2 (TWO) TIMES DAILY. USE IN EACH NOSTRIL AS DIRECTED   budesonide-formoterol (SYMBICORT) 160-4.5 MCG/ACT inhaler Inhale 2 puffs into the lungs 2 (two) times daily.   CELEBREX 200 MG capsule    EPINEPHrine 0.3 mg/0.3 mL IJ SOAJ injection Inject into the muscle as directed.   escitalopram (LEXAPRO) 20 MG tablet Take 1 tablet (20 mg total) by mouth daily.   levocetirizine (XYZAL) 5 MG tablet Take 5 mg by mouth every evening.   levothyroxine (SYNTHROID) 100 MCG tablet Take 1 tablet (100 mcg total) by mouth daily.   metFORMIN (GLUCOPHAGE-XR) 500 MG 24 hr tablet Take 1 tablet (500 mg total) by mouth daily with breakfast.   montelukast  (SINGULAIR) 10 MG tablet Take 1 tablet (10 mg total) by mouth at bedtime.   omeprazole (PRILOSEC) 40 MG capsule TAKE 1 CAPSULE BY MOUTH DAILY   pravastatin (PRAVACHOL) 40 MG tablet Take 1 tablet (40 mg total) by mouth daily.   zolpidem (AMBIEN CR) 6.25 MG CR tablet TAKE 1 TABLET BY MOUTH AT BEDTIME AS NEEDED  FOR SLEEP   [DISCONTINUED] escitalopram (LEXAPRO) 20 MG tablet TAKE 1 TABLET BY MOUTH EVERY DAY   [DISCONTINUED] levothyroxine (SYNTHROID) 100 MCG tablet TAKE 1 TABLET BY MOUTH EVERY DAY   [DISCONTINUED] metFORMIN (GLUCOPHAGE-XR) 500 MG 24 hr tablet TAKE 1 TABLET BY MOUTH EVERY DAY WITH BREAKFAST   [DISCONTINUED] montelukast (SINGULAIR) 10 MG tablet TAKE 1 TABLET BY MOUTH AT  BEDTIME   No facility-administered encounter medications on file as of 06/18/2023.     Lab Results  Component Value Date   WBC 7.7 06/18/2023   HGB 12.7 06/18/2023   HCT 38.3 06/18/2023   PLT 322.0 06/18/2023   GLUCOSE 115 (H) 06/18/2023   CHOL 166 06/18/2023   TRIG 134.0 06/18/2023   HDL 46.00 06/18/2023   LDLDIRECT 157.7 04/21/2013   LDLCALC 93 06/18/2023   ALT 19 06/18/2023   AST 22 06/18/2023   NA 142 06/18/2023   K 4.3 06/18/2023   CL 104 06/18/2023   CREATININE 0.82 06/18/2023   BUN 9 06/18/2023   CO2 27 06/18/2023   TSH 5.50 06/18/2023   HGBA1C 6.5 06/18/2023    CT CARDIAC SCORING Addendum Date: 05/28/2023 ADDENDUM REPORT: 05/28/2023 21:57 ADDENDUM: OVER-READ INTERPRETATION  CT CHEST The following report is an over-read performed by radiologist Dr. Avanell Bob [facility] on 05/28/2023. This over-read does not include interpretation of cardiac or coronary anatomy or pathology. The interpretation by the cardiologist is attached. COMPARISON:  CT chest February 15, 2017 FINDINGS: No infiltrates or consolidations.  No pulmonary nodules. No pleural effusions. No mediastinal masses or adenopathy Subcutaneous tissues unremarkable. IMPRESSION: No acute findings. Electronically Signed   By:  Fredrich Jefferson M.D.   On: 05/28/2023 21:57   Result Date: 05/28/2023 CLINICAL DATA:  Risk stratification EXAM: Coronary Calcium Score TECHNIQUE: The patient was scanned on a Siemens Somatom scanner. Axial non-contrast 3 mm slices were carried out through the heart. The data set was analyzed on a dedicated work station and scored using the Agatson method. FINDINGS: Non-cardiac: See separate report from [facility]. Ascending Aorta: Normal size Pericardium: Normal Coronary arteries: Normal origin of left and right coronary arteries. Distribution of arterial calcifications if present, as noted below; LM 0 LAD 134 LCx 3.66 RCA 0 Total 138 IMPRESSION AND RECOMMENDATION: 1. Coronary calcium score of 138. This was 93rd percentile for age and sex matched control. 2. CAC 100-299 in LAD, LCx, RCA. CAC-DRS A2/N3. 3. Recommend aspirin and statin if no contraindication. 4. Continue heart healthy lifestyle and risk factor modification. Electronically Signed: By: Constancia Delton M.D. On: 05/10/2023 17:30   US  Abdomen Complete Result Date: 05/20/2023 CLINICAL DATA:  Right upper quadrant pain EXAM: ABDOMEN ULTRASOUND COMPLETE COMPARISON:  Ultrasound abdomen 12/17/2019 FINDINGS: Gallbladder: 8 mm stone in the gallbladder lumen. No gallbladder wall thickening or pericholecystic fluid. Negative sonographic Murphy's sign. Common bile duct: Diameter: 4 mm Liver: Increased echogenicity. No focal lesion. Portal vein is patent on color Doppler imaging with normal direction of blood flow towards the liver. IVC: No abnormality visualized. Pancreas: Visualized portion unremarkable. Spleen: Size and appearance within normal limits. Right Kidney: Length: 11.6 cm. Echogenicity within normal limits. No mass or hydronephrosis visualized. Left Kidney: Length: 10.7 cm. Echogenicity within normal limits. No mass or hydronephrosis visualized. Abdominal aorta: No aneurysm visualized. Other findings: None. IMPRESSION: 1. Cholelithiasis  without secondary signs of acute cholecystitis. 2. Increased hepatic parenchymal echogenicity suggestive of steatosis. Electronically Signed   By: Jone Neither M.D.   On: 05/20/2023 13:15  Assessment & Plan:  Health care maintenance Assessment & Plan: Physical 06/17/23.  PAP 04/13/21 - satisfactory for evaluation, endocervical/transformation zone absent. Negative for intraepithelial lesions or malignancy.   Mammogram 07/10/22- birads I. Scheduled for f/u mammogram and bone density 08/2023.  Colonoscopy 04/20/20 - tubular adenomas (4). Hyperplastic polyp.  Diverticulosis and non bleeding internal hemorrhoid. Recommended f/u colonoscopy in 3 years.  [facility]) Colonoscopy 06/14/23 - tubular adenoma (cecum) (DR Gayl Katos)   Hypothyroidism, unspecified type Assessment & Plan: On thyroid medication. Follow tsh. Recent TSH upper limits of normal. Discussed taking synthroid correctly. Check tsh in 6-8 weeks.   Orders: -     TSH; Future  Mild intermittent asthma without complication Assessment & Plan: Continue symbicort, singulair and albuterol prn. Breathing stable. No increased sob or cough. Follow.    Calculus of gallbladder without cholecystitis without obstruction Assessment & Plan: Recent ultrasound 04/2023 - gallstones and changes c/w fatty liver. No symptoms. Follow    Encounter for screening for coronary artery disease Assessment & Plan:  Recent calcium score 138. Pravastatin dose increased.    Family history of colon cancer Assessment & Plan: Colonoscopy 04/20/20 - tubular adenomas (4). Hyperplastic polyp.  Recommended f/u colonoscopy in 3 years.  [facility]).  Sees Dr Gayl Katos.  Just had f/u colonoscopy. Obtain results.    Fatty liver Assessment & Plan: Diet and exercise. Follow liver function tests.    Hypercholesterolemia Assessment & Plan: Continue pravastatin. Follow lipid panel and liver function tests.  Discussed diet and exercise. Dose just increased  given calcium score    Hyperglycemia Assessment & Plan: Low carb diet and exercise. Follow met b and A1c.   Lab Results  Component Value Date   HGBA1C 6.5 06/18/2023      OSA (obstructive sleep apnea) Assessment & Plan: Continue cpap.    Stress Assessment & Plan: Overall appears to be doing well. Continue lexapro.    Other orders -     Escitalopram Oxalate; Take 1 tablet (20 mg total) by mouth daily.  Dispense: 90 tablet; Refill: 3 -     Levothyroxine Sodium; Take 1 tablet (100 mcg total) by mouth daily.  Dispense: 90 tablet; Refill: 1 -     metFORMIN HCl ER; Take 1 tablet (500 mg total) by mouth daily with breakfast.  Dispense: 90 tablet; Refill: 1 -     Montelukast Sodium; Take 1 tablet (10 mg total) by mouth at bedtime.  Dispense: 90 tablet; Refill: 3     Dellar Fenton, MD

## 2023-06-18 NOTE — Assessment & Plan Note (Addendum)
 Physical 06/17/23.  PAP 04/13/21 - satisfactory for evaluation, endocervical/transformation zone absent. Negative for intraepithelial lesions or malignancy.   Mammogram 07/10/22- birads I. Scheduled for f/u mammogram and bone density 08/2023.  Colonoscopy 04/20/20 - tubular adenomas (4). Hyperplastic polyp.  Diverticulosis and non bleeding internal hemorrhoid. Recommended f/u colonoscopy in 3 years.  [facility]) Colonoscopy 06/14/23 - tubular adenoma (cecum) (DR Gayl Katos)

## 2023-06-23 ENCOUNTER — Encounter: Payer: Self-pay | Admitting: Internal Medicine

## 2023-06-23 NOTE — Assessment & Plan Note (Signed)
Diet and exercise.  Follow liver function tests.   

## 2023-06-23 NOTE — Assessment & Plan Note (Signed)
 Continue pravastatin. Follow lipid panel and liver function tests.  Discussed diet and exercise. Dose just increased given calcium score

## 2023-06-23 NOTE — Assessment & Plan Note (Signed)
 Colonoscopy 04/20/20 - tubular adenomas (4). Hyperplastic polyp.  Recommended f/u colonoscopy in 3 years.  [facility]).  Sees Dr Gayl Katos.  Just had f/u colonoscopy. Obtain results.

## 2023-06-23 NOTE — Assessment & Plan Note (Signed)
Overall appears to be doing well.  Continue lexapro.

## 2023-06-23 NOTE — Assessment & Plan Note (Signed)
 Continue symbicort, singulair and albuterol prn. Breathing stable. No increased sob or cough. Follow.

## 2023-06-23 NOTE — Assessment & Plan Note (Signed)
 Continue cpap.

## 2023-06-23 NOTE — Assessment & Plan Note (Signed)
 Low carb diet and exercise. Follow met b and A1c.   Lab Results  Component Value Date   HGBA1C 6.5 06/18/2023

## 2023-06-23 NOTE — Assessment & Plan Note (Signed)
 Recent ultrasound 04/2023 - gallstones and changes c/w fatty liver. No symptoms. Follow

## 2023-06-23 NOTE — Assessment & Plan Note (Addendum)
 On thyroid medication. Follow tsh. Recent TSH upper limits of normal. Discussed taking synthroid correctly. Check tsh in 6-8 weeks.

## 2023-06-23 NOTE — Assessment & Plan Note (Signed)
 Recent calcium score 138. Pravastatin dose increased.

## 2023-07-04 ENCOUNTER — Other Ambulatory Visit: Payer: Self-pay | Admitting: Pulmonary Disease

## 2023-07-05 NOTE — Telephone Encounter (Signed)
**Note De-identified  [place] Obfuscation** Please advise 

## 2023-07-28 ENCOUNTER — Other Ambulatory Visit: Payer: Self-pay | Admitting: Internal Medicine

## 2023-07-29 ENCOUNTER — Encounter: Payer: Self-pay | Admitting: Pulmonary Disease

## 2023-07-29 ENCOUNTER — Ambulatory Visit: Admitting: Pulmonary Disease

## 2023-07-29 VITALS — BP 126/75 | HR 70 | Ht 62.0 in | Wt 221.4 lb

## 2023-07-29 DIAGNOSIS — G4733 Obstructive sleep apnea (adult) (pediatric): Secondary | ICD-10-CM | POA: Diagnosis not present

## 2023-07-29 NOTE — Progress Notes (Signed)
 Bethany Cook    [id number]    Nov 30, 1965  Primary Care Physician:Scott, Urban Garden, MD  Referring Physician: Dellar Fenton, MD [address] [place],  [place] [address]  Chief complaint:   History of obstructive sleep apnea  HPI:  History of obstructive sleep apnea, used to follow-up with Dr. Ramona Burner  Was last seen in 2023  In for today's visit for follow-up Has been having some mask issues  Occasional mask leaks  Feels she has to pull the mask quite tight for her to seal well She does have a recording of some mask leak  On Ambien  which she uses possibly 2-3 times a week for insomnia  Weight has remained about the same  History of asthma, allergies  Overall feels stable  Underlying history of severe obstructive sleep apnea   Outpatient Encounter Medications as of 07/29/2023  Medication Sig   albuterol  (PROVENTIL ) (2.5 MG/3ML) 0.083% nebulizer solution Take 3 mLs (2.5 mg total) by nebulization every 6 (six) hours as needed for wheezing or shortness of breath.   ALPRAZolam (XANAX) 0.25 MG tablet Take 0.25 mg by mouth every 8 (eight) hours as needed.   Azelastine  HCl 137 MCG/SPRAY SOLN PLACE 1 SPRAY INTO BOTH NOSTRILS 2 (TWO) TIMES DAILY. USE IN EACH NOSTRIL AS DIRECTED   budesonide -formoterol (SYMBICORT) 160-4.5 MCG/ACT inhaler Inhale 2 puffs into the lungs 2 (two) times daily.   CELEBREX 200 MG capsule    EPINEPHrine 0.3 mg/0.3 mL IJ SOAJ injection Inject into the muscle as directed.   escitalopram  (LEXAPRO ) 20 MG tablet Take 1 tablet (20 mg total) by mouth daily.   levocetirizine (XYZAL) 5 MG tablet Take 5 mg by mouth every evening.   levothyroxine  (SYNTHROID ) 100 MCG tablet TAKE 1 TABLET BY MOUTH EVERY DAY   metFORMIN  (GLUCOPHAGE -XR) 500 MG 24 hr tablet Take 1 tablet (500 mg total) by mouth daily with breakfast.   montelukast  (SINGULAIR ) 10 MG tablet Take 1 tablet (10 mg total) by mouth at bedtime.   omeprazole  (PRILOSEC) 40 MG  capsule TAKE 1 CAPSULE BY MOUTH DAILY   pravastatin  (PRAVACHOL ) 40 MG tablet Take 1 tablet (40 mg total) by mouth daily.   zolpidem  (AMBIEN  CR) 6.25 MG CR tablet TAKE 1 TABLET BY MOUTH EVERY DAY AT BEDTIME AS NEEDED FOR SLEEP   No facility-administered encounter medications on file as of 07/29/2023.    Allergies as of 07/29/2023 - Review Complete 07/29/2023  Allergen Reaction Noted   Erythromycin Rash 01/27/2012    Past Medical History:  Diagnosis Date   Anemia    iron deficient   Asthma    Change in bowel movement 12/21/2016   Cholelithiasis 10/27/2016   Dizziness 12/26/2014   Environmental allergies    Family history of colon cancer 04/25/2013   Colonoscopy 11/18/14 - diverticulosis (sigmoid - mild).  No colorectal neoplasia.  Recommend f/u colonoscopy in five years.     Fatty liver 10/27/2016   GERD (gastroesophageal reflux disease) 04/25/2013   Health care maintenance 04/23/2014   Hypercholesterolemia    Hyperthyroidism    s/p ablation   Hypothyroidism 01/27/2012   Lung nodule 10/27/2016   Viral upper respiratory illness 05/30/2014    Past Surgical History:  Procedure Laterality Date   NO PAST SURGERIES      Family History  Problem Relation Age of Onset   Colon cancer Father    Colon cancer Paternal Uncle        x4   Asthma Other  cousine   Breast cancer Neg Hx     Social History   Socioeconomic History   Marital status: Single    Spouse name: Not on file   Number of children: 0   Years of education: Not on file   Highest education level: Not on file  Occupational History   Not on file  Tobacco Use   Smoking status: Never    Passive exposure: Never   Smokeless tobacco: Never  Vaping Use   Vaping status: Never Used  Substance and Sexual Activity   Alcohol use: Yes    Alcohol/week: 0.0 standard drinks of alcohol    Comment: occasional   Drug use: No   Sexual activity: Not on file  Other Topics Concern   Not on file  Social History Narrative    Not on file   Social Drivers of Health   Financial Resource Strain: Not on file  Food Insecurity: Not on file  Transportation Needs: Not on file  Physical Activity: Not on file  Stress: Not on file  Social Connections: Not on file  Intimate Partner Violence: Not on file    Review of Systems  Respiratory:  Positive for apnea.   Psychiatric/Behavioral:  Positive for sleep disturbance.     Vitals:   07/29/23 1517  BP: 126/75  Pulse: 70  SpO2: 98%     Physical Exam Constitutional:      Appearance: She is obese.  HENT:     Head: Normocephalic.     Mouth/Throat:     Mouth: Mucous membranes are moist.  Eyes:     General: No scleral icterus. Cardiovascular:     Rate and Rhythm: Normal rate and regular rhythm.     Heart sounds: No murmur heard.    No friction rub.  Pulmonary:     Effort: No respiratory distress.     Breath sounds: No stridor. No wheezing or rhonchi.  Musculoskeletal:     Cervical back: No rigidity or tenderness.  Neurological:     Mental Status: She is alert.  Psychiatric:        Mood and Affect: Mood normal.     Data Reviewed: Download from the machine reveals excellent compliance at 100% Average use of 8 hours 57 minutes 95 percentile pressure 15.9 AutoSet 8-20 Residual AHI of 11  Assessment:  Severe obstructive sleep apnea -Overall stable symptoms - AHI is elevated - Some mask issues  Sleep maintenance insomnia -Occasional use of Ambien   Allergic rhinitis - Stable  Asthma - Stable symptoms  Plan/Recommendations: With mask leaks  Will tighten CPAP pressures to 12-16  Encouraged to continue to use CPAP nightly  DME referral for trial with a new mask  Use Ambien  as needed  Follow-up in about 3 months  Encourage weight loss measures  Encouraged to call with significant concerns  Myer Artis MD [facility] 07/29/2023, 3:40 PM  CC: Dellar Fenton, MD

## 2023-07-29 NOTE — Patient Instructions (Signed)
 Follow-up in about 3 months  DME referral for pressure changes  Current pressure 5-20  New pressure setting 12-16  DME for CPAP supplies-new mask, trial with a new mask  Call us  with significant concerns  Let us  know if pressure change is not well-tolerated

## 2023-08-06 ENCOUNTER — Telehealth: Payer: Self-pay | Admitting: *Deleted

## 2023-08-06 NOTE — Telephone Encounter (Signed)
 Message reported to Research officer, political party.

## 2023-08-06 NOTE — Telephone Encounter (Addendum)
 Bethany Cook

## 2023-08-06 NOTE — Telephone Encounter (Signed)
 Called pt to apologize to her. She is doing ok and coming home today. Advised this was a mistake, we do have doctors on call for after hours or days we are closed. Pt gave verbal understanding.

## 2023-10-09 ENCOUNTER — Other Ambulatory Visit: Payer: Self-pay | Admitting: Internal Medicine

## 2023-10-18 ENCOUNTER — Ambulatory Visit: Admitting: Internal Medicine

## 2023-10-29 ENCOUNTER — Encounter: Payer: Self-pay | Admitting: Pulmonary Disease

## 2023-10-29 ENCOUNTER — Ambulatory Visit: Admitting: Pulmonary Disease

## 2023-10-29 VITALS — BP 130/75 | HR 78 | Temp 98.7°F | Ht 63.0 in | Wt 222.6 lb

## 2023-10-29 DIAGNOSIS — G4733 Obstructive sleep apnea (adult) (pediatric): Secondary | ICD-10-CM | POA: Diagnosis not present

## 2023-10-29 MED ORDER — BUDESONIDE-FORMOTEROL FUMARATE 160-4.5 MCG/ACT IN AERO: 2.0000 | INHALATION_SPRAY | Freq: Two times a day (BID) | RESPIRATORY_TRACT | 6 refills | Status: AC

## 2023-10-29 NOTE — Progress Notes (Signed)
 Bethany Cook    [id number]    Apr 20, 1965  Primary Care Physician:Scott, Allena, MD  Referring Physician: [name], MD [address] [place],  [place] [address]  Chief complaint:   History of obstructive sleep apnea  HPI:  History of obstructive sleep apnea, used to follow-up with Dr. Myrl  Was last seen in 2023  In for today's visit for follow-up Has been having some mask issues  Occasional mask leaks  Feels she has to pull the mask quite tight for her to seal well She does have a recording of some mask leak  On Ambien  which she uses possibly 2-3 times a week for insomnia  Weight has remained about the same  History of asthma, allergies  Overall feels stable  Underlying history of severe obstructive sleep apnea   Outpatient Encounter Medications as of 10/29/2023  Medication Sig  . albuterol  (PROVENTIL ) (2.5 MG/3ML) 0.083% nebulizer solution Take 3 mLs (2.5 mg total) by nebulization every 6 (six) hours as needed for wheezing or shortness of breath.  . ALPRAZolam (XANAX) 0.25 MG tablet Take 0.25 mg by mouth every 8 (eight) hours as needed.  . Azelastine  HCl 137 MCG/SPRAY SOLN PLACE 1 SPRAY INTO BOTH NOSTRILS 2 (TWO) TIMES DAILY. USE IN EACH NOSTRIL AS DIRECTED  . budesonide -formoterol  (SYMBICORT ) 160-4.5 MCG/ACT inhaler Inhale 2 puffs into the lungs 2 (two) times daily.  . CELEBREX 200 MG capsule   . EPINEPHrine 0.3 mg/0.3 mL IJ SOAJ injection Inject into the muscle as directed.  . escitalopram  (LEXAPRO ) 20 MG tablet Take 1 tablet (20 mg total) by mouth daily.  SABRA levocetirizine (XYZAL) 5 MG tablet Take 5 mg by mouth every evening.  . levothyroxine  (SYNTHROID ) 100 MCG tablet TAKE 1 TABLET BY MOUTH EVERY DAY  . metFORMIN  (GLUCOPHAGE -XR) 500 MG 24 hr tablet Take 1 tablet (500 mg total) by mouth daily with breakfast.  . montelukast  (SINGULAIR ) 10 MG tablet Take 1 tablet (10 mg total) by mouth at bedtime.  . omeprazole   (PRILOSEC) 40 MG capsule TAKE 1 CAPSULE BY MOUTH DAILY  . pravastatin  (PRAVACHOL ) 40 MG tablet Take 1 tablet (40 mg total) by mouth daily.  . zolpidem  (AMBIEN  CR) 6.25 MG CR tablet TAKE 1 TABLET BY MOUTH EVERY DAY AT BEDTIME AS NEEDED FOR SLEEP   No facility-administered encounter medications on file as of 10/29/2023.    Allergies as of 10/29/2023 - Review Complete 10/29/2023  Allergen Reaction Noted  . Erythromycin Rash 01/27/2012    Past Medical History:  Diagnosis Date  . Anemia    iron deficient  . Asthma   . Change in bowel movement 12/21/2016  . Cholelithiasis 10/27/2016  . Dizziness 12/26/2014  . Environmental allergies   . Family history of colon cancer 04/25/2013   Colonoscopy 11/18/14 - diverticulosis (sigmoid - mild).  No colorectal neoplasia.  Recommend f/u colonoscopy in five years.    . Fatty liver 10/27/2016  . GERD (gastroesophageal reflux disease) 04/25/2013  . Health care maintenance 04/23/2014  . Hypercholesterolemia   . Hyperthyroidism    s/p ablation  . Hypothyroidism 01/27/2012  . Lung nodule 10/27/2016  . Viral upper respiratory illness 05/30/2014    Past Surgical History:  Procedure Laterality Date  . NO PAST SURGERIES      Family History  Problem Relation Age of Onset  . Colon cancer Father   . Colon cancer Paternal Uncle        x4  . Asthma Other  cousine  . Breast cancer Neg Hx     Social History   Socioeconomic History  . Marital status: Single    Spouse name: Not on file  . Number of children: 0  . Years of education: Not on file  . Highest education level: Not on file  Occupational History  . Not on file  Tobacco Use  . Smoking status: Never    Passive exposure: Never  . Smokeless tobacco: Never  Vaping Use  . Vaping status: Never Used  Substance and Sexual Activity  . Alcohol use: Yes    Alcohol/week: 0.0 standard drinks of alcohol    Comment: occasional  . Drug use: No  . Sexual activity: Not on file  Other Topics  Concern  . Not on file  Social History Narrative  . Not on file   Social Drivers of Health   Financial Resource Strain: Not on file  Food Insecurity: Not on file  Transportation Needs: Not on file  Physical Activity: Not on file  Stress: Not on file  Social Connections: Not on file  Intimate Partner Violence: Not on file    Review of Systems  Respiratory:  Positive for apnea.   Psychiatric/Behavioral:  Positive for sleep disturbance.     Vitals:   10/29/23 1521  BP: 130/75  Pulse: 78  Temp: 98.7 F (37.1 C)  SpO2: 96%     Physical Exam Constitutional:      Appearance: She is obese.  HENT:     Head: Normocephalic.     Mouth/Throat:     Mouth: Mucous membranes are moist.  Eyes:     General: No scleral icterus. Cardiovascular:     Rate and Rhythm: Normal rate and regular rhythm.     Heart sounds: No murmur heard.    No friction rub.  Pulmonary:     Effort: No respiratory distress.     Breath sounds: No stridor. No wheezing or rhonchi.  Musculoskeletal:     Cervical back: No rigidity or tenderness.  Neurological:     Mental Status: She is alert.  Psychiatric:        Mood and Affect: Mood normal.     Data Reviewed: Download from the machine reveals excellent compliance at 100% Average use of 8 hours 57 minutes 95 percentile pressure 15.9 AutoSet 8-20 Residual AHI of 11  Assessment:  Severe obstructive sleep apnea -Overall stable symptoms - AHI is elevated - Some mask issues  Sleep maintenance insomnia -Occasional use of Ambien   Allergic rhinitis - Stable  Asthma - Stable symptoms  Plan/Recommendations: With mask leaks  Will tighten CPAP pressures to 12-16  Encouraged to continue to use CPAP nightly  DME referral for trial with a new mask  Use Ambien  as needed  Follow-up in about 3 months  Encourage weight loss measures  Encouraged to call with significant concerns  Jennet Epley MD [facility] 10/29/2023, 3:39 PM  CC: [name], MD

## 2023-10-29 NOTE — Patient Instructions (Signed)
 Follow-up in about 6 months  Prescription for Symbicort  sent to pharmacy for you  Call us  with significant concerns  Continue using your CPAP nightly

## 2023-10-29 NOTE — Progress Notes (Signed)
 Bethany Cook    [id number]    15-Dec-1965  Primary Care Physician:Scott, Allena, MD  Referring Physician: [name], MD [address] [place],  [place] [address]  Chief complaint:   History of obstructive sleep apnea  HPI:  History of obstructive sleep apnea, used to follow-up with Dr. Myrl  Has been doing well since her last visit  Denies any significant symptoms today  Occasional use of Ambien  for insomnia  Weight has remained about the same Trying to get more active  History of asthma, allergies  Has a history of severe obstructive sleep apnea  Outpatient Encounter Medications as of 10/29/2023  Medication Sig   albuterol  (PROVENTIL ) (2.5 MG/3ML) 0.083% nebulizer solution Take 3 mLs (2.5 mg total) by nebulization every 6 (six) hours as needed for wheezing or shortness of breath.   ALPRAZolam (XANAX) 0.25 MG tablet Take 0.25 mg by mouth every 8 (eight) hours as needed.   Azelastine  HCl 137 MCG/SPRAY SOLN PLACE 1 SPRAY INTO BOTH NOSTRILS 2 (TWO) TIMES DAILY. USE IN EACH NOSTRIL AS DIRECTED   budesonide -formoterol  (SYMBICORT ) 160-4.5 MCG/ACT inhaler Inhale 2 puffs into the lungs 2 (two) times daily.   CELEBREX 200 MG capsule    EPINEPHrine 0.3 mg/0.3 mL IJ SOAJ injection Inject into the muscle as directed.   escitalopram  (LEXAPRO ) 20 MG tablet Take 1 tablet (20 mg total) by mouth daily.   levocetirizine (XYZAL) 5 MG tablet Take 5 mg by mouth every evening.   levothyroxine  (SYNTHROID ) 100 MCG tablet TAKE 1 TABLET BY MOUTH EVERY DAY   metFORMIN  (GLUCOPHAGE -XR) 500 MG 24 hr tablet Take 1 tablet (500 mg total) by mouth daily with breakfast.   montelukast  (SINGULAIR ) 10 MG tablet Take 1 tablet (10 mg total) by mouth at bedtime.   omeprazole  (PRILOSEC) 40 MG capsule TAKE 1 CAPSULE BY MOUTH DAILY   pravastatin  (PRAVACHOL ) 40 MG tablet Take 1 tablet (40 mg total) by mouth daily.   zolpidem  (AMBIEN  CR) 6.25 MG CR tablet TAKE 1 TABLET BY  MOUTH EVERY DAY AT BEDTIME AS NEEDED FOR SLEEP   No facility-administered encounter medications on file as of 10/29/2023.    Allergies as of 10/29/2023 - Review Complete 10/29/2023  Allergen Reaction Noted   Erythromycin Rash 01/27/2012    Past Medical History:  Diagnosis Date   Anemia    iron deficient   Asthma    Change in bowel movement 12/21/2016   Cholelithiasis 10/27/2016   Dizziness 12/26/2014   Environmental allergies    Family history of colon cancer 04/25/2013   Colonoscopy 11/18/14 - diverticulosis (sigmoid - mild).  No colorectal neoplasia.  Recommend f/u colonoscopy in five years.     Fatty liver 10/27/2016   GERD (gastroesophageal reflux disease) 04/25/2013   Health care maintenance 04/23/2014   Hypercholesterolemia    Hyperthyroidism    s/p ablation   Hypothyroidism 01/27/2012   Lung nodule 10/27/2016   Viral upper respiratory illness 05/30/2014    Past Surgical History:  Procedure Laterality Date   NO PAST SURGERIES      Family History  Problem Relation Age of Onset   Colon cancer Father    Colon cancer Paternal Uncle        x4   Asthma Other        cousine   Breast cancer Neg Hx     Social History   Socioeconomic History   Marital status: Single    Spouse name: Not on file  Number of children: 0   Years of education: Not on file   Highest education level: Not on file  Occupational History   Not on file  Tobacco Use   Smoking status: Never    Passive exposure: Never   Smokeless tobacco: Never  Vaping Use   Vaping status: Never Used  Substance and Sexual Activity   Alcohol use: Yes    Alcohol/week: 0.0 standard drinks of alcohol    Comment: occasional   Drug use: No   Sexual activity: Not on file  Other Topics Concern   Not on file  Social History Narrative   Not on file   Social Drivers of Health   Financial Resource Strain: Not on file  Food Insecurity: Not on file  Transportation Needs: Not on file  Physical Activity: Not on file   Stress: Not on file  Social Connections: Not on file  Intimate Partner Violence: Not on file    Review of Systems  Respiratory:  Positive for apnea.   Psychiatric/Behavioral:  Positive for sleep disturbance.     Vitals:   10/29/23 1521  BP: 130/75  Pulse: 78  Temp: 98.7 F (37.1 C)  SpO2: 96%     Physical Exam Constitutional:      Appearance: She is obese.  HENT:     Head: Normocephalic.     Nose: Nose normal.     Mouth/Throat:     Mouth: Mucous membranes are moist.  Eyes:     General: No scleral icterus. Cardiovascular:     Rate and Rhythm: Normal rate and regular rhythm.     Heart sounds: No murmur heard.    No friction rub.  Pulmonary:     Effort: No respiratory distress.     Breath sounds: No stridor. No wheezing or rhonchi.  Musculoskeletal:     Cervical back: No rigidity or tenderness.  Neurological:     Mental Status: She is alert.  Psychiatric:        Mood and Affect: Mood normal.    Data Reviewed: CPAP download not available to be reviewed at present  Assessment:  Severe obstructive sleep apnea - Mask issues are better - Sleeping better - Good energy levels during the day  Sleep maintenance insomnia - Occasional use of Ambien   Asthma - Stable symptoms - Refills for Symbicort  sent to pharmacy   Plan/Recommendations: Continue current CPAP  Encouraged to use CPAP nightly  Ambien  as needed  Follow-up in about 6 months  Encouraged to call with significant concerns  Jennet Epley MD [facility] 10/29/2023, 3:38 PM  CC: [name], MD

## 2023-11-12 ENCOUNTER — Other Ambulatory Visit: Payer: Self-pay | Admitting: Internal Medicine

## 2023-12-20 ENCOUNTER — Other Ambulatory Visit (INDEPENDENT_AMBULATORY_CARE_PROVIDER_SITE_OTHER)

## 2023-12-20 ENCOUNTER — Ambulatory Visit (INDEPENDENT_AMBULATORY_CARE_PROVIDER_SITE_OTHER)

## 2023-12-20 ENCOUNTER — Other Ambulatory Visit: Payer: Self-pay | Admitting: Internal Medicine

## 2023-12-20 DIAGNOSIS — E7800 Pure hypercholesterolemia, unspecified: Secondary | ICD-10-CM | POA: Diagnosis not present

## 2023-12-20 DIAGNOSIS — E039 Hypothyroidism, unspecified: Secondary | ICD-10-CM | POA: Diagnosis not present

## 2023-12-20 LAB — LIPID PANEL
Cholesterol: 169 mg/dL (ref 0–200)
HDL: 50.1 mg/dL (ref >39.00)
LDL Cholesterol: 100 mg/dL — ABNORMAL HIGH (ref 0–99)
NonHDL: 119.16
Total CHOL/HDL Ratio: 3
Triglycerides: 95 mg/dL (ref 0.0–149.0)
VLDL: 19 mg/dL (ref 0.0–40.0)

## 2023-12-20 LAB — HEPATIC FUNCTION PANEL
ALT: 17 U/L (ref 0–35)
AST: 18 U/L (ref 0–37)
Albumin: 4.4 g/dL (ref 3.5–5.2)
Alkaline Phosphatase: 75 U/L (ref 39–117)
Bilirubin, Direct: 0.1 mg/dL (ref 0.0–0.3)
Total Bilirubin: 0.3 mg/dL (ref 0.2–1.2)
Total Protein: 7.3 g/dL (ref 6.0–8.3)

## 2023-12-20 LAB — BASIC METABOLIC PANEL WITH GFR
BUN: 10 mg/dL (ref 6–23)
CO2: 27 meq/L (ref 19–32)
Calcium: 9.2 mg/dL (ref 8.4–10.5)
Chloride: 104 meq/L (ref 96–112)
Creatinine, Ser: 0.76 mg/dL (ref 0.40–1.20)
GFR: 85.2 mL/min (ref >60.00)
Glucose, Bld: 101 mg/dL — ABNORMAL HIGH (ref 70–99)
Potassium: 4.1 meq/L (ref 3.5–5.1)
Sodium: 142 meq/L (ref 135–145)

## 2023-12-20 LAB — TSH: TSH: 3.82 u[IU]/mL (ref 0.35–5.50)

## 2023-12-20 NOTE — Progress Notes (Signed)
Orders placed for add on labs.  

## 2023-12-21 ENCOUNTER — Ambulatory Visit: Payer: Self-pay | Admitting: Internal Medicine

## 2023-12-23 ENCOUNTER — Ambulatory Visit (INDEPENDENT_AMBULATORY_CARE_PROVIDER_SITE_OTHER): Admitting: Internal Medicine

## 2023-12-23 ENCOUNTER — Encounter: Payer: Self-pay | Admitting: Internal Medicine

## 2023-12-23 VITALS — BP 120/80 | HR 62 | Temp 98.0°F | Resp 17 | Ht 63.0 in | Wt 222.2 lb

## 2023-12-23 DIAGNOSIS — J4520 Mild intermittent asthma, uncomplicated: Secondary | ICD-10-CM

## 2023-12-23 DIAGNOSIS — F439 Reaction to severe stress, unspecified: Secondary | ICD-10-CM | POA: Diagnosis not present

## 2023-12-23 DIAGNOSIS — G4733 Obstructive sleep apnea (adult) (pediatric): Secondary | ICD-10-CM | POA: Diagnosis not present

## 2023-12-23 DIAGNOSIS — E039 Hypothyroidism, unspecified: Secondary | ICD-10-CM

## 2023-12-23 DIAGNOSIS — E7800 Pure hypercholesterolemia, unspecified: Secondary | ICD-10-CM | POA: Diagnosis not present

## 2023-12-23 DIAGNOSIS — K8020 Calculus of gallbladder without cholecystitis without obstruction: Secondary | ICD-10-CM

## 2023-12-23 DIAGNOSIS — R739 Hyperglycemia, unspecified: Secondary | ICD-10-CM

## 2023-12-23 DIAGNOSIS — Z800 Family history of malignant neoplasm of digestive organs: Secondary | ICD-10-CM

## 2023-12-23 MED ORDER — METFORMIN HCL ER 500 MG PO TB24: 500.0000 mg | ORAL_TABLET | Freq: Every day | ORAL | 1 refills | Status: AC

## 2023-12-23 NOTE — Progress Notes (Signed)
 Subjective:    Patient ID: Bethany Cook, female    DOB: 1965-05-09, 58 y.o.   MRN: [id number]  Patient here for  Chief Complaint  Patient presents with   Medical Management of Chronic Issues    4 mth f/u    HPI Here for a scheduled follow up - follow up regarding hypertension, hypercholesterolemia and asthma.  Previously saw cardiology 01/2022 - zio monitor - nonsustained SVT. No afib. Breathing stable - maintained on symbicort , singulair  and albuterol . Recent calcium score 138. Pravastatin  dose increased. Abdominal ultrasound 3/10//25 - gallstones and changes c/w fatty liver. No abdominal pain. Continue cpap. Continues on lexapro . Overall appears to be doing well. Discussed labs.    Past Medical History:  Diagnosis Date   Anemia    iron deficient   Asthma    Change in bowel movement 12/21/2016   Cholelithiasis 10/27/2016   Dizziness 12/26/2014   Environmental allergies    Family history of colon cancer 04/25/2013   Colonoscopy 11/18/14 - diverticulosis (sigmoid - mild).  No colorectal neoplasia.  Recommend f/u colonoscopy in five years.     Fatty liver 10/27/2016   GERD (gastroesophageal reflux disease) 04/25/2013   Health care maintenance 04/23/2014   Hypercholesterolemia    Hyperthyroidism    s/p ablation   Hypothyroidism 01/27/2012   Lung nodule 10/27/2016   Viral upper respiratory illness 05/30/2014   Past Surgical History:  Procedure Laterality Date   NO PAST SURGERIES     Family History  Problem Relation Age of Onset   Colon cancer Father    Colon cancer Paternal Uncle        x4   Asthma Other        cousine   Breast cancer Neg Hx    Social History   Socioeconomic History   Marital status: Single    Spouse name: Not on file   Number of children: 0   Years of education: Not on file   Highest education level: Not on file  Occupational History   Not on file  Tobacco Use   Smoking status: Never    Passive exposure: Never   Smokeless tobacco: Never  Vaping  Use   Vaping status: Never Used  Substance and Sexual Activity   Alcohol use: Yes    Alcohol/week: 0.0 standard drinks of alcohol    Comment: occasional   Drug use: No   Sexual activity: Not on file  Other Topics Concern   Not on file  Social History Narrative   Not on file   Social Drivers of Health   Financial Resource Strain: Not on file  Food Insecurity: Not on file  Transportation Needs: Not on file  Physical Activity: Not on file  Stress: Not on file  Social Connections: Not on file     Review of Systems  Constitutional:  Negative for appetite change and unexpected weight change.  HENT:  Negative for congestion and sinus pressure.   Respiratory:  Negative for cough, chest tightness and shortness of breath.   Cardiovascular:  Negative for chest pain, palpitations and leg swelling.  Gastrointestinal:  Negative for abdominal pain, diarrhea, nausea and vomiting.  Genitourinary:  Negative for difficulty urinating and dysuria.  Musculoskeletal:  Negative for joint swelling and myalgias.  Skin:  Negative for color change and rash.  Neurological:  Negative for dizziness and headaches.  Psychiatric/Behavioral:  Negative for agitation and dysphoric mood.        Objective:     BP 120/80 (Cuff  Size: Large)   Pulse 62   Temp 98 F (36.7 C) (Oral)   Resp 17   Ht 5' 3 (1.6 m)   Wt 222 lb 4 oz (100.8 kg)   SpO2 99%   BMI 39.37 kg/m  Wt Readings from Last 3 Encounters:  12/23/23 222 lb 4 oz (100.8 kg)  10/29/23 222 lb 9.6 oz (101 kg)  07/29/23 221 lb 6.4 oz (100.4 kg)    Physical Exam Vitals reviewed.  Constitutional:      General: She is not in acute distress.    Appearance: Normal appearance.  HENT:     Head: Normocephalic and atraumatic.     Right Ear: External ear normal.     Left Ear: External ear normal.     Mouth/Throat:     Pharynx: No oropharyngeal exudate or posterior oropharyngeal erythema.  Eyes:     General: No scleral icterus.       Right eye:  No discharge.        Left eye: No discharge.     Conjunctiva/sclera: Conjunctivae normal.  Neck:     Thyroid : No thyromegaly.  Cardiovascular:     Rate and Rhythm: Normal rate and regular rhythm.  Pulmonary:     Effort: No respiratory distress.     Breath sounds: Normal breath sounds. No wheezing.  Abdominal:     General: Bowel sounds are normal.     Palpations: Abdomen is soft.     Tenderness: There is no abdominal tenderness.  Musculoskeletal:        General: No swelling or tenderness.     Cervical back: Neck supple. No tenderness.  Lymphadenopathy:     Cervical: No cervical adenopathy.  Skin:    Findings: No erythema or rash.  Neurological:     Mental Status: She is alert.  Psychiatric:        Mood and Affect: Mood normal.        Behavior: Behavior normal.         Outpatient Encounter Medications as of 12/23/2023  Medication Sig   albuterol  (PROVENTIL ) (2.5 MG/3ML) 0.083% nebulizer solution Take 3 mLs (2.5 mg total) by nebulization every 6 (six) hours as needed for wheezing or shortness of breath.   ALPRAZolam (XANAX) 0.25 MG tablet Take 0.25 mg by mouth every 8 (eight) hours as needed.   Azelastine  HCl 137 MCG/SPRAY SOLN PLACE 1 SPRAY INTO BOTH NOSTRILS 2 (TWO) TIMES DAILY. USE IN EACH NOSTRIL AS DIRECTED   budesonide -formoterol  (SYMBICORT ) 160-4.5 MCG/ACT inhaler Inhale 2 puffs into the lungs 2 (two) times daily.   CELEBREX 200 MG capsule    EPINEPHrine 0.3 mg/0.3 mL IJ SOAJ injection Inject into the muscle as directed.   escitalopram  (LEXAPRO ) 20 MG tablet Take 1 tablet (20 mg total) by mouth daily.   levocetirizine (XYZAL) 5 MG tablet Take 5 mg by mouth every evening.   levothyroxine  (SYNTHROID ) 100 MCG tablet TAKE 1 TABLET BY MOUTH DAILY   montelukast  (SINGULAIR ) 10 MG tablet Take 1 tablet (10 mg total) by mouth at bedtime.   omeprazole  (PRILOSEC) 40 MG capsule TAKE 1 CAPSULE BY MOUTH DAILY   pravastatin  (PRAVACHOL ) 40 MG tablet TAKE 1 TABLET BY MOUTH DAILY    zolpidem  (AMBIEN  CR) 6.25 MG CR tablet TAKE 1 TABLET BY MOUTH EVERY DAY AT BEDTIME AS NEEDED FOR SLEEP   [DISCONTINUED] metFORMIN  (GLUCOPHAGE -XR) 500 MG 24 hr tablet Take 1 tablet (500 mg total) by mouth daily with breakfast.   metFORMIN  (GLUCOPHAGE -XR) 500 MG 24  hr tablet Take 1 tablet (500 mg total) by mouth daily with breakfast.   No facility-administered encounter medications on file as of 12/23/2023.     Lab Results  Component Value Date   WBC 7.7 06/18/2023   HGB 12.7 06/18/2023   HCT 38.3 06/18/2023   PLT 322.0 06/18/2023   GLUCOSE 101 (H) 12/20/2023   CHOL 169 12/20/2023   TRIG 95.0 12/20/2023   HDL 50.10 12/20/2023   LDLDIRECT 157.7 04/21/2013   LDLCALC 100 (H) 12/20/2023   ALT 17 12/20/2023   AST 18 12/20/2023   NA 142 12/20/2023   K 4.1 12/20/2023   CL 104 12/20/2023   CREATININE 0.76 12/20/2023   BUN 10 12/20/2023   CO2 27 12/20/2023   TSH 3.82 12/20/2023   HGBA1C 6.5 06/18/2023    CT CARDIAC SCORING Addendum Date: 05/28/2023 ADDENDUM REPORT: 05/28/2023 21:57 ADDENDUM: OVER-READ INTERPRETATION  CT CHEST The following report is an over-read performed by radiologist Dr. Franky Puffer [facility] on 05/28/2023. This over-read does not include interpretation of cardiac or coronary anatomy or pathology. The interpretation by the cardiologist is attached. COMPARISON:  CT chest February 15, 2017 FINDINGS: No infiltrates or consolidations.  No pulmonary nodules. No pleural effusions. No mediastinal masses or adenopathy Subcutaneous tissues unremarkable. IMPRESSION: No acute findings. Electronically Signed   By: Franky Chard M.D.   On: 05/28/2023 21:57   Result Date: 05/28/2023 CLINICAL DATA:  Risk stratification EXAM: Coronary Calcium Score TECHNIQUE: The patient was scanned on a Siemens Somatom scanner. Axial non-contrast 3 mm slices were carried out through the heart. The data set was analyzed on a dedicated work station and scored using the Agatson method.  FINDINGS: Non-cardiac: See separate report from [facility]. Ascending Aorta: Normal size Pericardium: Normal Coronary arteries: Normal origin of left and right coronary arteries. Distribution of arterial calcifications if present, as noted below; LM 0 LAD 134 LCx 3.66 RCA 0 Total 138 IMPRESSION AND RECOMMENDATION: 1. Coronary calcium score of 138. This was 93rd percentile for age and sex matched control. 2. CAC 100-299 in LAD, LCx, RCA. CAC-DRS A2/N3. 3. Recommend aspirin and statin if no contraindication. 4. Continue heart healthy lifestyle and risk factor modification. Electronically Signed: By: Redell Cave M.D. On: 05/10/2023 17:30   US  Abdomen Complete Result Date: 05/20/2023 CLINICAL DATA:  Right upper quadrant pain EXAM: ABDOMEN ULTRASOUND COMPLETE COMPARISON:  Ultrasound abdomen 12/17/2019 FINDINGS: Gallbladder: 8 mm stone in the gallbladder lumen. No gallbladder wall thickening or pericholecystic fluid. Negative sonographic Murphy's sign. Common bile duct: Diameter: 4 mm Liver: Increased echogenicity. No focal lesion. Portal vein is patent on color Doppler imaging with normal direction of blood flow towards the liver. IVC: No abnormality visualized. Pancreas: Visualized portion unremarkable. Spleen: Size and appearance within normal limits. Right Kidney: Length: 11.6 cm. Echogenicity within normal limits. No mass or hydronephrosis visualized. Left Kidney: Length: 10.7 cm. Echogenicity within normal limits. No mass or hydronephrosis visualized. Abdominal aorta: No aneurysm visualized. Other findings: None. IMPRESSION: 1. Cholelithiasis without secondary signs of acute cholecystitis. 2. Increased hepatic parenchymal echogenicity suggestive of steatosis. Electronically Signed   By: Bard Moats M.D.   On: 05/20/2023 13:15       Assessment & Plan:  Stress Assessment & Plan: Continue lexapro . Appears to be doing well. Follow.    Hypercholesterolemia Assessment & Plan: Currently on  pravastatin . Cholesterol labs reviewed.  Lab Results  Component Value Date   CHOL 169 12/20/2023   HDL 50.10 12/20/2023   LDLCALC 100 (H)  12/20/2023   LDLDIRECT 157.7 04/21/2013   TRIG 95.0 12/20/2023   CHOLHDL 3 12/20/2023   Discussed changing pravastatin  to crestor 20mg  q day. Follow lipid panel.   Orders: -     Lipid panel; Future -     Basic metabolic panel with GFR; Future -     Hepatic function panel; Future -     CBC with Differential/Platelet; Future  OSA (obstructive sleep apnea) Assessment & Plan: Continue cpap. Using regularly.    Hypothyroidism, unspecified type Assessment & Plan: Continue thyroid  replacement. Follow tsh.    Hyperglycemia Assessment & Plan: Low carb diet and exercise. Follow met b and A1c.  Lab Results  Component Value Date   HGBA1C 6.5 06/18/2023      Mild intermittent asthma without complication Assessment & Plan: Continue symbicort , singulair  and albuterol  prn. No increased sob or cough. Breathing stable.    Calculus of gallbladder without cholecystitis without obstruction Assessment & Plan: Recent ultrasound 04/2023 - gallstones and changes c/w fatty liver. No symptoms. Follow    Family history of colon cancer Assessment & Plan: Colonoscopy 06/2023 - tubular adenoma (cecum) - Dr Saintclair). (Five 4-56mm polyps ileocecal valve and cecum, diverticulosis and non bleeding internal hemorrhoids).    Other orders -     metFORMIN  HCl ER; Take 1 tablet (500 mg total) by mouth daily with breakfast.  Dispense: 90 tablet; Refill: 1     Allena Hamilton, MD

## 2023-12-29 ENCOUNTER — Encounter: Payer: Self-pay | Admitting: Internal Medicine

## 2023-12-29 NOTE — Assessment & Plan Note (Signed)
Continue lexapro.  Appears to be doing well.  Follow.

## 2023-12-29 NOTE — Assessment & Plan Note (Signed)
 Continue symbicort , singulair  and albuterol  prn. No increased sob or cough. Breathing stable.

## 2023-12-29 NOTE — Assessment & Plan Note (Signed)
 Colonoscopy 06/2023 - tubular adenoma (cecum) - Dr Saintclair). (Five 4-73mm polyps ileocecal valve and cecum, diverticulosis and non bleeding internal hemorrhoids).

## 2023-12-29 NOTE — Assessment & Plan Note (Signed)
Continue thyroid replacement.  Follow tsh.   

## 2023-12-29 NOTE — Assessment & Plan Note (Signed)
 Low carb diet and exercise. Follow met b and A1c.   Lab Results  Component Value Date   HGBA1C 6.5 06/18/2023

## 2023-12-29 NOTE — Assessment & Plan Note (Signed)
 Currently on pravastatin . Cholesterol labs reviewed.  Lab Results  Component Value Date   CHOL 169 12/20/2023   HDL 50.10 12/20/2023   LDLCALC 100 (H) 12/20/2023   LDLDIRECT 157.7 04/21/2013   TRIG 95.0 12/20/2023   CHOLHDL 3 12/20/2023   Discussed changing pravastatin  to crestor 20mg  q day. Follow lipid panel.

## 2023-12-29 NOTE — Assessment & Plan Note (Signed)
 Recent ultrasound 04/2023 - gallstones and changes c/w fatty liver. No symptoms. Follow

## 2023-12-29 NOTE — Assessment & Plan Note (Signed)
Continue cpap.  Using regularly.  

## 2024-03-23 ENCOUNTER — Other Ambulatory Visit: Payer: Self-pay | Admitting: Pulmonary Disease

## 2024-04-22 ENCOUNTER — Ambulatory Visit: Admitting: Internal Medicine

## 2024-04-22 ENCOUNTER — Other Ambulatory Visit

## 2024-04-24 ENCOUNTER — Ambulatory Visit: Admitting: Internal Medicine

## 2024-07-21 ENCOUNTER — Ambulatory Visit: Admitting: Pulmonary Disease
